# Patient Record
Sex: Female | Born: 1980 | Race: White | Hispanic: No | Smoking: Former smoker
Health system: Southern US, Community
[De-identification: ages and names within clinical notes are randomized; demographics above are authoritative.]

## PROBLEM LIST (undated history)

## (undated) DIAGNOSIS — Z720 Tobacco use: Secondary | ICD-10-CM

## (undated) DIAGNOSIS — G43909 Migraine, unspecified, not intractable, without status migrainosus: Secondary | ICD-10-CM

## (undated) DIAGNOSIS — F410 Panic disorder [episodic paroxysmal anxiety] without agoraphobia: Secondary | ICD-10-CM

## (undated) HISTORY — PX: ELBOW LIGAMENT RECONSTRUCTION: SHX6359

## (undated) HISTORY — DX: Tobacco use: Z72.0

---
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
## 2001-04-18 ENCOUNTER — Other Ambulatory Visit: Admission: RE | Admit: 2001-04-18 | Discharge: 2001-04-18 | Payer: Self-pay | Admitting: Internal Medicine

## 2002-06-06 ENCOUNTER — Encounter: Payer: Self-pay | Admitting: Family Medicine

## 2002-06-06 ENCOUNTER — Other Ambulatory Visit: Admission: RE | Admit: 2002-06-06 | Discharge: 2002-06-06 | Payer: Self-pay | Admitting: Family Medicine

## 2002-06-06 LAB — CONVERTED CEMR LAB: Pap Smear: NORMAL

## 2002-10-06 ENCOUNTER — Emergency Department (HOSPITAL_COMMUNITY): Admission: EM | Admit: 2002-10-06 | Discharge: 2002-10-06 | Payer: Self-pay | Admitting: *Deleted

## 2005-01-26 ENCOUNTER — Ambulatory Visit: Payer: Self-pay | Admitting: Family Medicine

## 2005-01-30 HISTORY — PX: OTHER SURGICAL HISTORY: SHX169

## 2005-01-31 ENCOUNTER — Encounter: Admission: RE | Admit: 2005-01-31 | Discharge: 2005-01-31 | Payer: Self-pay | Admitting: Family Medicine

## 2005-02-10 ENCOUNTER — Encounter (INDEPENDENT_AMBULATORY_CARE_PROVIDER_SITE_OTHER): Payer: Self-pay | Admitting: Specialist

## 2005-02-10 ENCOUNTER — Ambulatory Visit (HOSPITAL_COMMUNITY): Admission: RE | Admit: 2005-02-10 | Discharge: 2005-02-10 | Payer: Self-pay | Admitting: Gynecology

## 2006-03-19 ENCOUNTER — Ambulatory Visit: Payer: Self-pay | Admitting: Family Medicine

## 2006-03-20 ENCOUNTER — Encounter: Payer: Self-pay | Admitting: Family Medicine

## 2006-07-17 ENCOUNTER — Telehealth (INDEPENDENT_AMBULATORY_CARE_PROVIDER_SITE_OTHER): Payer: Self-pay | Admitting: *Deleted

## 2006-10-11 ENCOUNTER — Ambulatory Visit: Payer: Self-pay | Admitting: Family Medicine

## 2006-10-11 LAB — CONVERTED CEMR LAB
Beta hcg, urine, semiquantitative: NEGATIVE
Nitrite: NEGATIVE
Urobilinogen, UA: 0.2
Yeast, UA: 0
pH: 6.5

## 2006-10-12 ENCOUNTER — Encounter: Payer: Self-pay | Admitting: Family Medicine

## 2006-10-12 LAB — CONVERTED CEMR LAB: GC Probe Amp, Genital: NEGATIVE

## 2007-05-23 ENCOUNTER — Telehealth: Payer: Self-pay | Admitting: Family Medicine

## 2007-05-24 ENCOUNTER — Ambulatory Visit: Payer: Self-pay | Admitting: Family Medicine

## 2007-05-24 ENCOUNTER — Encounter (INDEPENDENT_AMBULATORY_CARE_PROVIDER_SITE_OTHER): Payer: Self-pay | Admitting: *Deleted

## 2007-05-31 LAB — CONVERTED CEMR LAB
ALT: 10 units/L (ref 0–35)
AST: 15 units/L (ref 0–37)
Basophils Relative: 1 % (ref 0–1)
Bilirubin, Direct: 0.1 mg/dL (ref 0.0–0.3)
Chloride: 104 meq/L (ref 96–112)
Creatinine, Ser: 0.82 mg/dL (ref 0.40–1.20)
Eosinophils Relative: 2 % (ref 0–5)
Glucose, Bld: 73 mg/dL (ref 70–99)
Hemoglobin: 15.7 g/dL — ABNORMAL HIGH (ref 12.0–15.0)
Lymphs Abs: 2.1 10*3/uL (ref 0.7–4.0)
MCV: 96.3 fL (ref 78.0–100.0)
Potassium: 5.1 meq/L (ref 3.5–5.3)
RDW: 13 % (ref 11.5–15.5)
Total Protein: 7.8 g/dL (ref 6.0–8.3)

## 2007-07-25 ENCOUNTER — Ambulatory Visit: Payer: Self-pay | Admitting: Family Medicine

## 2007-07-25 DIAGNOSIS — M6740 Ganglion, unspecified site: Secondary | ICD-10-CM | POA: Insufficient documentation

## 2007-09-26 ENCOUNTER — Ambulatory Visit: Payer: Self-pay | Admitting: Gynecology

## 2008-04-28 ENCOUNTER — Emergency Department (HOSPITAL_COMMUNITY): Admission: EM | Admit: 2008-04-28 | Discharge: 2008-04-28 | Payer: Self-pay | Admitting: Emergency Medicine

## 2008-06-30 ENCOUNTER — Ambulatory Visit: Payer: Self-pay | Admitting: Family Medicine

## 2008-06-30 DIAGNOSIS — F341 Dysthymic disorder: Secondary | ICD-10-CM | POA: Insufficient documentation

## 2009-08-23 ENCOUNTER — Ambulatory Visit: Payer: Self-pay | Admitting: Family Medicine

## 2009-08-23 LAB — CONVERTED CEMR LAB
Beta hcg, urine, semiquantitative: NEGATIVE
Bilirubin Urine: NEGATIVE
Ketones, urine, test strip: NEGATIVE

## 2009-08-24 ENCOUNTER — Encounter: Payer: Self-pay | Admitting: Family Medicine

## 2009-08-25 ENCOUNTER — Telehealth: Payer: Self-pay | Admitting: Family Medicine

## 2009-09-02 ENCOUNTER — Telehealth: Payer: Self-pay | Admitting: Family Medicine

## 2009-09-20 ENCOUNTER — Telehealth: Payer: Self-pay | Admitting: Family Medicine

## 2009-09-22 ENCOUNTER — Emergency Department (HOSPITAL_COMMUNITY): Admission: EM | Admit: 2009-09-22 | Discharge: 2009-09-22 | Payer: Self-pay | Admitting: Emergency Medicine

## 2009-09-22 ENCOUNTER — Telehealth: Payer: Self-pay | Admitting: Family Medicine

## 2010-02-16 ENCOUNTER — Encounter: Payer: Self-pay | Admitting: Family Medicine

## 2010-02-16 ENCOUNTER — Ambulatory Visit
Admission: RE | Admit: 2010-02-16 | Discharge: 2010-02-16 | Payer: Self-pay | Source: Home / Self Care | Attending: Family Medicine | Admitting: Family Medicine

## 2010-02-16 DIAGNOSIS — N390 Urinary tract infection, site not specified: Secondary | ICD-10-CM | POA: Insufficient documentation

## 2010-02-16 DIAGNOSIS — M549 Dorsalgia, unspecified: Secondary | ICD-10-CM | POA: Insufficient documentation

## 2010-02-16 LAB — CONVERTED CEMR LAB
Beta hcg, urine, semiquantitative: NEGATIVE
Casts: 0 /lpf
Nitrite: POSITIVE
Protein, U semiquant: 30
Urobilinogen, UA: 0.2
Yeast, UA: 0
pH: 6

## 2010-02-20 ENCOUNTER — Encounter: Payer: Self-pay | Admitting: Family Medicine

## 2010-02-28 ENCOUNTER — Ambulatory Visit
Admission: RE | Admit: 2010-02-28 | Discharge: 2010-02-28 | Payer: Self-pay | Source: Home / Self Care | Attending: Family Medicine | Admitting: Family Medicine

## 2010-02-28 ENCOUNTER — Encounter: Payer: Self-pay | Admitting: Family Medicine

## 2010-02-28 ENCOUNTER — Telehealth: Payer: Self-pay | Admitting: Family Medicine

## 2010-02-28 LAB — CONVERTED CEMR LAB
Bilirubin Urine: NEGATIVE
Ketones, urine, test strip: NEGATIVE
Specific Gravity, Urine: 1.015
Urobilinogen, UA: 0.2
WBC Urine, dipstick: NEGATIVE
pH: 6
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
## 2010-03-01 NOTE — Progress Notes (Signed)
Summary: still in pain, still bleeding  Phone Note Refill Request Call back at Home Phone [phone] Message from:  Patient on September 02, 2009 10:59 AM  Refills Requested: Medication #1:  VICODIN 5-500 MG TABS 1-2 by mouth up to every 4 hours as needed pain Patient says that she is still on her period and she is still having the pain and is out of pain medication. She is aslo very concerned that she is still bleeding because it has been going on for over a week. Please advise. [facility] [address]   Initial call taken by: Melody Comas,  September 02, 2009 10:59 AM Caller: Patient Call For: Judith Part MD  Follow-up for Phone Call        I still do not have ultrasound report -- that will help me I gave her a refil on vicodin -- did she use that? (gaver 30 with one refil on 7/27) Follow-up by: Judith Part MD,  September 02, 2009 12:59 PM  Additional Follow-up for Phone Call Additional follow up Details #1::        I called [place] [facility] with Dondra Spry. Dondra Spry said pt did not get [place] on Fri 08/24/09. I called pt and pt said she was told at [facility] could not do pelvic ultrasound if bleeding so pt did not have US  done and it was not rescheduled yet.  Pt said since the 08/25/09 she has been taking 2 Vicodin every 3 1/2 to 4 hrs because the pain is so bad. Pt has 4 pills left out of the 60 called in on 08/25/09. Pt said sharp pain  still in Lt side of lower back. I spoke with Dondra Spry at [facility] imaging and she said tech usually leaves it up to pt if bleeding if they want to get [place] or come back in few days after finish period. Please advise. Lewanda Rife LPN  September 02, 2009 2:01 PM     Additional Follow-up for Phone Call Additional follow up Details #2::    please let her know I really need to get the ultrasound when able to see what is going on please have her re schedule as soon as her period stops / or I can directly ref her to gyn if pain is that bad  px written on EMR for call in  do not  take vicodin more than every  4 hours- that can be toxic to the liver Follow-up by: Judith Part MD,  September 02, 2009 4:00 PM  Additional Follow-up for Phone Call Additional follow up Details #3:: Details for Additional Follow-up Action Taken: Patient notified as instructed via telephone.  [facility]   Additional Follow-up by: Linde Gillis CMA Duncan Dull),  September 02, 2009 4:11 PM  Prescriptions: VICODIN 5-500 MG TABS (HYDROCODONE-ACETAMINOPHEN) 1-2 by mouth up to every 4 hours as needed pain  #60 x 0   Entered and Authorized by:   Judith Part MD   Signed by:   Linde Gillis CMA (AAMA) on 09/02/2009   Method used:   Telephoned to ...       [facility]  [address] [id number]       [address]       [place], [place]  [address]       Ph: [phone]       Fax: [phone]  RxID:   [id number]

## 2010-03-01 NOTE — Assessment & Plan Note (Signed)
Summary: PAIN ON LEFT SIDE NEAR PELVIS/CLE   Vital Signs:  Patient profile:   30 year old female Height:      62.50 inches Weight:      133.75 pounds BMI:     24.16 Temp:     98 degrees F oral Pulse rate:   80 / minute Pulse rhythm:   regular BP sitting:   108 / 76  (left arm) Cuff size:   regular  Vitals Entered By: Lewanda Rife LPN (August 23, 2009 11:56 AM) CC: Pain in lt lower side and low back pain (slight). Pt pain level now is 9. Pt's LMP 08/05/09 but pt has been trying to get pregnant.   History of Present Illness: pain started late friday night  started hurting in L pelvic area  took ibuprofen and tylenol- not much help worse this am, uncomfortable to wear her jeans   not on birth control - is trying to get pregnant this mo menses was from the 7-12 th   has hx of ovarian cyst - had surgical removal Dr Maebelle Munroe -- was the L side  did save the ovary  that pain was quite a bit worse  no vaginal bleeding  no urinary symptoms - no dysuria or odor or blood , no frequency   never had a kidney stone in the past  pain does radiate into her lower back   no chance STD at all   no vag d/c or itch or burn   Allergies (verified): No Known Drug Allergies  Past History:  Past Surgical History: Last updated: 10/11/2006 Cystic ovarian mass (01/2005)  Family History: Last updated: 10/11/2006 Father: HTN Mother:  Siblings: 2 sisters  Social History: Last updated: 07/25/2007 Current Smoker Marital Status: Married Children:  Occupation: hairdresser  Risk Factors: Smoking Status: current (10/11/2006)  Review of Systems General:  Complains of fatigue; denies chills, fever, loss of appetite, and malaise. Eyes:  Denies blurring and eye irritation. CV:  Denies chest pain or discomfort and palpitations. Resp:  Denies cough and wheezing. GI:  Denies bloody stools, change in bowel habits, diarrhea, hemorrhoids, indigestion, loss of appetite, nausea, and  vomiting. Derm:  Denies poor wound healing and rash. Endo:  Denies excessive thirst and excessive urination. Heme:  Denies abnormal bruising and bleeding.  Physical Exam  General:  uncomfortable but well appearing Head:  normocephalic, atraumatic, and no abnormalities observed.   Eyes:  vision grossly intact, pupils equal, pupils round, and pupils reactive to light.   Neck:  No deformities, masses, or tenderness noted. Lungs:  Normal respiratory effort, chest expands symmetrically. Lungs are clear to auscultation, no crackles or wheezes. Heart:  Normal rate and regular rhythm. S1 and S2 normal without gallop, murmur, click, rub or other extra sounds. Abdomen:  tender over L pelvic area - without rebound or gaurding or rigidity no M noted  soft, normal bowel sounds, no hepatomegaly, and no splenomegaly.   Msk:  mild L cva tenderness  Extremities:  No clubbing, cyanosis, edema, or deformity noted with normal full range of motion of all joints.   Skin:  Intact without suspicious lesions or rashes Cervical Nodes:  No lymphadenopathy noted Inguinal Nodes:  No significant adenopathy Psych:  nl affect    Impression & Recommendations:  Problem # 1:  PELVIC PAIN, LEFT (ICD-789.09) Assessment New new pelvic pain with remote hx of large ov cyst that was removed sugically also pos ua for infx , preg test is neg  ref asap for pelvic  US  vicodin for pain  tx poss uti with cipro and cx urine  adv to update me if any worse esp if fever or inc pain  Her updated medication list for this problem includes:    Ibuprofen 200 Mg Tabs (Ibuprofen) ..... Otc as directed.    Vicodin 5-500 Mg Tabs (Hydrocodone-acetaminophen) .Marland Kitchen... 1 by mouth up to every 4 hours as needed pain  Orders: Radiology Referral (Radiology) Urine Pregnancy Test  (04540) UA Dipstick W/ Micro (manual) (98119)  Problem # 2:  UTI (ICD-599.0) Assessment: New pos ua without typical symptoms tx with high dose cipro in light of  back pain  ucx pend  if [place] is neg and pain not imp consider Ct to look for kidney stone Her updated medication list for this problem includes:    Cipro 500 Mg Tabs (Ciprofloxacin hcl) .Marland Kitchen... 1 by mouth two times a day for 7 days  Orders: T-Culture, Urine ([id number]) Specimen Handling (30865) Urine Pregnancy Test  (78469) UA Dipstick W/ Micro (manual) (81000)  Complete Medication List: 1)  Sertraline Hcl 25 Mg Tabs (Sertraline hcl) .... 1/2 tab by mouth once daily for 6 days, then whole tab for 6 days, then 2 tabs a day. 2)  Sertraline Hcl 50 Mg Tabs (Sertraline hcl) .... One tab by mouth once daily 3)  Ibuprofen 200 Mg Tabs (Ibuprofen) .... Otc as directed. 4)  Vicodin 5-500 Mg Tabs (Hydrocodone-acetaminophen) .Marland Kitchen.. 1 by mouth up to every 4 hours as needed pain 5)  Cipro 500 Mg Tabs (Ciprofloxacin hcl) .Marland Kitchen.. 1 by mouth two times a day for 7 days  Patient Instructions: 1)  we will refer you for pelvic ultrasound asap - at check out  2)  if your symptoms worsen- please call 3)  drink water 4)  take the abx - cipro - as directed  5)  also use vicodin with caution as it is narcotic and can sedate  Prescriptions: CIPRO 500 MG TABS (CIPROFLOXACIN HCL) 1 by mouth two times a day for 7 days  #14 x 0   Entered and Authorized by:   Judith Part MD   Signed by:   Judith Part MD on 08/23/2009   Method used:   Print then Give to Patient   RxID:   [id number] VICODIN 5-500 MG TABS (HYDROCODONE-ACETAMINOPHEN) 1 by mouth up to every 4 hours as needed pain  #20 x 0   Entered and Authorized by:   Judith Part MD   Signed by:   Judith Part MD on 08/23/2009   Method used:   Print then Give to Patient   RxID:   [id number]   Current Allergies (reviewed today): No known allergies   Laboratory Results   Urine Tests  Date/Time Received: August 23, 2009 11:58 AM  Date/Time Reported: August 23, 2009 11:58 AM   Routine Urinalysis   Color: yellow Appearance: Hazy Glucose:  negative   (Normal Range: Negative) Bilirubin: negative   (Normal Range: Negative) Ketone: negative   (Normal Range: Negative) Spec. Gravity: 1.015   (Normal Range: 1.003-1.035) Blood: trace-intact   (Normal Range: Negative) pH: 5.0   (Normal Range: 5.0-8.0) Protein: trace   (Normal Range: Negative) Urobilinogen: 0.2   (Normal Range: 0-1) Nitrite: positive   (Normal Range: Negative) Leukocyte Esterace: trace   (Normal Range: Negative)  Urine Microscopic WBC/HPF: 3-4 RBC/HPF: 1-3 Bacteria/HPF: mild Mucous/HPF: few Epithelial/HPF: 1-2 Crystals/HPF: 0 Casts/LPF: 0 Yeast/HPF: 0 Other: 0    Urine HCG: negative

## 2010-03-01 NOTE — Progress Notes (Signed)
Summary: hydrocodone not helping   Phone Note Call from Patient Call back at Home Phone [phone]   Caller: Patient Call For: Judith Part MD Summary of Call:  Patient says that the hydrocodone is not helping with her pain that she was seen for on the 25th. She is asking if she can have [facility] [address]  Initial call taken by: Melody Comas,  August 25, 2009 10:45 AM  Follow-up for Phone Call        have her try taking 2 instead of 1 up to every 4 hours (watch for sedation)- update me if not helpful  let me know if and when she needs refil as well could you please call for her pelvic US report? -I think she may have had it 7/26 at [facility]  let her know that prelim urine cx pos (no sens yet)-so keep taking the cipro also  Follow-up by: Judith Part MD,  August 25, 2009 12:08 PM  Additional Follow-up for Phone Call Additional follow up Details #1::        Patient notified as instructed by telephone. Pt will need [facility] rx [facility] [address] for [facility]one. Pt reschedule pelvic US for this Fri,08/27/09.Lewanda Rife LPN  August 25, 2009 12:14 PM     Additional Follow-up for Phone Call Additional follow up Details #2::    thanks px written on EMR for call in  Follow-up by: Judith Part MD,  August 25, 2009 12:18 PM  Additional Follow-up for Phone Call Additional follow up Details #3:: Details for Additional Follow-up Action Taken: [facility] as instructed. Lewanda Rife LPN  August 25, 2009 1:00 PM   New/Updated Medications: VICODIN 5-500 MG TABS (HYDROCODONE-ACETAMINOPHEN) 1-2 by mouth up to every 4 hours as needed pain Prescriptions: VICODIN 5-500 MG TABS (HYDROCODONE-ACETAMINOPHEN) 1-2 by mouth up to every 4 hours as needed pain  #30 x 1   Entered and Authorized by:   Judith Part MD   Signed by:   Lewanda Rife LPN on 47/82/9562   Method used:   Telephoned to ...         RxID:   [id number]

## 2010-03-01 NOTE — Progress Notes (Signed)
  Phone Note From Other Clinic   Summary of Call: Called patient and set her up an appt today with Dr Shawnie Pons today at 10:45am. Patient was a No-Show for this appt.  Their office will call the patient because she already had an appt on Sept  1st which  cynthia had made not knowing that I was already working on it.  I tried to call the patient back but their was no answer.  Initial call taken by: Carlton Adam,  September 22, 2009 2:32 PM  Follow-up for Phone Call        just do your best to contact her - thanks  Follow-up by: Judith Part MD,  September 22, 2009 4:21 PM

## 2010-03-02 ENCOUNTER — Encounter (INDEPENDENT_AMBULATORY_CARE_PROVIDER_SITE_OTHER): Payer: Self-pay | Admitting: *Deleted

## 2010-03-03 NOTE — Assessment & Plan Note (Signed)
Summary: side pain/alc   Vital Signs:  Patient profile:   30 year old female Weight:      132.25 pounds BMI:     23.89 Temp:     99.5 degrees F oral Pulse rate:   88 / minute Pulse rhythm:   regular BP sitting:   122 / 82  (left arm) Cuff size:   regular  Vitals Entered By: Selena Batten Dance CMA (AAMA) (February 16, 2010 3:05 PM) CC: Back and side pain x2 weeks Comments Hurts with movements, deep breaths, coughing and sneezing. OTC meds no help.    History of Present Illness: has had 2 weeks of symptoms  started with some pain in low back both sides  this radiates up to her ribs on both sides  hurts really bad and worse to take a deep breath  never had this happen before   no fever   has been taking otc 800 mg ibuprofen and tylenol and advil and tylenol pm  ibuprofen helps just a little - not a lot    crying a lot due to pain  head feels hot in general  also headaches -- migraines   no n/v no pelvic pain   last summer ended up seeing GYN -- and having CT or MRI - told she had "air bubbles " in colon  never found out why she was bleeding so much   menses have been fairly regular  occ irreg if really stressed  no missed peroids  last month was on the 20th -- is about due  no longer trying to get pregnant  has used condoms every time -- and not sexually active this month (split with her boyfrien)  has cut down to 5 cig per day  Allergies (verified): No Known Drug Allergies  Past History:  Past Surgical History: Last updated: 10/11/2006 Cystic ovarian mass (01/2005)  Family History: Last updated: 10/11/2006 Father: HTN Mother:  Siblings: 2 sisters  Social History: Last updated: 07/25/2007 Current Smoker Marital Status: Married Children:  Occupation: hairdresser  Risk Factors: Smoking Status: current (10/11/2006)  Past Medical History: tab abuse   Review of Systems General:  Complains of fatigue; denies chills and fever. Eyes:  Denies blurring. CV:   Denies chest pain or discomfort, palpitations, and shortness of breath with exertion. Resp:  Denies cough and wheezing. GI:  Complains of abdominal pain and nausea; denies bloody stools, change in bowel habits, dark tarry stools, indigestion, and vomiting. GU:  Denies dysuria and hematuria. MS:  Complains of low back pain. Derm:  Denies itching and rash. Neuro:  Denies headaches. Heme:  Denies abnormal bruising and bleeding.  Physical Exam  General:  healthy appearing but tearful from pain at times  moving slowly  Head:  normocephalic, atraumatic, and no abnormalities observed.   Eyes:  vision grossly intact, pupils equal, pupils round, and pupils reactive to light.   Mouth:  pharynx pink and moist.   Neck:  nl rom neck no bony tenderness  Chest Wall:  tender over bilat ant and lat chest wall - even to light palpation Lungs:  Normal respiratory effort, chest expands symmetrically. Lungs are clear to auscultation, no crackles or wheezes. pt winces in pain when she takes medium to deep breaths  Heart:  Normal rate and regular rhythm. S1 and S2 normal without gallop, murmur, click, rub or other extra sounds. Abdomen:  Bowel sounds positive,abdomen soft and non-tender without masses, organomegaly or hernias noted. pt is gaurding a bit on exam -  afraid I will touch her ribs  Msk:  very tender CW tender over entire LS and lower TS as well as surrounding musculature  tender cva - jumps to be touched  slr causes low back and hip pain  hip rotaton does the same Extremities:  No clubbing, cyanosis, edema, or deformity noted with normal full range of motion of all joints.   Neurologic:  sensation intact to light touch, gait normal, and DTRs symmetrical and normal.  no tremor   Skin:  Intact without suspicious lesions or rashes Cervical Nodes:  No lymphadenopathy noted Inguinal Nodes:  No significant adenopathy Psych:  pt seems somewhat anxious  also tearful from pain   Impression &  Recommendations:  Problem # 1:  BACK PAIN (ICD-724.5) Assessment New  bilateral low back pain that is radiating up into flank and rib areas with pleuritic component  some component of muscle spasm and also poss from uti  will tx with flexeril and also tx uti and update The following medications were removed from the medication list:    Vicodin 5-500 Mg Tabs (Hydrocodone-acetaminophen) .Marland Kitchen... 1-2 by mouth up to every 4 hours as needed pain Her updated medication list for this problem includes:    Ibuprofen 200 Mg Tabs (Ibuprofen) ..... Otc as directed.    Flexeril 10 Mg Tabs (Cyclobenzaprine hcl) .Marland Kitchen... 1 by mouth three times a day as needed back pain  caution- can sedate  Orders: UA Dipstick w/Micro (automated) (81001) Specimen Handling (16109)  Problem # 2:  UTI (ICD-599.0) Assessment: New with pos ua and low back and flank pain- but no urinary symptoms  also no gyn d/c or other symptoms  upreg neg as well tx with high dose cipro and update  The following medications were removed from the medication list:    Cipro 500 Mg Tabs (Ciprofloxacin hcl) .Marland Kitchen... 1 by mouth two times a day for 7 days Her updated medication list for this problem includes:    Cipro 500 Mg Tabs (Ciprofloxacin hcl) .Marland Kitchen... 1 by mouth two times a day for 7 days  Orders: UA Dipstick w/Micro (automated) (81001) T-Culture, Urine ([id number]) Specimen Handling (91478)  Complete Medication List: 1)  Ibuprofen 200 Mg Tabs (Ibuprofen) .... Otc as directed. 2)  Cipro 500 Mg Tabs (Ciprofloxacin hcl) .Marland Kitchen.. 1 by mouth two times a day for 7 days 3)  Flexeril 10 Mg Tabs (Cyclobenzaprine hcl) .Marland Kitchen.. 1 by mouth three times a day as needed back pain  caution- can sedate  Patient Instructions: 1)  continue drinking lots of water 2)  call or seek care is symptoms don't improve in 2-3 days or if you develop back pain, nausea, or vomiting  3)  take the cipro as directed  4)  call in 5-7 days to let me know how you are doing 5)  if  worse , call earlier  Prescriptions: FLEXERIL 10 MG TABS (CYCLOBENZAPRINE HCL) 1 by mouth three times a day as needed back pain  caution- can sedate  #30 x 0   Entered and Authorized by:   Judith Part MD   Signed by:   Judith Part MD on 02/16/2010   Method used:   [facility]        [facility] [facility]       [address]       [place], [place]  [address]       Ph: [phone] or [phone]       Fax: [phone]  RxID:   [id number] CIPRO 500 MG TABS (CIPROFLOXACIN HCL) 1 by mouth two times a day for 7 days  #14 x 0   Entered and Authorized by:   Judith Part MD   Signed by:   Judith Part MD on 02/16/2010   Method used:   [facility] to        [facility] [id number]       [address]       [place], [place]  [address]       Ph: [phone] or [phone]       Fax: [phone]   RxID:   [id number]    Orders Added: 1)  UA Dipstick w/Micro (automated) [81001] 2)  T-Culture, Urine [[id number]] 3)  Specimen Handling [99000] 4)  Est. Patient Level III [66063]    Current Allergies (reviewed today): No known allergies   Laboratory Results   Urine Tests  Date/Time Received: February 16, 2010 3:35 PM  Date/Time Reported: February 16, 2010 3:35 PM   Routine Urinalysis   Color: yellow Appearance: Cloudy Glucose: negative   (Normal Range: Negative) Bilirubin: negative   (Normal Range: Negative) Ketone: negative   (Normal Range: Negative) Spec. Gravity: 1.010   (Normal Range: 1.003-1.035) Blood: moderate   (Normal Range: Negative) pH: 6.0   (Normal Range: 5.0-8.0) Protein: 30   (Normal Range: Negative) Urobilinogen: 0.2   (Normal Range: 0-1) Nitrite: positive   (Normal Range: Negative) Leukocyte Esterace: moderate   (Normal Range: Negative)  Urine Microscopic WBC/HPF: many RBC/HPF: 2-3 Bacteria/HPF: many Mucous/HPF: few Epithelial/HPF: 0-3 Crystals/HPF: 0 Casts/LPF: 0 Yeast/HPF: 0 Other:  0    Urine HCG: negative

## 2010-03-09 NOTE — Progress Notes (Signed)
Summary: pain is not any better  Phone Note Call from Patient Call back at [phone]   Caller: Patient Call For: Judith Part MD Summary of Call: Pt was seen last week for side and back pain.  She is still having the pain, not any better.  She gets some relief from muscle relaxors but she cant take these during the day because they make her sleepy.  She has finished the antibiotic.  Please advise on what she can do. Initial call taken by: Lowella Petties CMA, AAMA,  February 28, 2010 9:47 AM  Follow-up for Phone Call        first and foremost I want to re check her ua -- please drop off specimen today and I will give further instructions  thanks Follow-up by: Judith Part MD,  February 28, 2010 10:50 AM  Additional Follow-up for Phone Call Additional follow up Details #1::        Left message for patient to call back. Lewanda Rife LPN  February 28, 2010 1:12 PM   Patient returned call and will come in this afternoon to give urine specimen.  Melody Comas  February 28, 2010 1:32 PM  see lab note 02/28/10 as nurse visit.Lewanda Rife LPN  February 28, 2010 5:16 PM

## 2010-03-09 NOTE — Letter (Signed)
Summary: Results Follow up Letter  [place] [facility]  [address] [place], [place] [address]   Phone: [phone]  Fax: [phone]    03/02/2010 MRN: [id number]  BREANDA GREENLAW [facility] [address] [place], [place]  [address]  Dear Ms. MOORE,  The following are the results of your recent test(s):  Test         Result    Pap Smear:        Normal _____  Not Normal _____ Comments: ______________________________________________________ Cholesterol: LDL(Bad cholesterol):         Your goal is less than:         HDL (Good cholesterol):       Your goal is more than: Comments:  ______________________________________________________ Mammogram:        Normal _____  Not Normal _____ Comments:  ___________________________________________________________________ Hemoccult:        Normal _____  Not normal _______ Comments:    _____________________________________________________________________ Other Tests: Your urine culture came back negative. Please follow up when you are able if the abdominal pain continues. Also, please contact our office with an updated contact number for you. All of the numbers we have in our system are incorrect so we have no way to call you should the need arise. Thanks!    We routinely do not discuss normal results over the telephone.  If you desire a copy of the results, or you have any questions about this information we can discuss them at your next office visit.   Sincerely,       Sharilyn Sites for Dr. Roxy Manns

## 2010-04-15 LAB — COMPREHENSIVE METABOLIC PANEL
Albumin: 4.1 g/dL (ref 3.5–5.2)
BUN: 7 mg/dL (ref 6–23)
CO2: 26 mEq/L (ref 19–32)
Chloride: 103 mEq/L (ref 96–112)
Creatinine, Ser: 0.91 mg/dL (ref 0.4–1.2)
Total Bilirubin: 0.2 mg/dL — ABNORMAL LOW (ref 0.3–1.2)
Total Protein: 7.3 g/dL (ref 6.0–8.3)

## 2010-04-15 LAB — CBC
HCT: 45 % (ref 36.0–46.0)
HCT: 45.1 % (ref 36.0–46.0)
Hemoglobin: 15.8 g/dL — ABNORMAL HIGH (ref 12.0–15.0)
Hemoglobin: 15.9 g/dL — ABNORMAL HIGH (ref 12.0–15.0)
MCH: 33.5 pg (ref 26.0–34.0)
MCV: 94.9 fL (ref 78.0–100.0)
MCV: 95.1 fL (ref 78.0–100.0)
RBC: 4.75 MIL/uL (ref 3.87–5.11)
RDW: 12.7 % (ref 11.5–15.5)
WBC: 3.2 10*3/uL — ABNORMAL LOW (ref 4.0–10.5)
WBC: 3.2 10*3/uL — ABNORMAL LOW (ref 4.0–10.5)

## 2010-04-15 LAB — LIPASE, BLOOD: Lipase: 19 U/L (ref 11–59)

## 2010-04-15 LAB — URINALYSIS, ROUTINE W REFLEX MICROSCOPIC
Glucose, UA: NEGATIVE mg/dL
Protein, ur: NEGATIVE mg/dL
Specific Gravity, Urine: 1.037 — ABNORMAL HIGH (ref 1.005–1.030)

## 2010-04-15 LAB — POCT URINALYSIS DIPSTICK
Glucose, UA: NEGATIVE mg/dL
Hgb urine dipstick: NEGATIVE
Specific Gravity, Urine: 1.025 (ref 1.005–1.030)
Urobilinogen, UA: 0.2 mg/dL (ref 0.0–1.0)

## 2010-04-15 LAB — DIFFERENTIAL
Eosinophils Absolute: 0 10*3/uL (ref 0.0–0.7)
Eosinophils Absolute: 0 10*3/uL (ref 0.0–0.7)
Eosinophils Relative: 1 % (ref 0–5)
Eosinophils Relative: 1 % (ref 0–5)
Lymphocytes Relative: 36 % (ref 12–46)
Lymphocytes Relative: 39 % (ref 12–46)
Lymphs Abs: 1.2 10*3/uL (ref 0.7–4.0)
Lymphs Abs: 1.3 10*3/uL (ref 0.7–4.0)
Monocytes Absolute: 0.5 10*3/uL (ref 0.1–1.0)
Monocytes Relative: 13 % — ABNORMAL HIGH (ref 3–12)
Neutro Abs: 1.4 10*3/uL — ABNORMAL LOW (ref 1.7–7.7)

## 2010-04-15 LAB — POCT I-STAT, CHEM 8
Glucose, Bld: 77 mg/dL (ref 70–99)
HCT: 52 % — ABNORMAL HIGH (ref 36.0–46.0)
Hemoglobin: 17.7 g/dL — ABNORMAL HIGH (ref 12.0–15.0)
Potassium: 4.1 mEq/L (ref 3.5–5.1)
Sodium: 139 mEq/L (ref 135–145)
TCO2: 28 mmol/L (ref 0–100)

## 2010-06-14 NOTE — Assessment & Plan Note (Signed)
NAME:  CAREL, SCHNEE             ACCOUNT NO.:  [id number]   MEDICAL RECORD NO.:  [id number]          PATIENT TYPE:  POB   LOCATION:  [facility] at [facility]         FACILITY:  [facility]   PHYSICIAN:  Ginger Carne, MD DATE OF BIRTH:  [birth year]   DATE OF SERVICE:                                  CLINIC NOTE   Ms. Christell Constant is here today as a 30 year old Caucasian female trying to  conceive over the past 6 months to a year.  Her menses are regular every  28 days and has regular intercourse.  Her partner is never had children  in the past and neither party has any known chronic medical diseases.  In January 2006, I perform a laparoscopic left ovarian cystectomy.  I  have asked the patient to be referred to [facility], [facility] in [place]  and she will be set up for same and she is amenable to this referral.  I  explained to her that infertility is a process were both female and female  carefully evaluated.           ______________________________  Ginger Carne, MD     SHB/MEDQ  D:  09/26/2007  T:  09/27/2007  Job:  [id number]

## 2010-06-17 NOTE — Op Note (Signed)
[name], [name]             ACCOUNT NO.:  [id number]   MEDICAL RECORD NO.:  [id number]          PATIENT TYPE:  AMB   LOCATION:  [facility]                           FACILITY:  [facility]   PHYSICIAN:  Ginger Carne, MD  DATE OF BIRTH:  1980-08-18   DATE OF PROCEDURE:  DATE OF DISCHARGE:                                 OPERATIVE REPORT   Audio too short to transcribe (less than 5 seconds)      Ginger Carne, MD     SHB/MEDQ  D:  02/10/2005  T:  02/10/2005  Job:  [id number]

## 2010-06-17 NOTE — Op Note (Signed)
Shannon Dyer, Shannon Dyer             ACCOUNT NO.:  [id number]   MEDICAL RECORD NO.:  [id number]          PATIENT TYPE:  AMB   LOCATION:  [facility]                           FACILITY:  [facility]   PHYSICIAN:  Ginger Carne, MD  DATE OF BIRTH:  11-Jun-1980   DATE OF PROCEDURE:  02/10/2005  DATE OF DISCHARGE:                                 OPERATIVE REPORT   PREOPERATIVE DIAGNOSIS:  Left ovarian cyst.   POSTOPERATIVE DIAGNOSIS:  Left ovarian cyst.   PROCEDURE:  Laparoscopic left ovarian cystectomy.   SURGEON:  Ginger Carne, M.D.   ASSISTANT:  None.   COMPLICATIONS:  None immediate.   ESTIMATED BLOOD LOSS:  Minimal.   SPECIMEN:  Left ovarian cyst.   OPERATIVE FINDINGS:  External genitalia, vulva and vagina normal.  Cervix  smooth without erosions or lesions.  The laparoscopic evaluation  demonstrated evidence of an 8 cm left ovarian mass.  After opening the  cortex on the antimesenteric portion of the ovary, a complex cyst was  identified and was removed.  The cyst contained a smooth surface; however,  the mesenteric portion of the cyst was somewhat solid.  No excrescences  noted.  The right ovary appeared normal.  No evidence of endometriosis or  pelvic inflammatory disease noted.  The ovary did have adhesive disease  between its pelvic sidewall and bowel, which was easy to dissect.  Appendix  was normal, upper abdomen normal, large and small bowel grossly normal.   OPERATIVE PROCEDURE:  The patient prepped and draped in the usual fashion  and placed in lithotomy position.  Betadine solution used for antiseptic and  the patient was catheterized prior to the procedure.  After adequate general  anesthesia, a tenaculum placed on the anterior lip of the cervix and a  Hickman tenaculum in the endocervical canal.  Afterwards a vertical  infraumbilical incision was made and the Veress needle placed in the  abdomen.  Opening and closing pressures were 10-15 mmHg and a medial release  trocar placed through the same incision, laparoscope placed in a trocar  sleeve.  Two 5 mm ports were made in the left lower quadrant and left  hypogastric regions under direct visualization.  The left ovary was grasped  and the antimesenteric border opened.  Using both water dissection with a  Nezhat irrigator as well as sharp dissection, said cyst was removed.  The  specimen to pathology.  Bleeding points meticulously hemostatically checked  in the ovary.  Copious irrigation with irrigant removed.  Afterwards gas  released, trocars removed.  Closure of 10 mm fascia site with 0 Vicryl  suture and 4-0 Vicryl for subcuticular closure.  Instrument and sponge count  were correct.  The patient tolerated the procedure well, returned to the  post anesthesia recovery room in excellent condition.      Ginger Carne, MD  Electronically Signed     SHB/MEDQ  D:  02/10/2005  T:  02/10/2005  Job:  [id number]

## 2010-08-04 ENCOUNTER — Ambulatory Visit (INDEPENDENT_AMBULATORY_CARE_PROVIDER_SITE_OTHER): Payer: Self-pay | Admitting: Family Medicine

## 2010-08-04 ENCOUNTER — Encounter: Payer: Self-pay | Admitting: Family Medicine

## 2010-08-04 VITALS — BP 114/72 | HR 88 | Temp 98.3°F | Ht 62.5 in | Wt 131.0 lb

## 2010-08-04 DIAGNOSIS — J029 Acute pharyngitis, unspecified: Secondary | ICD-10-CM

## 2010-08-04 DIAGNOSIS — J020 Streptococcal pharyngitis: Secondary | ICD-10-CM

## 2010-08-04 LAB — POCT RAPID STREP A (OFFICE): Rapid Strep A Screen: NEGATIVE

## 2010-08-04 MED ORDER — AMOXICILLIN 875 MG PO TABS: 875.0000 mg | ORAL_TABLET | Freq: Two times a day (BID) | ORAL | Status: AC

## 2010-08-04 MED ORDER — LIDOCAINE VISCOUS HCL 2 % MT SOLN: 5.0000 mL | OROMUCOSAL | Status: DC

## 2010-08-04 NOTE — Assessment & Plan Note (Signed)
Likely false neg RST.  D/w pt and I would start amoxil.  She understood.  Use lidocaine prn for throat.  Supportive care o/w.  She agrees.  Fu prn, out of work in meantime.  See work note.

## 2010-08-04 NOTE — Patient Instructions (Signed)
Start the amoxil today.  Use the lidocaine as needed for your sore throat.  Go back to work when your throat is better and you don't have any fevers.  Gargle with warm salt water in the meantime and take tylenol/motril for pain.  Take care.

## 2010-08-04 NOTE — Progress Notes (Signed)
ST for about 1 week.  Was gargling with warm salt water.  Now waking at night due to throat irritation.  Drinking liquids makes the throat sx better.  Prev thought her glands were swollen. No known fevers but felt warm.  No sweats.  No nasal sx.  R ear has been mildly tender.  Cough- worse at night.  No sputum.  In general, days are better than nights.  Voice change noted by patient.  The nocturnal sx are getting worse.  No known sick contacts.  Smoking but cutting down.  5 cigs a day now.    RST negative but I think this is a false negative.   Meds, vitals, and allergies reviewed.   ROS: See HPI.  Otherwise, noncontributory.  GEN: nad, alert and oriented HEENT: mucous membranes moist, tm w/o erythema, nasal exam w/o erythema, clear discharge noted,  OP with cobblestoning, + tonsillar enlargement with exudates bilaterally NECK: supple w/ tender LA CV: rrr.   PULM: ctab, no inc wob EXT: no edema SKIN: no acute rash

## 2011-02-01 ENCOUNTER — Encounter (HOSPITAL_COMMUNITY): Payer: Self-pay

## 2011-02-01 ENCOUNTER — Telehealth: Payer: Self-pay | Admitting: Family Medicine

## 2011-02-01 ENCOUNTER — Emergency Department (INDEPENDENT_AMBULATORY_CARE_PROVIDER_SITE_OTHER)
Admission: EM | Admit: 2011-02-01 | Discharge: 2011-02-01 | Disposition: A | Discharge status: Home / Self Care | Payer: Self-pay | Source: Home / Self Care

## 2011-02-01 DIAGNOSIS — M545 Low back pain, unspecified: Secondary | ICD-10-CM

## 2011-02-01 DIAGNOSIS — J209 Acute bronchitis, unspecified: Secondary | ICD-10-CM

## 2011-02-01 MED ORDER — AZITHROMYCIN 250 MG PO TABS: ORAL_TABLET | ORAL | Status: AC

## 2011-02-01 MED ORDER — IBUPROFEN 800 MG PO TABS: 800.0000 mg | ORAL_TABLET | Freq: Three times a day (TID) | ORAL | Status: AC

## 2011-02-01 MED ORDER — METHOCARBAMOL 750 MG PO TABS: 750.0000 mg | ORAL_TABLET | Freq: Four times a day (QID) | ORAL | Status: AC

## 2011-02-01 MED ORDER — GUAIFENESIN-CODEINE 100-10 MG/5ML PO SYRP: ORAL_SOLUTION | ORAL | Status: AC

## 2011-02-01 NOTE — Telephone Encounter (Signed)
Pt has sore throat, cough, no fever, back pains for a week, difficulty breathing with cough, green congestion.  Pt would like appt today if possible.  Please call at [phone]

## 2011-02-01 NOTE — ED Provider Notes (Signed)
History     CSN: [id number]  Arrival date & time 02/01/11  1142   None     Chief Complaint  Patient presents with  . URI    (Consider location/radiation/quality/duration/timing/severity/associated sxs/prior treatment) HPI Comments: Pt presents with chest congestion and cough x 1 week. Cough is sometimes productive with green phlegm. No dyspnea or wheezing. She denies nasal congestion, sore throat or fever. She also has been having LBP, worse with movement. No radiation of pain or parasthesias of LE. No trauma. She denies prior hx of LBP. She has tried various otc pain medications without improvement. She states the cough awakens her at night. And she has been taking otc cough meds without improvement.    Past Medical History  Diagnosis Date  . Tobacco abuse     Past Surgical History  Procedure Date  . Cystic ovarian mass 01/2005    Family History  Problem Relation Age of Onset  . Hypertension Father     History  Substance Use Topics  . Smoking status: Current Everyday Smoker  . Smokeless tobacco: Not on file  . Alcohol Use: Yes     social    OB History    Grav Para Term Preterm Abortions TAB SAB Ect Mult Living                  Review of Systems  Constitutional: Negative for fever, chills and fatigue.  HENT: Negative for ear pain, sore throat, rhinorrhea, sneezing, postnasal drip and sinus pressure.   Respiratory: Positive for cough. Negative for shortness of breath and wheezing.   Cardiovascular: Negative for chest pain.  Musculoskeletal: Positive for back pain.    Allergies  Review of patient's allergies indicates no known allergies.  Home Medications   Current Outpatient Rx  Name Route Sig Dispense Refill  . AZITHROMYCIN 250 MG PO TABS  take 2 tabs today, then 1 tab daily x 4 days 6 each 0  . GUAIFENESIN-CODEINE 100-10 MG/5ML PO SYRP  1-2 tsp every 6 hrs prn cough 120 mL 0  . IBUPROFEN 200 MG PO TABS Oral Take 200 mg by mouth every 6 (six) hours as  needed.      . IBUPROFEN 800 MG PO TABS Oral Take 1 tablet (800 mg total) by mouth 3 (three) times daily. 15 tablet 0  . LIDOCAINE HCL 2 % MT SOLN Mouth/Throat Use as directed 5 mLs in the mouth or throat every 4 (four) hours. 60 mL 1  . METHOCARBAMOL 750 MG PO TABS Oral Take 1 tablet (750 mg total) by mouth 4 (four) times daily. 20 tablet 0    BP 110/69  Pulse 73  Temp(Src) 98.2 F (36.8 C) (Oral)  Resp 16  SpO2 100%  Physical Exam  Nursing note and vitals reviewed. Constitutional: She appears well-developed and well-nourished. No distress.  HENT:  Head: Normocephalic and atraumatic.  Right Ear: Tympanic membrane, external ear and ear canal normal.  Left Ear: Tympanic membrane, external ear and ear canal normal.  Nose: Nose normal.  Mouth/Throat: Uvula is midline, oropharynx is clear and moist and mucous membranes are normal. No oropharyngeal exudate, posterior oropharyngeal edema or posterior oropharyngeal erythema.  Neck: Neck supple.  Cardiovascular: Normal rate, regular rhythm and normal heart sounds.   Pulmonary/Chest: Effort normal and breath sounds normal. No respiratory distress.  Musculoskeletal:       Lumbar back: She exhibits tenderness (Bilateral paraspinal muscular TTP, Lt > Rt. ). She exhibits normal range of motion, no bony tenderness,  no swelling, no edema, no deformity, no pain and no spasm.       Nontender SI joints and sciatic notches bilat.  Lymphadenopathy:    She has no cervical adenopathy.  Neurological: She is alert. She has normal strength. Gait normal.  Reflex Scores:      Patellar reflexes are 2+ on the right side and 2+ on the left side. Skin: Skin is warm and dry.  Psychiatric: She has a normal mood and affect.    ED Course  Procedures (including critical care time)  Labs Reviewed - No data to display No results found.   1. Acute bronchitis   2. Low back pain       MDM   Acute bronchitis and muscular LBP.        Melody Comas,  [place] 02/01/11 1534

## 2011-02-01 NOTE — ED Provider Notes (Signed)
Medical screening examination/treatment/procedure(s) were performed by non-physician practitioner and as supervising physician I was immediately available for consultation/collaboration.  Raynald Blend, MD 02/01/11 1715

## 2011-02-01 NOTE — Telephone Encounter (Signed)
Called [phone] and left v/m for pt to call back; the recording said Shannon Dyer with [employer] and the [phone] is not pts # and work # has been d/c.

## 2011-02-01 NOTE — Telephone Encounter (Signed)
Left vm for pt to callback 

## 2011-02-01 NOTE — ED Notes (Addendum)
States she has been having  cough for 1 week, hard to sleep due to cough and pain in her mid back; hoarse sounding; pin low to mid back, worse w palpation, no radiation of pain ; NAD Has  Been using dayquil /nyquil generic form w minimal relief

## 2011-02-02 NOTE — Telephone Encounter (Signed)
Tried other contact # to reach pt and was told they had the same #s as I do and cannot reach the pt. I left another v/m at [phone] for pt to call back.

## 2011-02-02 NOTE — Telephone Encounter (Signed)
Left vm for pt to callback 

## 2011-02-03 ENCOUNTER — Telehealth: Payer: Self-pay

## 2011-02-03 NOTE — Telephone Encounter (Signed)
Pt called and left v/m for me to call pt at [phone]. I called and left v/m for pt to call back. I also sent pt a letter today.

## 2011-02-03 NOTE — Telephone Encounter (Signed)
Still unable to reach pt by phone. I sent pt a letter which is in pts charts under letter tab. Pt was seen at [facility] on 02/01/11.

## 2011-02-06 NOTE — Telephone Encounter (Signed)
Left vm for pt to callback 

## 2011-02-07 NOTE — Telephone Encounter (Signed)
Left v/m for pt to call back. Tried work # and got pts boyfriend and he said to call [phone]; which I did and left v/m.

## 2011-02-09 NOTE — Telephone Encounter (Signed)
Left v/m for pt to call back if needed. Letter already mailed to pt.

## 2011-05-27 ENCOUNTER — Ambulatory Visit: Payer: Self-pay | Admitting: Internal Medicine

## 2011-05-27 ENCOUNTER — Ambulatory Visit: Payer: Self-pay

## 2011-05-27 DIAGNOSIS — H5712 Ocular pain, left eye: Secondary | ICD-10-CM

## 2011-05-27 DIAGNOSIS — S022XXA Fracture of nasal bones, initial encounter for closed fracture: Secondary | ICD-10-CM

## 2011-05-27 DIAGNOSIS — Z043 Encounter for examination and observation following other accident: Secondary | ICD-10-CM

## 2011-05-27 DIAGNOSIS — F22 Delusional disorders: Secondary | ICD-10-CM | POA: Insufficient documentation

## 2011-05-27 DIAGNOSIS — J3489 Other specified disorders of nose and nasal sinuses: Secondary | ICD-10-CM

## 2011-05-27 DIAGNOSIS — M546 Pain in thoracic spine: Secondary | ICD-10-CM

## 2011-05-27 DIAGNOSIS — G501 Atypical facial pain: Secondary | ICD-10-CM

## 2011-05-27 DIAGNOSIS — M542 Cervicalgia: Secondary | ICD-10-CM

## 2011-05-27 DIAGNOSIS — S20219A Contusion of unspecified front wall of thorax, initial encounter: Secondary | ICD-10-CM

## 2011-05-27 MED ORDER — HYDROCODONE-ACETAMINOPHEN 7.5-325 MG PO TABS: 1.0000 | ORAL_TABLET | Freq: Three times a day (TID) | ORAL | Status: AC | PRN

## 2011-05-27 MED ORDER — CYCLOBENZAPRINE HCL 10 MG PO TABS
10.0000 mg | ORAL_TABLET | Freq: Three times a day (TID) | ORAL | Status: AC | PRN
Start: 2011-05-27 — End: 2011-06-06
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
## 2011-05-27 NOTE — Patient Instructions (Signed)
Cervical Sprain A cervical sprain is an injury in the neck in which the ligaments are stretched or torn. The ligaments are the tissues that hold the bones of the neck (vertebrae) in place.Cervical sprains can range from very mild to very severe. Most cervical sprains get better in 1 to 3 weeks, but it depends on the cause and extent of the injury. Severe cervical sprains can cause the neck vertebrae to be unstable. This can lead to damage of the spinal cord and can result in serious nervous system problems. Your caregiver will determine whether your cervical sprain is mild or severe. CAUSES  Severe cervical sprains may be caused by:  Contact sport injuries (football, rugby, wrestling, hockey, auto racing, gymnastics, diving, martial arts, boxing).   Motor vehicle collisions.   Whiplash injuries. This means the neck is forcefully whipped backward and forward.   Falls.  Mild cervical sprains may be caused by:   Awkward positions, such as cradling a telephone between your ear and shoulder.   Sitting in a chair that does not offer proper support.   Working at a poorly designed computer station.   Activities that require looking up or down for long periods of time.  SYMPTOMS   Pain, soreness, stiffness, or a burning sensation in the front, back, or sides of the neck. This discomfort may develop immediately after injury or it may develop slowly and not begin for 24 hours or more after an injury.   Pain or tenderness directly in the middle of the back of the neck.   Shoulder or upper back pain.   Limited ability to move the neck.   Headache.   Dizziness.   Weakness, numbness, or tingling in the hands or arms.   Muscle spasms.   Difficulty swallowing or chewing.   Tenderness and swelling of the neck.  DIAGNOSIS  Most of the time, your caregiver can diagnose this problem by taking your history and doing a physical exam. Your caregiver will ask about any known problems, such as  arthritis in the neck or a previous neck injury. X-rays may be taken to find out if there are any other problems, such as problems with the bones of the neck. However, an X-ray often does not reveal the full extent of a cervical sprain. Other tests such as a computed tomography (CT) scan or magnetic resonance imaging (MRI) may be needed. TREATMENT  Treatment depends on the severity of the cervical sprain. Mild sprains can be treated with rest, keeping the neck in place (immobilization), and pain medicines. Severe cervical sprains need immediate immobilization and an appointment with an orthopedist or neurosurgeon. Several treatment options are available to help with pain, muscle spasms, and other symptoms. Your caregiver may prescribe:  Medicines, such as pain relievers, numbing medicines, or muscle relaxants.   Physical therapy. This can include stretching exercises, strengthening exercises, and posture training. Exercises and improved posture can help stabilize the neck, strengthen muscles, and help stop symptoms from returning.   A neck collar to be worn for short periods of time. Often, these collars are worn for comfort. However, certain collars may be worn to protect the neck and prevent further worsening of a serious cervical sprain.  HOME CARE INSTRUCTIONS   Put ice on the injured area.   Put ice in a plastic bag.   Place a towel between your skin and the bag.   Leave the ice on for 15 to 20 minutes, 3 to 4 times a day.     to 20 minutes, 3 to 4 times a day.   Only take over-the-counter or prescription medicines for pain, discomfort, or fever as directed by your caregiver.   Keep all follow-up appointments as directed by your caregiver.   Keep all physical therapy appointments as directed by your caregiver.   If a neck collar is prescribed, wear it as directed by your caregiver.   Do not drive while wearing a neck collar.   Make any needed adjustments to your work station to promote good posture.   Avoid positions and activities that make your  symptoms worse.   Warm up and stretch before being active to help prevent problems.  SEEK MEDICAL CARE IF:    Your pain is not controlled with medicine.   You are unable to decrease your pain medicine over time as planned.   Your activity level is not improving as expected.  SEEK IMMEDIATE MEDICAL CARE IF:    You develop any bleeding, stomach upset, or signs of an allergic reaction to your medicine.   Your symptoms get worse.   You develop new, unexplained symptoms.   You have numbness, tingling, weakness, or paralysis in any part of your body.  MAKE SURE YOU:    Understand these instructions.   Will watch your condition.   Will get help right away if you are not doing well or get worse.  Document Released: 11/13/2006 Document Revised: 01/05/2011 Document Reviewed: 10/19/2010  [employer] Patient Information 2012 [employer]  Head Injury, Adult  You have had a head injury that does not appear serious at this time. A concussion is a state of changed mental ability, usually from a blow to the head. You should take clear liquids for the rest of the day and then resume your regular diet. You should not take sedatives or alcoholic beverages for as long as directed by your caregiver after discharge. After injuries such as yours, most problems occur within the first 24 hours.  SYMPTOMS  These minor symptoms may be experienced after discharge:   Memory difficulties.   Dizziness.   Headaches.   Double vision.   Hearing difficulties.   Depression.   Tiredness.   Weakness.   Difficulty with concentration.  If you experience any of these problems, you should not be alarmed. A concussion requires a few days for recovery. Many patients with head injuries frequently experience such symptoms. Usually, these problems disappear without medical care. If symptoms last for more than one day, notify your caregiver. See your caregiver sooner if symptoms are becoming worse rather than better.  HOME CARE INSTRUCTIONS     During the next 24 hours you must stay with someone who can watch you for the warning signs listed below.  Although it is unlikely that serious side effects will occur, you should be aware of signs and symptoms which may necessitate your return to this location. Side effects may occur up to 7 - 10 days following the injury. It is important for you to carefully monitor your condition and contact your caregiver or seek immediate medical attention if there is a change in your condition.  SEEK IMMEDIATE MEDICAL CARE IF:    There is confusion or drowsiness.   You can not awaken the injured person.   There is nausea (feeling sick to your stomach) or continued, forceful vomiting.   You notice dizziness or unsteadiness which is getting worse, or inability to walk.   You have convulsions or unconsciousness.   You experience severe, persistent   headaches not relieved by over-the-counter or prescription medicines for pain. (Do not take aspirin as this impairs clotting abilities). Take other pain medications only as directed.   You can not use arms or legs normally.   There is clear or bloody discharge from the nose or ears.  MAKE SURE YOU:    Understand these instructions.   Will watch your condition.   Will get help right away if you are not doing well or get worse.  Document Released: 01/16/2005 Document Revised: 01/05/2011 Document Reviewed: 12/04/2008  [employer] Patient Information 2012 [employer]

## 2011-06-01 ENCOUNTER — Ambulatory Visit
Admission: RE | Admit: 2011-06-01 | Discharge: 2011-06-01 | Disposition: A | Discharge status: Home / Self Care | Payer: No Typology Code available for payment source | Source: Ambulatory Visit | Attending: Internal Medicine | Admitting: Internal Medicine

## 2011-06-01 ENCOUNTER — Ambulatory Visit (INDEPENDENT_AMBULATORY_CARE_PROVIDER_SITE_OTHER): Payer: Self-pay | Admitting: Internal Medicine

## 2011-06-01 ENCOUNTER — Telehealth: Payer: Self-pay

## 2011-06-01 DIAGNOSIS — R519 Headache, unspecified: Secondary | ICD-10-CM

## 2011-06-01 DIAGNOSIS — G8929 Other chronic pain: Secondary | ICD-10-CM

## 2011-06-01 DIAGNOSIS — R079 Chest pain, unspecified: Secondary | ICD-10-CM

## 2011-06-01 DIAGNOSIS — Z043 Encounter for examination and observation following other accident: Secondary | ICD-10-CM

## 2011-06-01 DIAGNOSIS — R51 Headache: Secondary | ICD-10-CM

## 2011-06-01 DIAGNOSIS — M2550 Pain in unspecified joint: Secondary | ICD-10-CM

## 2011-06-01 MED ORDER — HYDROCODONE-ACETAMINOPHEN 7.5-325 MG PO TABS: 1.0000 | ORAL_TABLET | Freq: Three times a day (TID) | ORAL | Status: AC | PRN
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
## 2011-06-01 NOTE — Telephone Encounter (Signed)
.  umfc The patient called to say that Dr. Perrin Maltese told her that he would write her a Rx for a stronger dose of hydrocodone, but gave her the same dose as the Rx from 05/27/11.  Please call patient at [phone].

## 2011-06-01 NOTE — Telephone Encounter (Signed)
Dr. Roxan Diesel:  The medication for the hydrocodone that was given to pt today was the same dose.  Advised pt to take 1 and 1/2 tablet as discussed during visit.

## 2011-06-08 ENCOUNTER — Telehealth: Payer: Self-pay

## 2011-06-08 NOTE — Telephone Encounter (Signed)
Spok

## 2012-05-22 ENCOUNTER — Other Ambulatory Visit (HOSPITAL_COMMUNITY)
Admission: RE | Admit: 2012-05-22 | Discharge: 2012-05-22 | Disposition: A | Discharge status: Home / Self Care | Payer: BC Managed Care – PPO | Source: Ambulatory Visit | Attending: Family Medicine | Admitting: Family Medicine

## 2012-05-22 ENCOUNTER — Encounter: Payer: Self-pay | Admitting: Family Medicine

## 2012-05-22 ENCOUNTER — Ambulatory Visit (INDEPENDENT_AMBULATORY_CARE_PROVIDER_SITE_OTHER): Payer: BC Managed Care – PPO | Admitting: Family Medicine

## 2012-05-22 VITALS — BP 106/76 | HR 82 | Temp 98.9°F | Ht 62.0 in | Wt 132.5 lb

## 2012-05-22 DIAGNOSIS — Z01419 Encounter for gynecological examination (general) (routine) without abnormal findings: Secondary | ICD-10-CM | POA: Insufficient documentation

## 2012-05-22 DIAGNOSIS — R87810 Cervical high risk human papillomavirus (HPV) DNA test positive: Secondary | ICD-10-CM | POA: Insufficient documentation

## 2012-05-22 DIAGNOSIS — Z1151 Encounter for screening for human papillomavirus (HPV): Secondary | ICD-10-CM | POA: Insufficient documentation

## 2012-05-22 DIAGNOSIS — N979 Female infertility, unspecified: Secondary | ICD-10-CM

## 2012-05-22 DIAGNOSIS — N912 Amenorrhea, unspecified: Secondary | ICD-10-CM

## 2012-05-22 LAB — POCT URINE PREGNANCY: Preg Test, Ur: NEGATIVE

## 2012-05-22 NOTE — Patient Instructions (Addendum)
Pap done today  Normal exam If you get a cyst - use warm compresses early to get it gone  Keep working on quitting smoking Lab today We will update you when they return

## 2012-05-22 NOTE — Progress Notes (Signed)
Subjective:    Patient ID: Shannon Dyer, female    DOB: April 12, 1980, 32 y.o.   MRN: [id number]  HPI Here for missed menses Very light period in feb - was lighter than usual  None since  Is trying to conceive  Used to be regular  Now hit or miss a bit more   Was on OC when she was younger  Got large cyst on L ovary - then surgery (still has ovary)   Wt is down about 8 lb  Has been eating right and exercising   Is a smoker - down to 4 cig per day- and plans to quit soon! - motivated   Last pap was a while ago  Is overdue  Gets recurrent cyst on L labia that will often drain - now it is gone No worries about STDs  She is taking pnv  Patient Active Problem List  Diagnosis  . ANXIETY DEPRESSION  . GANGLION CYST, WRIST, LEFT  . BACK PAIN  . Strep throat  . Encounter for routine gynecological examination  . Amenorrhea  . 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 problem   Past Medical History  Diagnosis Date  . Tobacco abuse    Past Surgical History  Procedure Laterality Date  . 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 Smoking status: Current Every Day Smoker -- 0.25 packs/day  . Smokeless tobacco: Not on file  . Alcohol Use: Yes     Comment: social   Family History  Problem Relation Age of Onset  . Hypertension Father    No Known Allergies No current outpatient prescriptions on file prior to visit.   No current facility-administered medications on file prior to visit.     Review of Systems Review of Systems  Constitutional: Negative for fever, appetite change, fatigue and unexpected weight change.  Eyes: Negative for pain and visual disturbance.  Respiratory: Negative for cough and shortness of breath.   Cardiovascular: Negative for cp or palpitations    Gastrointestinal: Negative for nausea, diarrhea and constipation.  Genitourinary: Negative for urgency and frequency. neg for pelvic pain or cramping or vaginal d/c Skin: Negative for pallor or  rash   Neurological: Negative for weakness, light-headedness, numbness and headaches.  Hematological: Negative for adenopathy. Does not bruise/bleed easily.  Psychiatric/Behavioral: Negative for dysphoric mood. The patient is not nervous/anxious.         Objective:   Physical Exam  Constitutional: She appears well-developed and well-nourished. No distress.  HENT:  Head: Normocephalic and atraumatic.  Mouth/Throat: Oropharynx is clear and moist.  Eyes: Conjunctivae and EOM are normal. Pupils are equal, round, and reactive to light. Right eye exhibits no discharge. Left eye exhibits no discharge. No scleral icterus.  Neck: Normal range of motion. Neck supple. No JVD present. Carotid bruit is not present. No thyromegaly present.  Cardiovascular: Normal rate and regular rhythm.   Pulmonary/Chest: Effort normal and breath sounds normal. No respiratory distress. She has no wheezes.  Abdominal: Soft. Bowel sounds are normal. She exhibits no distension, no abdominal bruit and no mass. There is no tenderness.  No suprapubic tenderness or fullness    Genitourinary: Vagina normal and uterus normal. No breast swelling, tenderness, discharge or bleeding. There is no rash, tenderness or lesion on the right labia. There is no rash, tenderness or lesion on the left labia. Uterus is not enlarged and not tender. Cervix exhibits no motion tenderness, no discharge and no friability. Right adnexum displays no mass, no tenderness  and no fullness. Left adnexum displays no mass, no tenderness and no fullness. No tenderness or bleeding around the vagina. No vaginal discharge found.  Breast exam: No mass, nodules, thickening, tenderness, bulging, retraction, inflamation, nipple discharge or skin changes noted.  No axillary or clavicular LA.  Chaperoned exam.    Musculoskeletal: She exhibits no edema.  Lymphadenopathy:    She has no cervical adenopathy.  Neurological: She is alert. She has normal reflexes.  Skin: Skin  is warm and dry. No rash noted. No erythema. No pallor.  Psychiatric: She has a normal mood and affect.          Assessment & Plan:

## 2012-05-22 NOTE — Assessment & Plan Note (Signed)
Suspect anovulation Lab today  pcos in differential

## 2012-05-22 NOTE — Assessment & Plan Note (Signed)
Exam with pap today

## 2012-05-22 NOTE — Assessment & Plan Note (Signed)
Lab today- then consider progesterone challenge Poss not ovulating Hx of ovarian cyst ? If pcos

## 2012-05-23 LAB — TSH: TSH: 1.55 u[IU]/mL (ref 0.35–5.50)

## 2012-05-23 LAB — PROLACTIN: Prolactin: 11.5 ng/mL

## 2012-05-27 ENCOUNTER — Telehealth: Payer: Self-pay | Admitting: Family Medicine

## 2012-05-27 DIAGNOSIS — N926 Irregular menstruation, unspecified: Secondary | ICD-10-CM

## 2012-05-27 DIAGNOSIS — N979 Female infertility, unspecified: Secondary | ICD-10-CM

## 2012-05-27 NOTE — Telephone Encounter (Signed)
Message copied by Judy Pimple on Mon May 27, 2012  8:11 AM ------      Message from: Shon Millet      Created: Fri May 24, 2012  5:00 PM       See prev note ------

## 2012-05-27 NOTE — Telephone Encounter (Signed)
Ref to gyn for irreg menses and fertility issues

## 2012-05-29 ENCOUNTER — Encounter: Payer: Self-pay | Admitting: Family Medicine

## 2012-11-21 ENCOUNTER — Emergency Department (HOSPITAL_COMMUNITY): Payer: BC Managed Care – PPO

## 2012-11-21 ENCOUNTER — Encounter (HOSPITAL_COMMUNITY): Payer: Self-pay | Admitting: Emergency Medicine

## 2012-11-21 ENCOUNTER — Inpatient Hospital Stay (HOSPITAL_COMMUNITY)
Admission: EM | Admit: 2012-11-21 | Discharge: 2012-11-23 | DRG: 872 | Disposition: A | Discharge status: Home / Self Care | Payer: BC Managed Care – PPO | Attending: Internal Medicine | Admitting: Internal Medicine

## 2012-11-21 DIAGNOSIS — N10 Acute tubulo-interstitial nephritis: Secondary | ICD-10-CM | POA: Diagnosis present

## 2012-11-21 DIAGNOSIS — IMO0001 Reserved for inherently not codable concepts without codable children: Secondary | ICD-10-CM | POA: Diagnosis present

## 2012-11-21 DIAGNOSIS — F341 Dysthymic disorder: Secondary | ICD-10-CM | POA: Diagnosis present

## 2012-11-21 DIAGNOSIS — M791 Myalgia, unspecified site: Secondary | ICD-10-CM | POA: Diagnosis present

## 2012-11-21 DIAGNOSIS — N390 Urinary tract infection, site not specified: Secondary | ICD-10-CM | POA: Diagnosis present

## 2012-11-21 DIAGNOSIS — N12 Tubulo-interstitial nephritis, not specified as acute or chronic: Secondary | ICD-10-CM

## 2012-11-21 DIAGNOSIS — F17200 Nicotine dependence, unspecified, uncomplicated: Secondary | ICD-10-CM | POA: Diagnosis present

## 2012-11-21 DIAGNOSIS — J209 Acute bronchitis, unspecified: Secondary | ICD-10-CM | POA: Diagnosis present

## 2012-11-21 DIAGNOSIS — R197 Diarrhea, unspecified: Secondary | ICD-10-CM

## 2012-11-21 DIAGNOSIS — Z23 Encounter for immunization: Secondary | ICD-10-CM

## 2012-11-21 DIAGNOSIS — D72829 Elevated white blood cell count, unspecified: Secondary | ICD-10-CM | POA: Diagnosis present

## 2012-11-21 DIAGNOSIS — R112 Nausea with vomiting, unspecified: Secondary | ICD-10-CM

## 2012-11-21 DIAGNOSIS — A419 Sepsis, unspecified organism: Principal | ICD-10-CM | POA: Diagnosis present

## 2012-11-21 DIAGNOSIS — Z8249 Family history of ischemic heart disease and other diseases of the circulatory system: Secondary | ICD-10-CM

## 2012-11-21 DIAGNOSIS — R509 Fever, unspecified: Secondary | ICD-10-CM | POA: Diagnosis present

## 2012-11-21 LAB — URINALYSIS, ROUTINE W REFLEX MICROSCOPIC
Glucose, UA: NEGATIVE mg/dL
Nitrite: POSITIVE — AB
pH: 5.5 (ref 5.0–8.0)

## 2012-11-21 LAB — CBC WITH DIFFERENTIAL/PLATELET
Basophils Relative: 0 % (ref 0–1)
Eosinophils Absolute: 0.1 10*3/uL (ref 0.0–0.7)
Eosinophils Relative: 0 % (ref 0–5)
Hemoglobin: 15.5 g/dL — ABNORMAL HIGH (ref 12.0–15.0)
MCH: 34.1 pg — ABNORMAL HIGH (ref 26.0–34.0)
MCHC: 35.3 g/dL (ref 30.0–36.0)
MCV: 96.7 fL (ref 78.0–100.0)
Monocytes Absolute: 2 10*3/uL — ABNORMAL HIGH (ref 0.1–1.0)
Monocytes Relative: 11 % (ref 3–12)
Neutrophils Relative %: 83 % — ABNORMAL HIGH (ref 43–77)

## 2012-11-21 LAB — COMPREHENSIVE METABOLIC PANEL
Albumin: 3.3 g/dL — ABNORMAL LOW (ref 3.5–5.2)
BUN: 16 mg/dL (ref 6–23)
Calcium: 9.9 mg/dL (ref 8.4–10.5)
Creatinine, Ser: 1.01 mg/dL (ref 0.50–1.10)
GFR calc Af Amer: 85 mL/min — ABNORMAL LOW (ref >90)
Total Protein: 8.3 g/dL (ref 6.0–8.3)

## 2012-11-21 LAB — RAPID STREP SCREEN (MED CTR MEBANE ONLY): Streptococcus, Group A Screen (Direct): NEGATIVE

## 2012-11-21 LAB — URINE MICROSCOPIC-ADD ON

## 2012-11-21 LAB — MAGNESIUM: Magnesium: 2.2 mg/dL (ref 1.5–2.5)

## 2012-11-21 MED ORDER — MORPHINE SULFATE 2 MG/ML IJ SOLN
2.0000 mg | INTRAMUSCULAR | Status: DC | PRN
  Administered 2012-11-21 – 2012-11-23 (×6): 2 mg via INTRAVENOUS
  Filled 2012-11-21 (×7): qty 1

## 2012-11-21 MED ORDER — ONDANSETRON HCL 4 MG PO TABS: 4.0000 mg | ORAL_TABLET | Freq: Four times a day (QID) | ORAL | Status: DC | PRN

## 2012-11-21 MED ORDER — FENTANYL CITRATE 0.05 MG/ML IJ SOLN: 50.0000 ug | INTRAMUSCULAR | Status: DC | PRN

## 2012-11-21 MED ORDER — IOHEXOL 300 MG/ML  SOLN: 50.0000 mL | Freq: Once | INTRAMUSCULAR | Status: DC | PRN

## 2012-11-21 MED ORDER — MAGNESIUM CITRATE PO SOLN: 1.0000 | Freq: Once | ORAL | Status: AC | PRN

## 2012-11-21 MED ORDER — KETOROLAC TROMETHAMINE 30 MG/ML IJ SOLN
30.0000 mg | Freq: Once | INTRAMUSCULAR | Status: AC
  Administered 2012-11-21: 30 mg via INTRAVENOUS
  Filled 2012-11-21: qty 1

## 2012-11-21 MED ORDER — ONDANSETRON HCL 4 MG/2ML IJ SOLN: 4.0000 mg | Freq: Three times a day (TID) | INTRAMUSCULAR | Status: DC | PRN

## 2012-11-21 MED ORDER — SODIUM CHLORIDE 0.9 % IV SOLN
1000.0000 mL | INTRAVENOUS | Status: DC
  Administered 2012-11-21: 1000 mL via INTRAVENOUS

## 2012-11-21 MED ORDER — SODIUM CHLORIDE 0.9 % IV SOLN
INTRAVENOUS | Status: DC
  Administered 2012-11-21 – 2012-11-22 (×4): via INTRAVENOUS

## 2012-11-21 MED ORDER — PRENATAL MULTIVITAMIN CH
1.0000 | ORAL_TABLET | Freq: Every day | ORAL | Status: DC
Start: 2012-11-21 — End: 2012-11-23
  Administered 2012-11-22 – 2012-11-23 (×2): 1 via ORAL
  Filled 2012-11-21 (×5): qty 1

## 2012-11-21 MED ORDER — ALBUTEROL SULFATE (5 MG/ML) 0.5% IN NEBU: 2.5000 mg | INHALATION_SOLUTION | RESPIRATORY_TRACT | Status: DC | PRN

## 2012-11-21 MED ORDER — INFLUENZA VAC SPLIT QUAD 0.5 ML IM SUSP
0.5000 mL | INTRAMUSCULAR | Status: DC
  Filled 2012-11-21: qty 0.5

## 2012-11-21 MED ORDER — IPRATROPIUM BROMIDE 0.02 % IN SOLN: 0.5000 mg | RESPIRATORY_TRACT | Status: DC | PRN

## 2012-11-21 MED ORDER — SODIUM CHLORIDE 0.9 % IV SOLN
1000.0000 mL | Freq: Once | INTRAVENOUS | Status: AC
  Administered 2012-11-21: 1000 mL via INTRAVENOUS

## 2012-11-21 MED ORDER — SODIUM CHLORIDE 0.9 % IV SOLN: INTRAVENOUS | Status: DC

## 2012-11-21 MED ORDER — ALUM & MAG HYDROXIDE-SIMETH 200-200-20 MG/5ML PO SUSP: 30.0000 mL | Freq: Four times a day (QID) | ORAL | Status: DC | PRN

## 2012-11-21 MED ORDER — SORBITOL 70 % SOLN: 30.0000 mL | Freq: Every day | Status: DC | PRN

## 2012-11-21 MED ORDER — ACETAMINOPHEN 650 MG RE SUPP: 650.0000 mg | Freq: Four times a day (QID) | RECTAL | Status: DC | PRN

## 2012-11-21 MED ORDER — DEXTROSE 5 % IV SOLN
1.0000 g | Freq: Once | INTRAVENOUS | Status: AC
  Administered 2012-11-21: 1 g via INTRAVENOUS
  Filled 2012-11-21: qty 10

## 2012-11-21 MED ORDER — FENTANYL CITRATE 0.05 MG/ML IJ SOLN
50.0000 ug | Freq: Once | INTRAMUSCULAR | Status: AC
  Administered 2012-11-21: 50 ug via INTRAVENOUS
  Filled 2012-11-21: qty 2

## 2012-11-21 MED ORDER — ACETAMINOPHEN 325 MG PO TABS
650.0000 mg | ORAL_TABLET | Freq: Four times a day (QID) | ORAL | Status: DC | PRN
  Administered 2012-11-21 – 2012-11-22 (×2): 650 mg via ORAL
  Filled 2012-11-21 (×2): qty 2

## 2012-11-21 MED ORDER — IOHEXOL 300 MG/ML  SOLN: 100.0000 mL | Freq: Once | INTRAMUSCULAR | Status: DC | PRN

## 2012-11-21 MED ORDER — GUAIFENESIN ER 600 MG PO TB12
1200.0000 mg | ORAL_TABLET | Freq: Two times a day (BID) | ORAL | Status: DC
  Administered 2012-11-21 – 2012-11-23 (×4): 1200 mg via ORAL
  Filled 2012-11-21 (×4): qty 2

## 2012-11-21 MED ORDER — SODIUM CHLORIDE 0.9 % IV BOLUS (SEPSIS)
1000.0000 mL | Freq: Once | INTRAVENOUS | Status: AC
  Administered 2012-11-21: 1000 mL via INTRAVENOUS

## 2012-11-21 MED ORDER — POLYETHYLENE GLYCOL 3350 17 G PO PACK: 17.0000 g | PACK | Freq: Every day | ORAL | Status: DC | PRN

## 2012-11-21 MED ORDER — OXYCODONE HCL 5 MG PO TABS
5.0000 mg | ORAL_TABLET | ORAL | Status: DC | PRN
  Administered 2012-11-21 – 2012-11-23 (×6): 5 mg via ORAL
  Filled 2012-11-21 (×7): qty 1

## 2012-11-21 MED ORDER — ONDANSETRON HCL 4 MG/2ML IJ SOLN
4.0000 mg | Freq: Four times a day (QID) | INTRAMUSCULAR | Status: DC | PRN
  Administered 2012-11-21: 4 mg via INTRAVENOUS
  Filled 2012-11-21: qty 2

## 2012-11-21 MED ORDER — DEXTROSE 5 % IV SOLN
1.0000 g | INTRAVENOUS | Status: DC
  Administered 2012-11-21 – 2012-11-22 (×2): 1 g via INTRAVENOUS
  Filled 2012-11-21 (×4): qty 10

## 2012-11-21 MED ORDER — ENOXAPARIN SODIUM 40 MG/0.4ML ~~LOC~~ SOLN
40.0000 mg | SUBCUTANEOUS | Status: DC
  Administered 2012-11-21 – 2012-11-22 (×2): 40 mg via SUBCUTANEOUS
  Filled 2012-11-21 (×2): qty 0.4

## 2012-11-21 MED ORDER — PNEUMOCOCCAL VAC POLYVALENT 25 MCG/0.5ML IJ INJ
0.5000 mL | INJECTION | INTRAMUSCULAR | Status: DC
  Filled 2012-11-21: qty 0.5

## 2012-11-21 NOTE — ED Notes (Signed)
Patient stated that she felt "light headed" when she sat up and in the standing position.  Patient did not have any difficulty standing on her own.

## 2012-11-21 NOTE — ED Notes (Signed)
Fever,  Vomiting and diarrhea since Monday.

## 2012-11-21 NOTE — ED Provider Notes (Signed)
CSN: [id number]     Arrival date & time 11/21/12  1111 History  This chart was scribed for Ward Givens, MD by Quintella Reichert, ED scribe.  This patient was seen in room [facility]04/[facility]04 and the patient's care was started at 12:13 PM.   Chief Complaint  Patient presents with  . Fever  . Emesis  . Diarrhea    The history is provided by the patient. No language interpreter was used.    HPI Comments: Shannon Dyer is a 32 y.o. female who presents to the Emergency Department complaining of 3 days of progressively-worsening moderate-to-severe fever with associated vomiting, sore throat,, left side pain, and cough.  Pt states her highest temperature pta was 105.0 F the first day.  She also states she has had chills intermittently for 10-15 minutes at a time.  She vomited 3 times several days ago for a couple of days  but this has since resolved.  She has also had diarrhea over the past 2 days.  In addition she notes a sore throat and cough productive of clear or greenish sputum.  She also complains of moderate left side pain that is worsened by coughing.  Also feels weak and dizzy when she stands up.  She denies rhinorrhea or CP.  Pt has been drinking Gatorade and trying to stay well-hydrated.  She denies sick contacts.  She states she may have had pneumonia one time as a child but none since then.  She is a current 5-cigarettes-per-day smoker.  She does not drink alcohol.  She is on prenatal vitamins but is not pregnant.  LNMP started 2 days ago.  She works as a Public librarian at [employer].    PCP is Dr. Milinda Antis   Past Medical History  Diagnosis Date  . Tobacco abuse     Past Surgical History  Procedure Laterality Date  . 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 Hypertension Father     History  Substance Use Topics  . Smoking status: Light Tobacco Smoker -- 0.25 packs/day  . Smokeless tobacco: Not on file  . Alcohol Use: No     Comment: social   employed as a Producer, television/film/video  OB History   Grav Para Term Preterm Abortions TAB SAB Ect Mult Living   0               Review of Systems  Constitutional: Positive for fever and chills.  HENT: Negative for rhinorrhea.   Respiratory: Positive for cough.        Left side pain  Cardiovascular: Negative for chest pain.  Gastrointestinal: Positive for vomiting (resolved) and diarrhea.  Neurological: Positive for weakness (generalized) and headaches.  All other systems reviewed and are negative.     Allergies  Review of patient's allergies indicates no known allergies.  Home Medications   No current outpatient prescriptions on file. BP 111/67  Pulse 119  Temp(Src) 98.8 F (37.1 C) (Oral)  Ht 5\' 3"  (1.6 m)  Wt 125 lb (56.7 kg)  BMI 22.15 kg/m2  SpO2 97%  LMP 11/15/2012  Vital signs normal except tachycardia   Physical Exam  Nursing note and vitals reviewed. Constitutional: She is oriented to person, place, and time. She appears well-developed and well-nourished.  Non-toxic appearance. She does not appear ill. No distress.  HENT:  Head: Normocephalic and atraumatic.  Right Ear: External ear normal.  Left Ear: External ear normal.  Nose: Nose normal.  No mucosal edema or rhinorrhea.  Mouth/Throat: Oropharynx is clear and moist. Mucous membranes are dry. No dental abscesses or uvula swelling.  Eyes: Conjunctivae and EOM are normal. Pupils are equal, round, and reactive to light.  Neck: Normal range of motion and full passive range of motion without pain. Neck supple.  Cardiovascular: Normal rate, regular rhythm and normal heart sounds.  Exam reveals no gallop and no friction rub.   No murmur heard. Pulmonary/Chest: Effort normal and breath sounds normal. No respiratory distress. She has no wheezes. She has no rhonchi. She has no rales. She exhibits no tenderness and no crepitus.  Mild increased tactile fremitus Possible mild egophony on the left  Abdominal: Soft. Normal  appearance and bowel sounds are normal. She exhibits no distension. There is no tenderness. There is no rebound and no guarding.    Tender over left lateral abdominal muscles to palpation and when changing positions  Musculoskeletal: Normal range of motion. She exhibits no edema and no tenderness.  Moves all extremities well.   Neurological: She is alert and oriented to person, place, and time. She has normal strength. No cranial nerve deficit.  Skin: Skin is warm, dry and intact. No rash noted. No erythema. No pallor.  Psychiatric: She has a normal mood and affect. Her speech is normal and behavior is normal. Her mood appears not anxious.    ED Course  Procedures (including critical care time)  Medications  0.9 %  sodium chloride infusion (0 mLs Intravenous Stopped 11/21/12 1534)    Followed by  0.9 %  sodium chloride infusion (1,000 mLs Intravenous New Bag/Given 11/21/12 1534)  0.9 %  sodium chloride infusion (not administered)  ondansetron (ZOFRAN) injection 4 mg (not administered)  fentaNYL (SUBLIMAZE) injection 50 mcg (not administered)  cefTRIAXone (ROCEPHIN) 1 g in dextrose 5 % 50 mL IVPB (0 g Intravenous Stopped 11/21/12 1450)  ketorolac (TORADOL) 30 MG/ML injection 30 mg (30 mg Intravenous Given 11/21/12 1358)  fentaNYL (SUBLIMAZE) injection 50 mcg (50 mcg Intravenous Given 11/21/12 1534)     DIAGNOSTIC STUDIES: Oxygen Saturation is 97% on room air, normal by my interpretation.    COORDINATION OF CARE: 12:22 PM: Discussed treatment plan which includes CXR and bloodwork.  Pt expressed understanding and agreed to plan.  Pt continues to complain of pain. Given IV fentanyl. Discussed she has an infection in her kidneys (pyelonephritis). She has been having vomiting and diarrhea, may need to be admitted. CT ordered to make sure she doesn't have an underlying renal stone.   15:42 Dr Janee Morn, admit to med-surg, team 2  Labs Review Results for orders placed during the hospital  encounter of 11/21/12  RAPID STREP SCREEN      Result Value Range   Streptococcus, Group A Screen (Direct) NEGATIVE  NEGATIVE  CBC WITH DIFFERENTIAL      Result Value Range   WBC 18.6 (*) 4.0 - 10.5 K/uL   RBC 4.54  3.87 - 5.11 MIL/uL   Hemoglobin 15.5 (*) 12.0 - 15.0 g/dL   HCT 16.1  09.6 - 04.5 %   MCV 96.7  78.0 - 100.0 fL   MCH 34.1 (*) 26.0 - 34.0 pg   MCHC 35.3  30.0 - 36.0 g/dL   RDW 40.9  81.1 - 91.4 %   Platelets 213  150 - 400 K/uL   Neutrophils Relative % 83 (*) 43 - 77 %   Neutro Abs 15.4 (*) 1.7 - 7.7 K/uL   Lymphocytes Relative 6 (*) 12 -  46 %   Lymphs Abs 1.1  0.7 - 4.0 K/uL   Monocytes Relative 11  3 - 12 %   Monocytes Absolute 2.0 (*) 0.1 - 1.0 K/uL   Eosinophils Relative 0  0 - 5 %   Eosinophils Absolute 0.1  0.0 - 0.7 K/uL   Basophils Relative 0  0 - 1 %   Basophils Absolute 0.0  0.0 - 0.1 K/uL  COMPREHENSIVE METABOLIC PANEL      Result Value Range   Sodium 137  135 - 145 mEq/L   Potassium 3.9  3.5 - 5.1 mEq/L   Chloride 99  96 - 112 mEq/L   CO2 26  19 - 32 mEq/L   Glucose, Bld 93  70 - 99 mg/dL   BUN 16  6 - 23 mg/dL   Creatinine, Ser 1.61  0.50 - 1.10 mg/dL   Calcium 9.9  8.4 - 09.6 mg/dL   Total Protein 8.3  6.0 - 8.3 g/dL   Albumin 3.3 (*) 3.5 - 5.2 g/dL   AST 9  0 - 37 U/L   ALT <5  0 - 35 U/L   Alkaline Phosphatase 87  39 - 117 U/L   Total Bilirubin 0.4  0.3 - 1.2 mg/dL   GFR calc non Af Amer 73 (*) >90 mL/min   GFR calc Af Amer 85 (*) >90 mL/min  URINALYSIS, ROUTINE W REFLEX MICROSCOPIC      Result Value Range   Color, Urine YELLOW  YELLOW   APPearance HAZY (*) CLEAR   Specific Gravity, Urine 1.020  1.005 - 1.030   pH 5.5  5.0 - 8.0   Glucose, UA NEGATIVE  NEGATIVE mg/dL   Hgb urine dipstick NEGATIVE  NEGATIVE   Bilirubin Urine SMALL (*) NEGATIVE   Ketones, ur NEGATIVE  NEGATIVE mg/dL   Protein, ur 30 (*) NEGATIVE mg/dL   Urobilinogen, UA 1.0  0.0 - 1.0 mg/dL   Nitrite POSITIVE (*) NEGATIVE   Leukocytes, UA SMALL (*) NEGATIVE  URINE  MICROSCOPIC-ADD ON      Result Value Range   Squamous Epithelial / LPF FEW (*) RARE   WBC, UA TOO NUMEROUS TO COUNT  <3 WBC/hpf   RBC / HPF 0-2  <3 RBC/hpf   Bacteria, UA MANY (*) RARE     Imaging Review Dg Chest 2 View  11/21/2012   CLINICAL DATA:  Productive cough. Fever. Chest pain. Smoker.  EXAM: CHEST  2 VIEW  COMPARISON:  Thoracic spine radiographs dated 05/27/2011.  FINDINGS: Borderline enlarged cardiac silhouette. Clear lungs. Bilateral nipple shadows. Minimal diffuse peribronchial thickening. Unremarkable bones.  IMPRESSION: Minimal bronchitic changes and borderline cardiomegaly.   Electronically Signed   By: Gordan Payment M.D.   On: 11/21/2012 13:16    AP CT pending   MDM   1. Pyelonephritis   2. Diarrhea   3. Nausea and vomiting    Plan admission  Devoria Albe, MD, FACEP   I personally performed the services described in this documentation, which was scribed in my presence. The recorded information has been reviewed and considered.  Devoria Albe, MD, Armando Gang    Ward Givens, MD 11/21/12 [id number]

## 2012-11-21 NOTE — H&P (Signed)
[facility] History and Physical  Shannon Dyer ZOX:[id number] DOB: 02-19-1980 DOA: 11/21/2012  Referring physician: Dr. Lynelle Doctor PCP: Roxy Manns, MD  Specialists: None  Chief Complaint: Fever/ emesis/ left side pain/diarrhea  HPI: Shannon Dyer is a 32 y.o. female  With no significant past medical history presented to the ED with a four-day history of fevers as high as 105, chills, generalized weakness, nausea and emesis which has been ongoing for 2 days, myalgias, left-sided pain, headache. Patient stated she had 2 episodes of diarrhea on Wednesday and one episode on the morning of admission. Patient denies any chest pain, no shortness of breath, no recent use of antibiotics. Patient does endorse a productive cough of occasional clear mucus as well as greenish mucus. Patient denies any dysuria. Patient was seen in the ED was noted to have a leukocytosis with a white count of 18.6, hemoglobin of 15.5. Comprehensive metabolic profile was unremarkable. Urinalysis is nitrite positive small leukocytes too numerous to count WBCs. Chest x-ray was negative for any acute infiltrate. We were called to admit the patient for further evaluation and management.  Review of Systems: The patient denies anorexia, fever, weight loss,, vision loss, decreased hearing, hoarseness, chest pain, syncope, dyspnea on exertion, peripheral edema, balance deficits, hemoptysis, abdominal pain, melena, hematochezia, severe indigestion/heartburn, hematuria, incontinence, genital sores, muscle weakness, suspicious skin lesions, transient blindness, difficulty walking, depression, unusual weight change, abnormal bleeding, enlarged lymph nodes, angioedema, and breast masses.   Past Medical History  Diagnosis Date  . Tobacco abuse    Past Surgical History  Procedure Laterality Date  . 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  She does not have any smokeless tobacco history on  file. She reports that she does not drink alcohol or use illicit drugs.  No Known Allergies  Family History  Problem Relation Age of Onset  . Hypertension Father     Prior to Admission medications   Medication Sig Start Date End Date Taking? Authorizing Provider  Acetaminophen (RA FEVER REDUCER/PAIN RELIEVER PO) Take 3 tablets by mouth every 4 (four) hours as needed.   Yes Historical Provider, MD  Prenatal Vit-Fe Fumarate-FA (PRENATAL MULTIVITAMIN) TABS Take 1 tablet by mouth daily.    Yes Historical Provider, MD   Physical Exam: Filed Vitals:   11/21/12 1616  BP: 109/72  Pulse: 93  Temp:   Resp:      General:  Well-developed well-nourished female in no acute cardiopulmonary distress.  Eyes: Pupils equal round and reactive to light and accommodation. Extraocular movements intact.  ENT: Oropharynx is clear, no lesions, no exudates.  Neck: Supple with no lymphadenopathy.  Cardiovascular: Regular rate rhythm no murmurs rubs or gallops.  Respiratory: Clear to auscultation bilaterally, no wheezes, no crackles, no rhonchi.  Abdomen: Soft, nontender, nondistended, positive bowel sounds. Left CVA tenderness.  Skin: No rashes or lesions.  Musculoskeletal: 5/5 BUE strength, 5/5 BLE strength  Psychiatric: Normal mood. Normal affect. Good insight. Good judgment.  Neurologic: Alert and oriented x3. Cranial nerves II through XII are grossly intact. No focal deficits.  Labs on Admission:  Basic Metabolic Panel:  Recent Labs Lab 11/21/12 1237  NA 137  K 3.9  CL 99  CO2 26  GLUCOSE 93  BUN 16  CREATININE 1.01  CALCIUM 9.9   Liver Function Tests:  Recent Labs Lab 11/21/12 1237  AST 9  ALT <5  ALKPHOS 87  BILITOT 0.4  PROT 8.3  ALBUMIN 3.3*   No  results found for this basename: LIPASE, AMYLASE,  in the last 168 hours No results found for this basename: AMMONIA,  in the last 168 hours CBC:  Recent Labs Lab 11/21/12 1237  WBC 18.6*  NEUTROABS 15.4*  HGB  15.5*  HCT 43.9  MCV 96.7  PLT 213   Cardiac Enzymes: No results found for this basename: CKTOTAL, CKMB, CKMBINDEX, TROPONINI,  in the last 168 hours  BNP (last 3 results) No results found for this basename: PROBNP,  in the last 8760 hours CBG: No results found for this basename: GLUCAP,  in the last 168 hours  Radiological Exams on Admission: Dg Chest 2 View  11/21/2012   CLINICAL DATA:  Productive cough. Fever. Chest pain. Smoker.  EXAM: CHEST  2 VIEW  COMPARISON:  Thoracic spine radiographs dated 05/27/2011.  FINDINGS: Borderline enlarged cardiac silhouette. Clear lungs. Bilateral nipple shadows. Minimal diffuse peribronchial thickening. Unremarkable bones.  IMPRESSION: Minimal bronchitic changes and borderline cardiomegaly.   Electronically Signed   By: Gordan Payment M.D.   On: 11/21/2012 13:16    EKG: None  Assessment/Plan Principal Problem:   Pyelonephritis Active Problems:   ANXIETY DEPRESSION   Nausea and vomiting   UTI (urinary tract infection)   Leukocytosis, unspecified   Myalgia   Fever, unspecified   Acute bronchitis/ probable   Diarrhea  #1 acute pyelonephritis/UTI Patient is present with fever chills left CVA tenderness urinalysis consistent with a UTI. Urine cultures are pending. Will check blood cultures x2. CT of the abdomen and pelvis is pending. Placed empirically on IV Rocephin. Pain management. IV fluids. Supportive care.  #2 fever Portable etiology. Likely secondary to problem #1 a UTI. Patient also with complaints of upper rest for his symptoms and some myalgias. Will check an influenza panel PCR. Check blood cultures x2. Repeat chest x-ray in the morning of the patient is hydrated. Urine cultures are pending. Placed empirically on IV Rocephin. Follow.  #3 leukocytosis Likely secondary to problem #1. Urine cultures are pending. Check blood cultures x2. Repeat chest x-ray in the morning. Placed empirically on IV Rocephin. Follow.  #4 probable acute  bronchitis Patient presented with complaints of sore throat, productive cough of occasional greenish and whitish sputum. Chest x-ray with some minimal bronchitic changes and negative for acute infiltrate. Repeat chest x-ray in the morning of the patient is hydrated. Patient will be started empirically on IV Rocephin. Follow.  #5 diarrhea Patient stated had about 3 loose bowel movements one day prior to admission and one on the morning of admission none since. Patient has not been on any antibiotics recently. Will monitor for now. If patient continues to have multiple loose stools will need to check a C. difficile PCR. Will follow for now. Supportive care.  #6 depression/anxiety Stable. Follow.  #7 prophylaxis PPI for GI prophylaxis. Lovenox for DVT prophylaxis.  Code Status: Full Family Communication: Updated patient and father at bedside. Disposition Plan: Admit to MedSurg.  Time spent: 65 mins  [facility] Pager [phone]  If 7PM-7AM, please contact night-coverage [email or web address] Password [facility] 11/21/2012, 5:27 PM

## 2012-11-21 NOTE — ED Notes (Addendum)
Patient to ED c/o fever since Monday, vomiting since Monday, and diarrhea starting yesterday. Patient denies being around anyone who has been sick. Patient has been drinking lots of fluids and eating broth, soup, and crackers. Patient states her throat gets "tight like I'm having anxiety". Productive cough clear to greenish. Sharp pain on left side and neck pain. No nasal congestion or discharge. Patient reports to be having a hard time sleeping. Denies SOB. Patient states she took "[facility] pain reliever and fever reducer" approx 11AM. Temp 98.8 at this time of assessment orally. Patient alert and oriented X4, active and able to appropriately answer questions. General appearance well, nourished, warm and dry. Patient states she has been under a lot of stress recently

## 2012-11-22 ENCOUNTER — Inpatient Hospital Stay (HOSPITAL_COMMUNITY): Payer: BC Managed Care – PPO

## 2012-11-22 DIAGNOSIS — R509 Fever, unspecified: Secondary | ICD-10-CM

## 2012-11-22 LAB — COMPREHENSIVE METABOLIC PANEL
AST: 8 U/L (ref 0–37)
BUN: 9 mg/dL (ref 6–23)
CO2: 24 mEq/L (ref 19–32)
Calcium: 8.5 mg/dL (ref 8.4–10.5)
Creatinine, Ser: 0.87 mg/dL (ref 0.50–1.10)
GFR calc non Af Amer: 88 mL/min — ABNORMAL LOW (ref >90)
Total Bilirubin: 0.2 mg/dL — ABNORMAL LOW (ref 0.3–1.2)
Total Protein: 6.6 g/dL (ref 6.0–8.3)

## 2012-11-22 LAB — CBC
Hemoglobin: 12.2 g/dL (ref 12.0–15.0)
MCH: 33.1 pg (ref 26.0–34.0)
Platelets: 224 10*3/uL (ref 150–400)
RBC: 3.69 MIL/uL — ABNORMAL LOW (ref 3.87–5.11)
WBC: 14.5 10*3/uL — ABNORMAL HIGH (ref 4.0–10.5)

## 2012-11-22 NOTE — Progress Notes (Signed)
[facility] PROGRESS NOTE  Shannon Dyer ZOX:[id number] DOB: November 11, 1980 DOA: 11/21/2012 PCP: Roxy Manns, MD  Assessment/Plan: 1. Sepsis/Pyelonephritis - Continue IV ceftriaxone, IVF -FU Blood and urine CX -WBC and fever curve trending down -advance diet  2. Cough -likely related to Atelectasis and splinting of diaphragm from flank pain  3. Diarrhea -resolved, likely Sepsis related -Cdiff PCR ordered on admission, will FU  4. DVT proph: lovenox  Code Status: Full Family Communication: none at bedside Disposition Plan: home in 1-2days    Antibiotics:  Ceftriaxone  HPI/Subjective: Still feels very sick and having a lot of L flank pain  Objective: Filed Vitals:   11/22/12 0609  BP: 100/66  Pulse: 102  Temp: 100.7 F (38.2 C)  Resp: 20    Intake/Output Summary (Last 24 hours) at 11/22/12 1050 Last data filed at 11/22/12 0848  Gross per 24 hour  Intake 2658.33 ml  Output      0 ml  Net 2658.33 ml   Filed Weights   11/21/12 1130 11/21/12 1850 11/22/12 0415  Weight: 56.7 kg (125 lb) 57 kg (125 lb 10.6 oz) 60.6 kg (133 lb 9.6 oz)    Exam:   General: AAOx3, uncomfortable appearing  Cardiovascular: S!S2/RRR  Respiratory: diminished at bases  Abdomen: soft, L CVA tenderness, NT/ND  Musculoskeletal: no edema c/c  Data Reviewed: Basic Metabolic Panel:  Recent Labs Lab 11/21/12 1237 11/22/12 0511  NA 137 137  K 3.9 4.1  CL 99 104  CO2 26 24  GLUCOSE 93 96  BUN 16 9  CREATININE 1.01 0.87  CALCIUM 9.9 8.5  MG 2.2  --    Liver Function Tests:  Recent Labs Lab 11/21/12 1237 11/22/12 0511  AST 9 8  ALT <5 <5  ALKPHOS 87 68  BILITOT 0.4 0.2*  PROT 8.3 6.6  ALBUMIN 3.3* 2.5*   No results found for this basename: LIPASE, AMYLASE,  in the last 168 hours No results found for this basename: AMMONIA,  in the last 168 hours CBC:  Recent Labs Lab 11/21/12 1237 11/22/12 0511  WBC 18.6* 14.5*  NEUTROABS 15.4*  --   HGB 15.5* 12.2   HCT 43.9 35.7*  MCV 96.7 96.7  PLT 213 224   Cardiac Enzymes: No results found for this basename: CKTOTAL, CKMB, CKMBINDEX, TROPONINI,  in the last 168 hours BNP (last 3 results) No results found for this basename: PROBNP,  in the last 8760 hours CBG: No results found for this basename: GLUCAP,  in the last 168 hours  Recent Results (from the past 240 hour(s))  RAPID STREP SCREEN     Status: None   Collection Time    11/21/12 12:25 PM      Result Value Range Status   Streptococcus, Group A Screen (Direct) NEGATIVE  NEGATIVE Final   Comment: (NOTE)     A Rapid Antigen test may result negative if the antigen level in the     sample is below the detection level of this test. The FDA has not     cleared this test as a stand-alone test therefore the rapid antigen     negative result has reflexed to a Group A Strep culture.     Studies: Ct Abdomen Pelvis Wo Contrast  11/21/2012   CLINICAL DATA:  Left-sided pain. Fever. Assess abscess versus pyelonephritis.  EXAM: CT ABDOMEN AND PELVIS WITHOUT CONTRAST  TECHNIQUE: Multidetector CT imaging of the abdomen and pelvis was performed following the standard protocol without intravenous contrast.  COMPARISON:  September 22, 2009  FINDINGS: There is left perinephric stranding. There are no kidney stones bilaterally. No hydronephrosis is noted bilaterally.  The liver, spleen, pancreas, gallbladder, adrenal glands are normal. The aorta is normal. There is no bowel obstruction or diverticulitis. The appendix is normal.  Fluid-filled bladder is normal. Stable pelvic phleboliths are identified. The uterus is normal. The visualized lung bases are clear. No acute abnormalities identified within the bones.  IMPRESSION: Left perinephric stranding without hydronephrosis or kidney stones. The findings can be seen in pyelonephritis. No focal discrete abscess is identified.   Electronically Signed   By: Sherian Rein M.D.   On: 11/21/2012 17:35   Dg Chest 2  View  11/22/2012   CLINICAL DATA:  Cough  EXAM: CHEST  2 VIEW  COMPARISON:  November 21, 2012  FINDINGS: Lungs are clear. Heart size and pulmonary vascularity are normal. No adenopathy. No bone lesions.  IMPRESSION: No edema or consolidation.   Electronically Signed   By: Bretta Bang M.D.   On: 11/22/2012 08:20   Dg Chest 2 View  11/21/2012   CLINICAL DATA:  Productive cough. Fever. Chest pain. Smoker.  EXAM: CHEST  2 VIEW  COMPARISON:  Thoracic spine radiographs dated 05/27/2011.  FINDINGS: Borderline enlarged cardiac silhouette. Clear lungs. Bilateral nipple shadows. Minimal diffuse peribronchial thickening. Unremarkable bones.  IMPRESSION: Minimal bronchitic changes and borderline cardiomegaly.   Electronically Signed   By: Gordan Payment M.D.   On: 11/21/2012 13:16    Scheduled Meds: . cefTRIAXone (ROCEPHIN)  IV  1 g Intravenous Q24H  . enoxaparin (LOVENOX) injection  40 mg Subcutaneous Q24H  . guaiFENesin  1,200 mg Oral BID  . influenza vac split quadrivalent PF  0.5 mL Intramuscular Tomorrow-1000  . pneumococcal 23 valent vaccine  0.5 mL Intramuscular Tomorrow-1000  . prenatal multivitamin  1 tablet Oral Daily   Continuous Infusions: . sodium chloride 125 mL/hr at 11/22/12 1610    Principal Problem:   Pyelonephritis Active Problems:   ANXIETY DEPRESSION   Nausea and vomiting   UTI (urinary tract infection)   Leukocytosis, unspecified   Myalgia   Fever, unspecified   Acute bronchitis/ probable   Diarrhea    Time spent:    [facility]  [facility] Pager [phone]. If 7PM-7AM, please contact night-coverage at [email or web address], password [facility] 11/22/2012, 10:50 AM  LOS: 1 day

## 2012-11-22 NOTE — Care Management Note (Signed)
    Page 1 of 1   11/22/2012     11:56:55 AM   CARE MANAGEMENT NOTE 11/22/2012  Patient:  Shannon Dyer,Shannon Dyer   Account Number:  [id number]  Date Initiated:  11/22/2012  Documentation initiated by:  Sharrie Rothman  Subjective/Objective Assessment:   Pt admitted from home with pyelonephritis. Pt lives with a roommate and will return home at discharge. Pt is independent with ADL's.     Action/Plan:   No CM needs noted.   Anticipated DC Date:  11/25/2012   Anticipated DC Plan:  HOME/SELF CARE      DC Planning Services  CM consult      Choice offered to / List presented to:             Status of service:  Completed, signed off Medicare Important Message given?   (If response is "NO", the following Medicare IM given date fields will be blank) Date Medicare IM given:   Date Additional Medicare IM given:    Discharge Disposition:  HOME/SELF CARE  Per UR Regulation:    If discussed at Long Length of Stay Meetings, dates discussed:    Comments:  11/22/12 1200 Arlyss Queen, RN BSN CM

## 2012-11-22 NOTE — Progress Notes (Signed)
UR chart review completed.  

## 2012-11-23 LAB — CBC
HCT: 33.3 % — ABNORMAL LOW (ref 36.0–46.0)
Hemoglobin: 11.6 g/dL — ABNORMAL LOW (ref 12.0–15.0)
MCH: 33 pg (ref 26.0–34.0)
MCHC: 34.8 g/dL (ref 30.0–36.0)
MCV: 94.9 fL (ref 78.0–100.0)
RBC: 3.51 MIL/uL — ABNORMAL LOW (ref 3.87–5.11)
RDW: 13.7 % (ref 11.5–15.5)

## 2012-11-23 LAB — BASIC METABOLIC PANEL
BUN: 4 mg/dL — ABNORMAL LOW (ref 6–23)
Creatinine, Ser: 0.74 mg/dL (ref 0.50–1.10)
GFR calc Af Amer: >90 mL/min (ref >90)
GFR calc non Af Amer: >90 mL/min (ref >90)
Glucose, Bld: 96 mg/dL (ref 70–99)
Potassium: 3.2 mEq/L — ABNORMAL LOW (ref 3.5–5.1)

## 2012-11-23 LAB — URINE CULTURE

## 2012-11-23 LAB — CULTURE, GROUP A STREP

## 2012-11-23 MED ORDER — OXYCODONE HCL 5 MG PO TABS: 5.0000 mg | ORAL_TABLET | ORAL | Status: DC | PRN

## 2012-11-23 MED ORDER — UNABLE TO FIND: Status: DC

## 2012-11-23 MED ORDER — CIPROFLOXACIN HCL 500 MG PO TABS: 500.0000 mg | ORAL_TABLET | Freq: Two times a day (BID) | ORAL | Status: DC

## 2012-11-23 MED ORDER — ZOLPIDEM TARTRATE 5 MG PO TABS
5.0000 mg | ORAL_TABLET | Freq: Every evening | ORAL | Status: DC | PRN
Start: 2012-11-23 — End: 2012-11-23
  Administered 2012-11-23: 5 mg via ORAL
  Filled 2012-11-23: qty 1

## 2012-11-23 MED ORDER — IBUPROFEN 600 MG PO TABS
600.0000 mg | ORAL_TABLET | Freq: Four times a day (QID) | ORAL | Status: DC | PRN
  Administered 2012-11-23: 600 mg via ORAL
  Filled 2012-11-23: qty 1

## 2012-11-23 MED ORDER — POTASSIUM CHLORIDE CRYS ER 20 MEQ PO TBCR
40.0000 meq | EXTENDED_RELEASE_TABLET | ORAL | Status: AC
  Administered 2012-11-23 (×2): 40 meq via ORAL
  Filled 2012-11-23 (×2): qty 2

## 2012-11-23 NOTE — Progress Notes (Signed)
Pt verbalizes understanding of d/c instructions, medications and follow up appts. Pt has no questions at this time. I emphasized the importance of maintaining adequate po intake, especially water. IV was d/c without complications. Pt was given prescriptions for new medications. Pt d/c via wheelchair, accompanied by NT. Pt will be driving herself home. Sheryn Bison

## 2012-11-23 NOTE — Discharge Summary (Signed)
Physician Discharge Summary  Shannon Dyer JXB:[id number] DOB: [birth year] DOA: 11/21/2012  PCP: Roxy Manns, MD  Admit date: 11/21/2012 Discharge date: 11/23/2012  Time spent: 35 minutes  Recommendations for Outpatient Follow-up:  1. PCP in 1 week  Discharge Diagnoses:  Principal Problem:   Ecoli Pyelonephritis/UTI Active Problems:   ANXIETY DEPRESSION   Nausea and vomiting   UTI (urinary tract infection)   Leukocytosis, unspecified   Myalgia   Fever, unspecified   Acute bronchitis/ probable   Diarrhea   Discharge Condition: improving  Diet recommendation: regular  Filed Weights   11/21/12 1850 11/22/12 0415 11/23/12 0534  Weight: 57 kg (125 lb 10.6 oz) 60.6 kg (133 lb 9.6 oz) 62.2 kg (137 lb 2 oz)    History of present illness:  Shannon Dyer is a 32 y.o. female  With no significant past medical history presented to the ED with a four-day history of fevers as high as 105, chills, generalized weakness, nausea and emesis which has been ongoing for 2 days, myalgias, left-sided pain, headache. Patient stated she had 2 episodes of diarrhea on Wednesday and one episode on the morning of admission. Patient denies any chest pain, no shortness of breath, no recent use of antibiotics. Patient does endorse a productive cough of occasional clear mucus as well as greenish mucus. Patient denies any dysuria. Patient was seen in the ED was noted to have a leukocytosis with a white count of 18.6, hemoglobin of 15.5. Comprehensive metabolic profile was unremarkable. Urinalysis is nitrite positive small leukocytes too numerous to count WBCs. Chest x-ray was negative for any acute infiltrate. We were called to admit the patient for further evaluation and management.  Hospital Course:  1. Ecoli Pyelonephritis with SIRS - Treated with IVF, IV ceftriaxone, supportive care - Improving, still having some L flank pain but improved, tolerating diet, afebrile and WBC down to normal prior to  discharge. -FU Blood Cx negative so far and urine CX grew EColi, pansensitive -transitioned to PO Ciprofloxacin for 8 more days to complete 10 day course of Abx. Advised to FU with PCP in week  2. Cough  -likely related to Atelectasis and splinting of diaphragm from flank pain  -resolved  3. Diarrhea  -resolved, likely SIRS related  -Cdiff PCR ordered on admission negative    Discharge Exam: Filed Vitals:   11/23/12 0534  BP: 123/85  Pulse: 78  Temp: 99 F (37.2 C)  Resp: 20    General: AAOx3 Cardiovascular: S1S2/RRR Respiratory: CTAB  Discharge Instructions  Discharge Orders   Future Orders Complete By Expires   Increase activity slowly  As directed        Medication List    STOP taking these medications       RA FEVER REDUCER/PAIN RELIEVER PO      TAKE these medications       ciprofloxacin 500 MG tablet  Commonly known as:  CIPRO  Take 1 tablet (500 mg total) by mouth 2 (two) times daily. For 8 days     oxyCODONE 5 MG immediate release tablet  Commonly known as:  Oxy IR/ROXICODONE  Take 1 tablet (5 mg total) by mouth every 4 (four) hours as needed for pain (for severe pain).     prenatal multivitamin Tabs tablet  Take 1 tablet by mouth daily.     UNABLE TO FIND  This note is to Excuse Ms.Shannon Dyer from work due to medical illness requiring hospitalization from 10/23, [place] to return to work 10/29  No Known Allergies     Follow-up Information   Follow up with Roxy Manns, MD. Schedule an appointment as soon as possible for a visit in 1 week.   Specialties:  Family Medicine, Radiology   Contact information:   [address] [place] [address] [place] [place] [address] [phone]        The results of significant diagnostics from this hospitalization (including imaging, microbiology, ancillary and laboratory) are listed below for reference.    Significant Diagnostic Studies: Ct Abdomen Pelvis Wo Contrast  11/21/2012    CLINICAL DATA:  Left-sided pain. Fever. Assess abscess versus pyelonephritis.  EXAM: CT ABDOMEN AND PELVIS WITHOUT CONTRAST  TECHNIQUE: Multidetector CT imaging of the abdomen and pelvis was performed following the standard protocol without intravenous contrast.  COMPARISON:  September 22, 2009  FINDINGS: There is left perinephric stranding. There are no kidney stones bilaterally. No hydronephrosis is noted bilaterally.  The liver, spleen, pancreas, gallbladder, adrenal glands are normal. The aorta is normal. There is no bowel obstruction or diverticulitis. The appendix is normal.  Fluid-filled bladder is normal. Stable pelvic phleboliths are identified. The uterus is normal. The visualized lung bases are clear. No acute abnormalities identified within the bones.  IMPRESSION: Left perinephric stranding without hydronephrosis or kidney stones. The findings can be seen in pyelonephritis. No focal discrete abscess is identified.   Electronically Signed   By: Sherian Rein M.D.   On: 11/21/2012 17:35   Dg Chest 2 View  11/22/2012   CLINICAL DATA:  Cough  EXAM: CHEST  2 VIEW  COMPARISON:  November 21, 2012  FINDINGS: Lungs are clear. Heart size and pulmonary vascularity are normal. No adenopathy. No bone lesions.  IMPRESSION: No edema or consolidation.   Electronically Signed   By: Bretta Bang M.D.   On: 11/22/2012 08:20   Dg Chest 2 View  11/21/2012   CLINICAL DATA:  Productive cough. Fever. Chest pain. Smoker.  EXAM: CHEST  2 VIEW  COMPARISON:  Thoracic spine radiographs dated 05/27/2011.  FINDINGS: Borderline enlarged cardiac silhouette. Clear lungs. Bilateral nipple shadows. Minimal diffuse peribronchial thickening. Unremarkable bones.  IMPRESSION: Minimal bronchitic changes and borderline cardiomegaly.   Electronically Signed   By: Gordan Payment M.D.   On: 11/21/2012 13:16    Microbiology: Recent Results (from the past 240 hour(s))  RAPID STREP SCREEN     Status: None   Collection Time    11/21/12  12:25 PM      Result Value Range Status   Streptococcus, Group A Screen (Direct) NEGATIVE  NEGATIVE Final   Comment: (NOTE)     A Rapid Antigen test may result negative if the antigen level in the     sample is below the detection level of this test. The FDA has not     cleared this test as a stand-alone test therefore the rapid antigen     negative result has reflexed to a Group A Strep culture.  CULTURE, GROUP A STREP     Status: None   Collection Time    11/21/12 12:25 PM      Result Value Range Status   Specimen Description THROAT   Final   Special Requests NONE   Final   Culture     Final   Value: No Beta Hemolytic Streptococci Isolated     Performed at [facility]   Report Status 11/23/2012 FINAL   Final  URINE CULTURE     Status: None   Collection  Time    11/21/12 12:25 PM      Result Value Range Status   Specimen Description URINE, CLEAN CATCH   Final   Special Requests NONE   Final   Culture  Setup Time     Final   Value: 11/21/2012 21:05     Performed at [facility] Count     Final   Value: >=100,000 COLONIES/ML     Performed at [facility]   Culture     Final   Value: ESCHERICHIA COLI     Performed at [facility]   Report Status 11/23/2012 FINAL   Final   Organism ID, Bacteria ESCHERICHIA COLI   Final  CULTURE, BLOOD (ROUTINE X 2)     Status: None   Collection Time    11/21/12  5:44 PM      Result Value Range Status   Specimen Description BLOOD RIGHT ANTECUBITAL   Final   Special Requests     Final   Value: BOTTLES DRAWN AEROBIC AND ANAEROBIC AEB=12CC ANA=8CC   Culture NO GROWTH 1 DAY   Final   Report Status PENDING   Incomplete  CULTURE, BLOOD (ROUTINE X 2)     Status: None   Collection Time    11/21/12  5:58 PM      Result Value Range Status   Specimen Description BLOOD RIGHT HAND   Final   Special Requests     Final   Value: BOTTLES DRAWN AEROBIC AND ANAEROBIC AEB=7CC ANA=4CC   Culture NO GROWTH 1 DAY    Final   Report Status PENDING   Incomplete     Labs: Basic Metabolic Panel:  Recent Labs Lab 11/21/12 1237 11/22/12 0511 11/23/12 0624  NA 137 137 136  K 3.9 4.1 3.2*  CL 99 104 104  CO2 26 24 23   GLUCOSE 93 96 96  BUN 16 9 4*  CREATININE 1.01 0.87 0.74  CALCIUM 9.9 8.5 8.5  MG 2.2  --   --    Liver Function Tests:  Recent Labs Lab 11/21/12 1237 11/22/12 0511  AST 9 8  ALT <5 <5  ALKPHOS 87 68  BILITOT 0.4 0.2*  PROT 8.3 6.6  ALBUMIN 3.3* 2.5*   No results found for this basename: LIPASE, AMYLASE,  in the last 168 hours No results found for this basename: AMMONIA,  in the last 168 hours CBC:  Recent Labs Lab 11/21/12 1237 11/22/12 0511 11/23/12 0624  WBC 18.6* 14.5* 8.8  NEUTROABS 15.4*  --   --   HGB 15.5* 12.2 11.6*  HCT 43.9 35.7* 33.3*  MCV 96.7 96.7 94.9  PLT 213 224 262   Cardiac Enzymes: No results found for this basename: CKTOTAL, CKMB, CKMBINDEX, TROPONINI,  in the last 168 hours BNP: BNP (last 3 results) No results found for this basename: PROBNP,  in the last 8760 hours CBG: No results found for this basename: GLUCAP,  in the last 168 hours     Signed:  Vallerie Hentz  [facility] 11/23/2012, 2:13 PM

## 2012-11-26 LAB — CULTURE, BLOOD (ROUTINE X 2)
Culture: NO GROWTH
Culture: NO GROWTH

## 2012-12-04 ENCOUNTER — Ambulatory Visit (INDEPENDENT_AMBULATORY_CARE_PROVIDER_SITE_OTHER): Payer: BC Managed Care – PPO | Admitting: Family Medicine

## 2012-12-04 ENCOUNTER — Encounter: Payer: Self-pay | Admitting: Family Medicine

## 2012-12-04 VITALS — BP 108/68 | HR 85 | Temp 98.3°F | Ht 62.0 in | Wt 122.5 lb

## 2012-12-04 DIAGNOSIS — F341 Dysthymic disorder: Secondary | ICD-10-CM

## 2012-12-04 DIAGNOSIS — N12 Tubulo-interstitial nephritis, not specified as acute or chronic: Secondary | ICD-10-CM

## 2012-12-04 DIAGNOSIS — E876 Hypokalemia: Secondary | ICD-10-CM

## 2012-12-04 LAB — POCT URINALYSIS DIPSTICK
Bilirubin, UA: NEGATIVE
Blood, UA: NEGATIVE
Ketones, UA: NEGATIVE
Protein, UA: NEGATIVE
Spec Grav, UA: 1.01
Urobilinogen, UA: 0.2

## 2012-12-04 MED ORDER — PAROXETINE HCL 20 MG PO TABS: ORAL_TABLET | ORAL | Status: DC

## 2012-12-04 MED ORDER — ALPRAZOLAM 0.5 MG PO TABS: 0.5000 mg | ORAL_TABLET | Freq: Two times a day (BID) | ORAL | Status: DC | PRN

## 2012-12-04 NOTE — Patient Instructions (Signed)
Checking potassium today  Checking ua today - will update you with results  Stay home today and tomorrow to get some rest Start paxil at 1/2 pill daily for a week - then if tolerated increase to 1 pill daily  Xanax is for severe anxiety/ panic situations- you can take it up to twice daily as needed -and do not work or drive with it  If side effects or if you feel worse or suicidal - please stop medicines and let me know or get medical care   Follow up with me in about 2 weeks

## 2012-12-04 NOTE — Progress Notes (Signed)
Pre-visit discussion using our clinic review tool. No additional management support is needed unless otherwise documented below in the visit note.  

## 2012-12-04 NOTE — Progress Notes (Signed)
Subjective:    Patient ID: Shannon Dyer, female    DOB: May 31, 1980, 32 y.o.   MRN: [id number]  HPI Here for hospital f/u for acute pyelonephritis and also stress rxn/ depression  hosp 10/23-10/25 in [facility] Presented with high fever/flank pain / n/v/d Dx with acute pyelo/ ecoli (pan sensitive) and SIRS tx with ceftriaxone and then cipro for 8 more d after d/c -- finished it all  supp care and fluids   Does not drink sodas anymore -she thinks this added to it   K was low in hosp   Chemistry      Component Value Date/Time   NA 136 11/23/2012 0624   K 3.2* 11/23/2012 0624   CL 104 11/23/2012 0624   CO2 23 11/23/2012 0624   BUN 4* 11/23/2012 0624   CREATININE 0.74 11/23/2012 0624      Component Value Date/Time   CALCIUM 8.5 11/23/2012 0624   ALKPHOS 68 11/22/2012 0511   AST 8 11/22/2012 0511   ALT <5 11/22/2012 0511   BILITOT 0.2* 11/22/2012 0511      Lab Results  Component Value Date   WBC 8.8 11/23/2012   HGB 11.6* 11/23/2012   HCT 33.3* 11/23/2012   MCV 94.9 11/23/2012   PLT 262 11/23/2012   wbc was originally high and came down   Wt loss noted of 15 lb since last visit Some loss before and some after hospitalization  Emotional issues  Anxiety and depression - she has had problems in the past  This is worse  This episode started about 2 months ago- and building  Her mind is racing in "a million directions " and she is overwhelmed  Sometimes she gets panicy - with a feeling of heart racing and throat closing up (she has to take deep breaths )  Tried exercise and distraction  Does have good support  Never had counseling   Stressors: work and problems with her ex husband (life in general is throwing her curve balls) and then she got sick  Lot of stress at work - she is a Social worker (stressful environment)- at [employer] -no control over her circumstances  She is looking for salon space now so she can get out of her  She is not being abused or in  danger  Went through a lot with her ex - could not get pregnant and now he left her and has a baby on the way with another woman   Has been on zoloft  for depression and anxiety - it just did not work   She went straight back to work from the hospital- and overwhelmed-exhausted and in retrospect she was not ready  Patient Active Problem List   Diagnosis Date Noted  . Pyelonephritis 11/21/2012  . Nausea and vomiting 11/21/2012  . UTI (urinary tract infection) 11/21/2012  . Leukocytosis, unspecified 11/21/2012  . Myalgia 11/21/2012  . Fever, unspecified 11/21/2012  . Acute bronchitis/ probable 11/21/2012  . Diarrhea 11/21/2012  . Irregular menses 05/27/2012  . Encounter for routine gynecological examination 05/22/2012  . Amenorrhea 05/22/2012  . 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 BACK PAIN 02/16/2010  . ANXIETY DEPRESSION 06/30/2008  . GANGLION CYST, WRIST, LEFT 07/25/2007   Past Medical History  Diagnosis Date  . Tobacco abuse    Past Surgical History  Procedure Laterality Date  . 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 Smoking status: Light Tobacco Smoker -- 0.25 packs/day  Types: Cigarettes  . Smokeless tobacco: Not on file  . Alcohol Use: No     Comment: social   Family History  Problem Relation Age of Onset  . Hypertension Father    No Known Allergies No current outpatient prescriptions on file prior to visit.   No current facility-administered medications on file prior to visit.    Review of Systems Review of Systems  Constitutional: Negative for fever, appetite change, fatigue and unexpected weight change.  Eyes: Negative for pain and visual disturbance.  Respiratory: Negative for cough and shortness of breath.   Cardiovascular: Negative for cp or palpitations    Gastrointestinal: Negative for nausea, diarrhea and constipation.  Genitourinary: Negative for urgency and frequency. neg for dysuria or hematuria  Skin: Negative  for pallor or rash   Neurological: Negative for weakness, light-headedness, numbness and headaches.  Hematological: Negative for adenopathy. Does not bruise/bleed easily.  Psychiatric/Behavioral:pos for depression and anxiety and panic attacks, neg for SI        Objective:   Physical Exam  Constitutional: She appears well-developed and well-nourished. No distress.  HENT:  Head: Normocephalic and atraumatic.  Right Ear: External ear normal.  Left Ear: External ear normal.  Nose: Nose normal.  Mouth/Throat: Oropharynx is clear and moist.  Eyes: Conjunctivae and EOM are normal. Pupils are equal, round, and reactive to light. Right eye exhibits no discharge. Left eye exhibits no discharge. No scleral icterus.  Neck: Normal range of motion. Neck supple. No JVD present. No thyromegaly present.  Cardiovascular: Normal rate, regular rhythm, normal heart sounds and intact distal pulses.  Exam reveals no gallop.   Pulmonary/Chest: Effort normal and breath sounds normal. No respiratory distress. She has no wheezes. She has no rales.  Abdominal: Soft. Bowel sounds are normal. She exhibits no distension and no mass. There is no tenderness. There is CVA tenderness.  Very mild L cva tenderness  Musculoskeletal: She exhibits no edema and no tenderness.  Lymphadenopathy:    She has no cervical adenopathy.  Neurological: She is alert. She has normal reflexes. No cranial nerve deficit. She exhibits normal muscle tone. Coordination normal.  Skin: Skin is warm and dry. No rash noted. No erythema. No pallor.  Psychiatric: Her speech is normal and behavior is normal. Judgment normal. Her mood appears anxious. Thought content is not paranoid. Cognition and memory are normal. She exhibits a depressed mood. She expresses no homicidal and no suicidal ideation.  Tearful and panicky at times  attentive  Good insight          Assessment & Plan:

## 2012-12-05 NOTE — Assessment & Plan Note (Addendum)
Reviewed stressors/ coping techniques/symptoms/ support sources/ tx options and side effects in detail today  Pt is in a very anxious depressed state without intent for self harm  Will try paxil -titrate to 20  Xanax for panic prn - rev habit forming pot of this Declines  counseling for now  Disc imp of journal writing F/u about 2 wk >25 min spent with face to face with patient, >50% counseling and/or coordinating care

## 2012-12-05 NOTE — Assessment & Plan Note (Signed)
Re check from hosp  No longer has n/v/d

## 2012-12-05 NOTE — Assessment & Plan Note (Signed)
Rev hosp records/labs and studies in detail with pt today  Doing much better - slt residual flank tenderness Neg ua Done with abx Disc water intake/ self care and avoidance of utis

## 2012-12-20 ENCOUNTER — Other Ambulatory Visit: Payer: Self-pay | Admitting: *Deleted

## 2012-12-20 MED ORDER — ALPRAZOLAM 0.5 MG PO TABS: 0.5000 mg | ORAL_TABLET | Freq: Two times a day (BID) | ORAL | Status: DC | PRN

## 2012-12-20 NOTE — Telephone Encounter (Signed)
Px written for call in   

## 2012-12-20 NOTE — Telephone Encounter (Signed)
Fax refill request, please advise  

## 2012-12-20 NOTE — Telephone Encounter (Signed)
Rx called in as prescribed 

## 2012-12-23 ENCOUNTER — Ambulatory Visit: Payer: BC Managed Care – PPO | Admitting: Family Medicine

## 2012-12-23 DIAGNOSIS — Z0289 Encounter for other administrative examinations: Secondary | ICD-10-CM

## 2013-01-03 ENCOUNTER — Telehealth: Payer: Self-pay

## 2013-01-03 NOTE — Telephone Encounter (Signed)
Pt left v/m checking on status of med refill; did not leave name of med and do not see recent refill request in pts chartand no pharmacy name. Left v/m for pt to cb.

## 2013-01-06 ENCOUNTER — Other Ambulatory Visit: Payer: Self-pay | Admitting: *Deleted

## 2013-01-06 MED ORDER — ALPRAZOLAM 0.5 MG PO TABS: 0.5000 mg | ORAL_TABLET | Freq: Two times a day (BID) | ORAL | Status: DC | PRN

## 2013-01-06 NOTE — Telephone Encounter (Signed)
Pt is calling for xanax refill, she says she has had to take more than to tabs occasionally ( about 3 tabs daily on some days). Pt request to be called when rx called in.

## 2013-01-06 NOTE — Telephone Encounter (Signed)
I think she had an appt she may have no showed to for anxiety to see how she is doing on the paxil  Our goal is to lessen need for xanax- so please re schedule that  prostate cancer

## 2013-01-07 NOTE — Telephone Encounter (Signed)
Pt said that her paxil is working good for her she just likes to keep the xanax to use prn. Pt didn't know her work schedule so she will call back tomorrow to schedule a f/u with Dr. Milinda Antis, Rx called in as prescribed

## 2013-01-07 NOTE — Telephone Encounter (Signed)
Pt left v/m requesting cb. 

## 2013-01-07 NOTE — Telephone Encounter (Signed)
Pt left v/m checking on status of anxiety medicine. Pt request cb.

## 2013-01-07 NOTE — Telephone Encounter (Signed)
See 01/06/13 note.

## 2013-02-03 ENCOUNTER — Encounter: Payer: Self-pay | Admitting: Radiology

## 2013-02-04 ENCOUNTER — Ambulatory Visit (INDEPENDENT_AMBULATORY_CARE_PROVIDER_SITE_OTHER): Payer: BC Managed Care – PPO | Admitting: Family Medicine

## 2013-02-04 ENCOUNTER — Encounter: Payer: Self-pay | Admitting: Family Medicine

## 2013-02-04 VITALS — BP 102/66 | HR 61 | Temp 98.0°F | Ht 62.0 in | Wt 126.0 lb

## 2013-02-04 DIAGNOSIS — F341 Dysthymic disorder: Secondary | ICD-10-CM

## 2013-02-04 MED ORDER — PAROXETINE HCL 40 MG PO TABS: 40.0000 mg | ORAL_TABLET | ORAL | Status: DC

## 2013-02-04 MED ORDER — ALPRAZOLAM 0.5 MG PO TABS: 0.5000 mg | ORAL_TABLET | Freq: Two times a day (BID) | ORAL | Status: DC | PRN
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
## 2013-02-04 NOTE — Patient Instructions (Signed)
Increase paxil to 40 mg each evening  Xanax as needed - try minimize it  Stop up front for referral to psychiatry and counseling  Update me if worse or side effects

## 2013-02-04 NOTE — Progress Notes (Signed)
Pre-visit discussion using our clinic review tool. No additional management support is needed unless otherwise documented below in the visit note.  

## 2013-02-05 NOTE — Assessment & Plan Note (Signed)
Not improved on paxil 20 so far (except for sleep) Still needing significant xanax to function Reviewed stressors/ coping techniques/symptoms/ support sources/ tx options and side effects in detail today - no SI Will inc paxil to 40 mg- will watch for side eff  Ref to psychiatry for further eval and tx  Ref to psychology for counseling/ stressors  >25 min spent with face to face with patient, >50% counseling and/or coordinating care

## 2013-02-20 ENCOUNTER — Ambulatory Visit: Payer: BC Managed Care – PPO | Admitting: Psychology

## 2013-03-06 ENCOUNTER — Other Ambulatory Visit: Payer: Self-pay

## 2013-03-06 ENCOUNTER — Ambulatory Visit (INDEPENDENT_AMBULATORY_CARE_PROVIDER_SITE_OTHER): Payer: BC Managed Care – PPO | Admitting: Psychology

## 2013-03-06 DIAGNOSIS — F4323 Adjustment disorder with mixed anxiety and depressed mood: Secondary | ICD-10-CM

## 2013-03-06 MED ORDER — ALPRAZOLAM 0.5 MG PO TABS: 0.5000 mg | ORAL_TABLET | Freq: Two times a day (BID) | ORAL | Status: DC | PRN

## 2013-03-06 NOTE — Telephone Encounter (Signed)
Pt left note requesting refill alprazolam to [facility] [address]. Please advise.

## 2013-03-06 NOTE — Telephone Encounter (Signed)
Px written for call in   

## 2013-03-07 NOTE — Telephone Encounter (Signed)
Rx called in as prescribed 

## 2013-03-11 ENCOUNTER — Encounter: Payer: Self-pay | Admitting: Family Medicine

## 2013-03-20 ENCOUNTER — Ambulatory Visit: Payer: BC Managed Care – PPO | Admitting: Psychology

## 2013-04-05 ENCOUNTER — Other Ambulatory Visit: Payer: Self-pay | Admitting: Family Medicine

## 2013-04-07 NOTE — Telephone Encounter (Signed)
Pt checking status of alprazolam refill; adv ised done and pt will ck with pharmacy.

## 2013-04-07 NOTE — Telephone Encounter (Signed)
Electronic refill request, please advise  

## 2013-04-07 NOTE — Telephone Encounter (Signed)
Px written for call in   

## 2013-04-07 NOTE — Telephone Encounter (Signed)
Rx called in as prescribed 

## 2013-05-07 ENCOUNTER — Emergency Department (HOSPITAL_COMMUNITY): Payer: BC Managed Care – PPO

## 2013-05-07 ENCOUNTER — Encounter (HOSPITAL_COMMUNITY): Payer: Self-pay | Admitting: Emergency Medicine

## 2013-05-07 ENCOUNTER — Emergency Department (HOSPITAL_COMMUNITY)
Admission: EM | Admit: 2013-05-07 | Discharge: 2013-05-07 | Disposition: A | Discharge status: Home / Self Care | Payer: BC Managed Care – PPO | Attending: Emergency Medicine | Admitting: Emergency Medicine

## 2013-05-07 DIAGNOSIS — Z79899 Other long term (current) drug therapy: Secondary | ICD-10-CM | POA: Insufficient documentation

## 2013-05-07 DIAGNOSIS — Z5189 Encounter for other specified aftercare: Secondary | ICD-10-CM

## 2013-05-07 DIAGNOSIS — Z4801 Encounter for change or removal of surgical wound dressing: Secondary | ICD-10-CM | POA: Insufficient documentation

## 2013-05-07 DIAGNOSIS — R000 Tachycardia, unspecified: Secondary | ICD-10-CM | POA: Insufficient documentation

## 2013-05-07 DIAGNOSIS — F17200 Nicotine dependence, unspecified, uncomplicated: Secondary | ICD-10-CM | POA: Insufficient documentation

## 2013-05-07 DIAGNOSIS — S6992XA Unspecified injury of left wrist, hand and finger(s), initial encounter: Secondary | ICD-10-CM

## 2013-05-07 MED ORDER — OXYCODONE-ACETAMINOPHEN 5-325 MG PO TABS
1.0000 | ORAL_TABLET | Freq: Once | ORAL | Status: AC
  Administered 2013-05-07: 1 via ORAL
  Filled 2013-05-07: qty 1

## 2013-05-07 MED ORDER — OXYCODONE-ACETAMINOPHEN 5-325 MG PO TABS: 1.0000 | ORAL_TABLET | ORAL | Status: DC | PRN

## 2013-05-07 MED ORDER — BACITRACIN ZINC 500 UNIT/GM EX OINT
TOPICAL_OINTMENT | CUTANEOUS | Status: AC
  Administered 2013-05-07: 1
  Filled 2013-05-07: qty 0.9

## 2013-05-07 MED ORDER — ONDANSETRON HCL 4 MG PO TABS: 4.0000 mg | ORAL_TABLET | Freq: Four times a day (QID) | ORAL | Status: DC

## 2013-05-07 NOTE — ED Notes (Signed)
Pt states sutures were placed to left middle finger April 1 while at the beach. States they are were to come out at 7-14 days. Pt states throbbing to the  Finger x 2-3 days and is worse today

## 2013-05-07 NOTE — Discharge Instructions (Signed)
Your x-ray today does not show a fracture. You do need to start back taking the antibiotics. I will give you medication for nausea to take before you take the antibiotic. Call Dr. Ronie SpiesWeingold's office to schedule follow up. Return here as needed. Do not take the narcotic if you are driving as it will make you sleepy. Take ibuprofen for pain and inflammation in addition to the narcotic.

## 2013-05-07 NOTE — ED Provider Notes (Signed)
CSN: [id number]     Arrival date & time 05/07/13  1423 History   First MD Initiated Contact with Patient 05/07/13 1430     Chief Complaint  Patient presents with  . Wound Check     (Consider location/radiation/quality/duration/timing/severity/associated sxs/prior Treatment) Patient is a 33 y.o. female presenting with wound check. The history is provided by the patient.  Wound Check This is a new problem. The current episode started in the past 7 days. The problem occurs constantly. The problem has been gradually worsening.   Shannon Dyer is a 33 y.o. female who presents to the ED with pain in her left middle finger. She was at the beach on April 1st and her finger got slammed in a heavy metal door. She went to the ED and they sutured the finger and gave her antibiotics. She had to stop the Keflex for a few days due to nausea but has tried to start taking them again. Her finger has continued to hurt and the pain is worse. Sutures are still in place.  Past Medical History  Diagnosis Date  . Tobacco abuse    Past Surgical History  Procedure Laterality Date  . Cystic ovarian mass  01/2005   Family History  Problem Relation Age of Onset  . Hypertension Father    History  Substance Use Topics  . Smoking status: Light Tobacco Smoker -- 0.25 packs/day    Types: Cigarettes  . Smokeless tobacco: Not on file  . Alcohol Use: Yes     Comment: social   OB History   Grav Para Term Preterm Abortions TAB SAB Ect Mult Living   0              Review of Systems Negative except as stated in HPI   Allergies  Review of patient's allergies indicates no known allergies.  Home Medications   Current Outpatient Rx  Name  Route  Sig  Dispense  Refill  . ALPRAZolam (XANAX) 0.5 MG tablet      TAKE 1` TABLET 2 TIMES DAILY AS NEEDED FOR ANXIETY NOT WHILEDRIVING OR WORKING   60 tablet   0   . PARoxetine (PAXIL) 40 MG tablet   Oral   Take 1 tablet (40 mg total) by mouth every morning.  30 tablet   3    BP 119/63  Pulse 119  Temp(Src) 97.4 F (36.3 C) (Oral)  Resp 18  SpO2 100%  LMP 05/07/2013 Physical Exam  Nursing note and vitals reviewed. Constitutional: She is oriented to person, place, and time. She appears well-developed and well-nourished. No distress.  HENT:  Head: Normocephalic.  Eyes: EOM are normal.  Neck: Neck supple.  Cardiovascular: Tachycardia present.   Pulmonary/Chest: Effort normal.  Musculoskeletal:       Hands: Swelling, ecchymosis and tenderness from the PIP to the tip of the finger. Laceration of the nail noted.  Neurological: She is alert and oriented to person, place, and time. No cranial nerve deficit.  Skin: Skin is warm and dry.  Psychiatric: She has a normal mood and affect. Her behavior is normal.    ED Course  Procedures  Dg Finger Middle Left  05/07/2013   CLINICAL DATA:  Wound check  EXAM: LEFT MIDDLE FINGER 2+V  COMPARISON:  None.  FINDINGS: There is no evidence of fracture or dislocation. There is no evidence of arthropathy or other focal bone abnormality. Soft tissues are unremarkable.  IMPRESSION: Negative.   Electronically Signed   By: Janell Quiet.D.  On: 05/07/2013 15:31    MDM  33 y.o. female with crush injury to the left middle finger that happened last week while on vacation. Sutures removed. Patient stable for discharge without neurovascular deficits. She will start back on her Keflex and I will add medication for nausea and pain. She will follow up with Dr. Mina MarbleWeingold. Discussed with the patient and all questioned fully answered.    Medication List    TAKE these medications       ondansetron 4 MG tablet  Commonly known as:  ZOFRAN  Take 1 tablet (4 mg total) by mouth every 6 (six) hours.     oxyCODONE-acetaminophen 5-325 MG per tablet  Commonly known as:  ROXICET  Take 1 tablet by mouth every 4 (four) hours as needed for severe pain.      ASK your doctor about these medications       ALPRAZolam  0.5 MG tablet  Commonly known as:  XANAX  TAKE 1` TABLET 2 TIMES DAILY AS NEEDED FOR ANXIETY NOT WHILEDRIVING OR WORKING     cephALEXin 500 MG capsule  Commonly known as:  KEFLEX  Take 500 mg by mouth 4 (four) times daily.     PARoxetine 40 MG tablet  Commonly known as:  PAXIL  Take 1 tablet (40 mg total) by mouth every morning.           [facility] Orlene OchM Myrth Dahan, [place] 05/07/13 1553

## 2013-05-09 NOTE — ED Provider Notes (Signed)
Medical screening examination/treatment/procedure(s) were performed by non-physician practitioner and as supervising physician I was immediately available for consultation/collaboration.     Geoffery Lyonsouglas Tesslyn Baumert, MD 05/09/13 1101

## 2013-05-12 ENCOUNTER — Other Ambulatory Visit: Payer: Self-pay | Admitting: *Deleted

## 2013-05-12 NOTE — Telephone Encounter (Signed)
I referred her to psychiatry at last visit here - and so she should have had an appt by now - they should be filling her psych meds  Please confirm that with her and let me know what is going on -thanks

## 2013-05-12 NOTE — Telephone Encounter (Signed)
Fax refill request, please advise  

## 2013-05-13 ENCOUNTER — Other Ambulatory Visit: Payer: Self-pay | Admitting: *Deleted

## 2013-05-13 NOTE — Telephone Encounter (Signed)
Rx declined, spoke with pt and she has seen a psychiatrist, I advise pt that they have to start filling pt's Rx and she needs to call them and request that they refill Rx, pt verbalized understanding

## 2013-05-13 NOTE — Telephone Encounter (Signed)
I can refill to get her through to first appt

## 2013-05-13 NOTE — Telephone Encounter (Signed)
Pt left vm stating that she spoke with Dr. Maryruth BunKapur and was told since she is not a patient yet that Dr. Milinda Antisower needs to refill her anxiety meds.

## 2013-05-13 NOTE — Telephone Encounter (Signed)
Ok - please let me know when her first appt with Dr Maryruth BunKapur -thanks

## 2013-05-13 NOTE — Telephone Encounter (Signed)
Last filled 04/07/13, last ov 02/04/13.

## 2013-05-14 NOTE — Telephone Encounter (Signed)
Please ask her when her first appt is with Dr Maryruth BunKapur -thanks

## 2013-05-14 NOTE — Telephone Encounter (Signed)
Left voicemail requesting pt to call office 

## 2013-05-15 NOTE — Telephone Encounter (Signed)
Since it is tomorrow am - I would have Dr Maryruth BunKapur take it over at that time

## 2013-05-15 NOTE — Telephone Encounter (Signed)
Pt advise to have Dr. Maryruth BunKapur take over Rx of xanax when she is seen tomorrow

## 2013-05-15 NOTE — Telephone Encounter (Signed)
Pt has her 1st appt with Dr. Maryruth BunKapur tomorrow at 9:30am

## 2013-05-19 ENCOUNTER — Other Ambulatory Visit: Payer: Self-pay | Admitting: *Deleted

## 2013-05-19 NOTE — Telephone Encounter (Signed)
Last filled 04/07/13

## 2013-05-19 NOTE — Telephone Encounter (Signed)
She was ref to psychiatry for this - was supposed to have and appt  Please deny it

## 2013-05-20 NOTE — Telephone Encounter (Signed)
You received the letter saying pt cancelled her appt with Dr. Maryruth BunKapur through their answering service, and they will be unable to reschedule this pt, so pt isn't seeing psychiatry, please advise   (I have the letter and I have mailed a copy to pt, per Dr. Milinda Antisower

## 2013-05-20 NOTE — Telephone Encounter (Signed)
She needs to see psychiatry- why did she cancel the appt ?

## 2013-05-21 MED ORDER — ALPRAZOLAM 0.5 MG PO TABS: ORAL_TABLET | ORAL | Status: DC

## 2013-05-21 NOTE — Telephone Encounter (Signed)
Thanks for letting me know  Will refill for 2 weeks while she gets another appt

## 2013-05-21 NOTE — Telephone Encounter (Signed)
Rx called in as prescribed.   Pt said she called Dr. Shelda AltesKapur's office and since she missed her 1st appt they don't reschedule, Dr. Maryruth BunKapur advise pt that we should pt in a referral for her to see Dr. Imogene Burnhen or another psychiatrist that is accepting new pt's.  (if possible please put referral in as urgent b/c pt needs to see someone asap)
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
## 2013-09-09 ENCOUNTER — Telehealth: Payer: Self-pay

## 2013-09-09 ENCOUNTER — Encounter (HOSPITAL_COMMUNITY): Payer: Self-pay | Admitting: Emergency Medicine

## 2013-09-09 ENCOUNTER — Emergency Department (HOSPITAL_COMMUNITY)
Admission: EM | Admit: 2013-09-09 | Discharge: 2013-09-09 | Disposition: A | Discharge status: Home / Self Care | Payer: BC Managed Care – PPO | Attending: Emergency Medicine | Admitting: Emergency Medicine

## 2013-09-09 DIAGNOSIS — Z79899 Other long term (current) drug therapy: Secondary | ICD-10-CM | POA: Diagnosis not present

## 2013-09-09 DIAGNOSIS — F3289 Other specified depressive episodes: Secondary | ICD-10-CM | POA: Insufficient documentation

## 2013-09-09 DIAGNOSIS — F329 Major depressive disorder, single episode, unspecified: Secondary | ICD-10-CM | POA: Insufficient documentation

## 2013-09-09 DIAGNOSIS — F1320 Sedative, hypnotic or anxiolytic dependence, uncomplicated: Secondary | ICD-10-CM | POA: Diagnosis present

## 2013-09-09 DIAGNOSIS — F17200 Nicotine dependence, unspecified, uncomplicated: Secondary | ICD-10-CM | POA: Diagnosis not present

## 2013-09-09 LAB — CBC
HEMATOCRIT: 43.1 % (ref 36.0–46.0)
HEMOGLOBIN: 14.7 g/dL (ref 12.0–15.0)
MCH: 32.6 pg (ref 26.0–34.0)
MCHC: 34.1 g/dL (ref 30.0–36.0)
MCV: 95.6 fL (ref 78.0–100.0)
Platelets: 238 10*3/uL (ref 150–400)
RBC: 4.51 MIL/uL (ref 3.87–5.11)
RDW: 13.4 % (ref 11.5–15.5)
WBC: 7 10*3/uL (ref 4.0–10.5)

## 2013-09-09 LAB — SALICYLATE LEVEL

## 2013-09-09 LAB — COMPREHENSIVE METABOLIC PANEL
ALBUMIN: 4 g/dL (ref 3.5–5.2)
ALT: 8 U/L (ref 0–35)
ANION GAP: 12 (ref 5–15)
AST: 15 U/L (ref 0–37)
Alkaline Phosphatase: 86 U/L (ref 39–117)
BILIRUBIN TOTAL: 0.2 mg/dL — AB (ref 0.3–1.2)
BUN: 14 mg/dL (ref 6–23)
CHLORIDE: 100 meq/L (ref 96–112)
CO2: 26 meq/L (ref 19–32)
Calcium: 9.5 mg/dL (ref 8.4–10.5)
Creatinine, Ser: 0.87 mg/dL (ref 0.50–1.10)
GFR calc Af Amer: >90 mL/min (ref >90)
GFR, EST NON AFRICAN AMERICAN: 87 mL/min — AB (ref >90)
GLUCOSE: 101 mg/dL — AB (ref 70–99)
Potassium: 4 mEq/L (ref 3.7–5.3)
Sodium: 138 mEq/L (ref 137–147)
Total Protein: 7.9 g/dL (ref 6.0–8.3)

## 2013-09-09 LAB — RAPID URINE DRUG SCREEN, HOSP PERFORMED
Amphetamines: NOT DETECTED
Barbiturates: NOT DETECTED
Benzodiazepines: POSITIVE — AB
Cocaine: NOT DETECTED
OPIATES: NOT DETECTED
Tetrahydrocannabinol: NOT DETECTED

## 2013-09-09 LAB — ETHANOL: Alcohol, Ethyl (B): <11 mg/dL (ref 0–11)

## 2013-09-09 LAB — ACETAMINOPHEN LEVEL: Acetaminophen (Tylenol), Serum: <15 ug/mL (ref 10–30)

## 2013-09-09 NOTE — Discharge Instructions (Signed)
Benzodiazepine Withdrawal  °Benzodiazepines are a group of drugs that are prescribed for both short-term and long-term treatment of a variety of medical conditions. For some of these conditions, such as seizures and sudden and severe muscle spasms, they are used only for a few hours or a few days. For other conditions, such as anxiety, sleep problems, or frequent muscle spasms or to help prevent seizures, they are used for an extended period, usually weeks or months. °Benzodiazepines work by changing the way your brain functions. Normally, chemicals in your brain called neurotransmitters send messages between your brain cells. The neurotransmitter that benzodiazepines affect is called gamma-aminobutyric acid (GABA). GABA sends out messages that have a calming effect on many of the functions of your brain. Benzodiazepines make these messages stronger and increase this calming effect. °Short-term use of benzodiazepines usually does not cause problems when you stop taking the drugs. However, if you take benzodiazepines for a long time, your body can adjust to the drug and require more of it to produce the same effect (drug tolerance). Eventually, you can develop physical dependence on benzodiazepines, which is when you experience negative effects if your dosage of benzodiazepines is reduced or stopped too quickly. These negative effects are called symptoms of withdrawal. °SYMPTOMS °Symptoms of withdrawal may begin anytime within the first 10 days after you stop taking the benzodiazepine. They can last from several weeks up to a few months but usually are the worst between the first 10 to 14 days.  °The actual symptoms also vary, depending on the type of benzodiazepine you take. Possible symptoms include: °· Anxiety. °· Excitability. °· Irritability. °· Depression. °· Mood swings. °· Trouble sleeping. °· Confusion. °· Uncontrollable shaking (tremors). °· Muscle weakness. °· Seizures. °DIAGNOSIS °To diagnose  benzodiazepine withdrawal, your caregiver will examine you for certain signs, such as: °· Rapid heartbeat. °· Rapid breathing. °· Tremors. °· High blood pressure. °· Fever. °· Mood changes. °Your caregiver also may ask the following questions about your use of benzodiazepines: °· What type of benzodiazepine did you take? °· How much did you take each day? °· How long did you take the drug? °· When was the last time you took the drug? °· Do you take any other drugs? °· Have you had alcohol recently? °· Have you had a seizure recently? °· Have you lost consciousness recently? °· Have you had trouble remembering recent events? °· Have you had a recent increase in anxiety, irritability, or trouble sleeping? °A drug test also may be administered. °TREATMENT °The treatment for benzodiazepine withdrawal can vary, depending on the type and severity of your symptoms, what type of benzodiazepine you have been taking, and how long you have been taking the benzodiazepine. Sometimes it is necessary for you to be treated in a hospital, especially if you are at risk of seizures.  °Often, treatment includes a prescription for a long-acting benzodiazepine, the dosage of which is reduced slowly over a long period. This period could be several weeks or months. Eventually, your dosage will be reduced to a point that you can stop taking the drug, without experiencing withdrawal symptoms. This is called tapered withdrawal. Occasionally, minor symptoms of withdrawal continue for a few days or weeks after you have completed a tapered withdrawal. °SEEK IMMEDIATE MEDICAL CARE IF: °· You have a seizure. °· You develop a craving for drugs or alcohol. °· You begin to experience symptoms of withdrawal during your tapered withdrawal. °· You become very confused. °· You lose consciousness. °· You   have trouble breathing. °· You think about hurting yourself or someone else. °Document Released: 01/05/2011 Document Revised: 04/10/2011 Document  Reviewed: 01/05/2011 °ExitCare® Patient Information ©2015 [employer]. This information is not intended to replace advice given to you by your health care provider. Make sure you discuss any questions you have with your health care provider. ° °

## 2013-09-09 NOTE — Telephone Encounter (Signed)
Pt left v/m wanting to get Dr Royden Purlower's advice on where pt could go for detox from benzo. Pt request cb. Tried to call pt back for more info but no answer.

## 2013-09-09 NOTE — ED Notes (Signed)
Pt wants detox from benzos. Was seen at outside facility, sent to [facility] and was told to come over here for medical clearance.

## 2013-09-09 NOTE — ED Provider Notes (Signed)
CSN: [id number]     Arrival date & time 09/09/13  1601 History   First MD Initiated Contact with Patient 09/09/13 2032     Chief Complaint  Patient presents with  . detox    HPI Comments: Patient is a 33 y.o. Female who presents to the ED for detox from benzodiazepines.  Patient states that she was sent her by a Doctor who is doing a Financial planner.  Patient states that she was given remeron, clonazepam, and wellbutrin for her depression.  Patient states that she has not been taking her clonazepam for the past couple weeks.  Patient states that she is not really sure why she is here today.  Patient states that she has no suicidal or homicidal ideations at this time.  Patient states that she has never had a history of seizures in the past.  She denies fever, chills, nausea, vomiting, abdominal pain.    The history is provided by the patient. No language interpreter was used.    Past Medical History  Diagnosis Date  . Tobacco abuse    Past Surgical History  Procedure Laterality Date  . Cystic ovarian mass  01/2005   Family History  Problem Relation Age of Onset  . Hypertension Father    History  Substance Use Topics  . Smoking status: Light Tobacco Smoker -- 0.25 packs/day    Types: Cigarettes  . Smokeless tobacco: Not on file  . Alcohol Use: Yes     Comment: social   OB History   Grav Para Term Preterm Abortions TAB SAB Ect Mult Living   0              Review of Systems See HPI   Allergies  Review of patient's allergies indicates no known allergies.  Home Medications   Prior to Admission medications   Medication Sig Start Date End Date Taking? Authorizing Provider  buPROPion (WELLBUTRIN XL) 150 MG 24 hr tablet Take 300 mg by mouth every morning.   Yes Historical Provider, MD  mirtazapine (REMERON) 45 MG tablet Take 45 mg by mouth at bedtime.   Yes Historical Provider, MD   BP 110/76  Pulse 78  Temp(Src) 98.3 F (36.8 C) (Oral)  Resp 16  SpO2 100%  LMP  08/09/2013 Physical Exam  Nursing note and vitals reviewed. Constitutional: She is oriented to person, place, and time. She appears well-developed and well-nourished. No distress.  HENT:  Head: Normocephalic and atraumatic.  Mouth/Throat: Oropharynx is clear and moist. No oropharyngeal exudate.  Eyes: Conjunctivae and EOM are normal. Pupils are equal, round, and reactive to light. No scleral icterus.  Neck: Normal range of motion. Neck supple. No JVD present. No thyromegaly present.  Cardiovascular: Normal rate, regular rhythm and intact distal pulses.  Exam reveals no gallop and no friction rub.   No murmur heard. Pulmonary/Chest: Effort normal and breath sounds normal. No respiratory distress. She has no wheezes. She has no rales. She exhibits no tenderness.  Abdominal: Soft. Bowel sounds are normal. She exhibits no distension and no mass. There is no tenderness. There is no rebound and no guarding.  Musculoskeletal: Normal range of motion.  Lymphadenopathy:    She has no cervical adenopathy.  Neurological: She is alert and oriented to person, place, and time. No cranial nerve deficit. Coordination normal.  Skin: Skin is warm and dry. She is not diaphoretic.  Psychiatric: She has a normal mood and affect. Her behavior is normal. Judgment and thought content normal.  ED Course  Procedures (including critical care time) Labs Review Labs Reviewed  COMPREHENSIVE METABOLIC PANEL - Abnormal; Notable for the following:    Glucose, Bld 101 (*)    Total Bilirubin 0.2 (*)    GFR calc non Af Amer 87 (*)    All other components within normal limits  SALICYLATE LEVEL - Abnormal; Notable for the following:    Salicylate Lvl <2.0 (*)    All other components within normal limits  URINE RAPID DRUG SCREEN (HOSP PERFORMED) - Abnormal; Notable for the following:    Benzodiazepines POSITIVE (*)    All other components within normal limits  ACETAMINOPHEN LEVEL  CBC  ETHANOL    Imaging  Review No results found.   EKG Interpretation None      MDM   Final diagnoses:  Benzodiazepine dependence   Patient is a 33 y.o. Female who presents to the ED with request for benzodiazepine detox.  Patient is stable at this time and patient has not been on this medication for longterm use.  I have recommended that the patient see her PCP and have her dose gradually decreased.  Patient states understanding and agreement at this time.  Patient to return for benzodiazepine withdrawal symptoms of seizure.  Patient is stable for discharge.       Eben Burowourtney A Forcucci, PA-C 09/09/13 2230

## 2013-09-10 NOTE — Telephone Encounter (Signed)
Shirlee LimerickMarion- do you know anything about services for drug withdrawal/ treatment? [place]- let her know I am looking into it

## 2013-09-11 NOTE — Telephone Encounter (Signed)
Thanks - please give her that phone number- I know she already went to the ER about this as well and she can also follow up with me to discuss a plan for weaning her off the medication

## 2013-09-11 NOTE — Telephone Encounter (Signed)
Spoke with Shirlee LimerickMarion and [phone], Shirlee LimerickMarion said that we don't put in referrals in for this place the pt just had to call them directly.  Called pt and no answer so left voicemail requesting pt to call office back

## 2013-09-16 NOTE — ED Provider Notes (Signed)
Medical screening examination/treatment/procedure(s) were conducted as a shared visit with non-physician practitioner(s) and myself.  I personally evaluated the patient during the encounter.  Pt to ED w hx bzd use, and anxiety.  Pt denies abuse meds, or wanting to stop abruptly.  While in ED pt calm, alert, cooperative. Provider/pA discussed plan of close pcp follow up, with discussion of plan to slowly decrease dose/slowly taper off.     Suzi RootsKevin E Trivia Heffelfinger, MD 09/16/13 [id number]

## 2013-09-18 NOTE — Telephone Encounter (Signed)
Pt never responded to my messages, letter mailed requesting pt to call office or schedule appt with Dr. Milinda Antisower

## 2013-11-19 ENCOUNTER — Encounter: Payer: Self-pay | Admitting: Family Medicine

## 2013-11-19 ENCOUNTER — Telehealth: Payer: Self-pay

## 2013-11-19 ENCOUNTER — Ambulatory Visit (INDEPENDENT_AMBULATORY_CARE_PROVIDER_SITE_OTHER): Payer: BC Managed Care – PPO | Admitting: Family Medicine

## 2013-11-19 VITALS — BP 112/66 | HR 81 | Temp 98.1°F | Ht 62.0 in | Wt 149.0 lb

## 2013-11-19 DIAGNOSIS — S86112A Strain of other muscle(s) and tendon(s) of posterior muscle group at lower leg level, left leg, initial encounter: Secondary | ICD-10-CM

## 2013-11-19 DIAGNOSIS — S86812A Strain of other muscle(s) and tendon(s) at lower leg level, left leg, initial encounter: Secondary | ICD-10-CM

## 2013-11-19 MED ORDER — ACETAMINOPHEN-CODEINE 300-30 MG PO TABS: 1.0000 | ORAL_TABLET | Freq: Four times a day (QID) | ORAL | Status: DC | PRN

## 2013-11-19 NOTE — Telephone Encounter (Signed)
Pt left v/m; pt was seen earlier today with torn calf muscle and pt request pain med sent to [facility]. Pt request cb.

## 2013-11-19 NOTE — Telephone Encounter (Signed)
Called to [facility] [address]   Lanecia notified prescription has been called to her pharmacy.

## 2013-11-19 NOTE — Telephone Encounter (Signed)
Tylenol #3, 1 po q hours prn pain, #40, 0 ref  It should start hurting a lot less in a couple of weeks, definitely get compression  That we discussed

## 2013-11-19 NOTE — Progress Notes (Signed)
   Dr. Karleen HampshireSpencer T. Deandrea Vanpelt, MD, CAQ Sports Medicine Primary Care and Sports Medicine [address] [place], [address] Phone: [phone] Fax: [phone]  11/19/2013  Patient: Shannon Dyer, MRN: [id number], DOB: 1980/04/20, 33 y.o.  Primary Physician:  Roxy MannsMarne Tower, MD  Chief Complaint: Leg Pain  Subjective:   Shannon Dyer is a 33 y.o. very pleasant female patient who presents with the following:  DOI 11/18/2013  Yesterday, plantar flexed and felt really bad, walking slowly. Felt severe pain acutely more medially along the midpoint of her calf. Slightly swollen today and in her foot. LEFT  Medial gastroc tear  Past Medical History, Surgical History, Social History, Family History, Problem List, Medications, and Allergies have been reviewed and updated if relevant.  GEN: No fevers, chills. Nontoxic. Primarily MSK c/o today. MSK: Detailed in the HPI GI: tolerating PO intake without difficulty Neuro: No numbness, parasthesias, or tingling associated. Otherwise the pertinent positives of the ROS are noted above.   Objective:   BP 112/66  Pulse 81  Temp(Src) 98.1 F (36.7 C) (Oral)  Ht 5\' 2"  (1.575 m)  Wt 149 lb (67.586 kg)  BMI 27.25 kg/m2  SpO2 97%  LMP 10/15/2013   GEN: WDWN, NAD, Non-toxic, Alert & Oriented x 3 HEENT: Atraumatic, Normocephalic.  Ears and Nose: No external deformity. PSYCH: Normally interactive. Conversant. Not depressed or anxious appearing.  Calm demeanor.   FEET: L Echymosis: no Edema: mild ROM: full LE B Gait: minimally able to WB MT pain: no Callus pattern: none Lateral Mall: NT Medial Mall: NT Talus: NT Navicular: NT Cuboid: NT Calcaneous: NT Metatarsals: NT 5th MT: NT Phalanges: NT Achilles: NT Plantar Fascia: NT Fat Pad: NT Peroneals: NT MEDIAL CALF ALONG MSK JUNCTION IS NOTABLY TENDER TO PALPATION Post Tib: NT Great Toe: Nml motion Ant Drawer: neg ATFL: NT CFL: NT Deltoid: NT Sensation: intact    Radiology: No results found.  Assessment and Plan:   Rupture of medial head of gastrocnemius, left, initial encounter  >25 minutes spent in face to face time with patient, >50% spent in counselling or coordination of care: anatomy review, injury, plan of care Classic location NWB x 2 weeks on crutches, compression Advance as tolerated WB after this.   No ballistics until cleared.  Follow-up: Return in about 6 weeks (around 12/31/2013).  Patient Instructions  BODYHELIX  Www.bodyhelix.[email or web address]  Use website instuctions for measurement of limb to determine size.   Over the years, I have found that athletes and active people like these products a lot. Most of them cost about 40 dollars.  (I have no financial interest in this company and gain nothing from recommending their products)       Signed,  Blade Scheff T. Maks Cavallero, MD   Patient's Medications  New Prescriptions   No medications on file  Previous Medications   BUPROPION (WELLBUTRIN XL) 150 MG 24 HR TABLET    Take 300 mg by mouth every morning.   MIRTAZAPINE (REMERON) 45 MG TABLET    Take 45 mg by mouth at bedtime.  Modified Medications   No medications on file  Discontinued Medications   No medications on file

## 2013-11-19 NOTE — Patient Instructions (Signed)
BODYHELIX  Www.[email or web address].[email or web address]  Use website instuctions for measurement of limb to determine size.   Over the years, I have found that athletes and active people like these products a lot. Most of them cost about 40 dollars.  (I have no financial interest in this company and gain nothing from recommending their products)   

## 2013-11-19 NOTE — Progress Notes (Signed)
Pre visit review using our clinic review tool, if applicable. No additional management support is needed unless otherwise documented below in the visit note. 

## 2013-11-20 ENCOUNTER — Telehealth: Payer: Self-pay | Admitting: Family Medicine

## 2013-11-20 NOTE — Telephone Encounter (Signed)
emmi emailed °

## 2013-11-26 ENCOUNTER — Telehealth: Payer: Self-pay

## 2013-11-26 MED ORDER — HYDROCODONE-ACETAMINOPHEN 7.5-325 MG PO TABS: 1.0000 | ORAL_TABLET | ORAL | Status: DC | PRN

## 2013-11-26 NOTE — Telephone Encounter (Signed)
Shannon Dyer notified prescription for Hydrocodone-APAP 5-325 mg is ready to be picked up at front desk.

## 2013-11-26 NOTE — Telephone Encounter (Signed)
Pt left v/m; pt has already scheduled appt on 11/27/13 at 10 AM re; to torn calf muscle, still swollen and painful. Tylenol # 3 is not helping pain and pt request different pain med prior to appt.[facility].Please advise.

## 2013-11-26 NOTE — Telephone Encounter (Signed)
Hydrocodone-apap 5-325, 1 po q 4 hours prn pain, #40, 0 refills

## 2013-11-27 ENCOUNTER — Ambulatory Visit (INDEPENDENT_AMBULATORY_CARE_PROVIDER_SITE_OTHER): Payer: BC Managed Care – PPO | Admitting: Family Medicine

## 2013-11-27 ENCOUNTER — Encounter: Payer: Self-pay | Admitting: Family Medicine

## 2013-11-27 VITALS — BP 110/70 | HR 97 | Temp 98.1°F | Ht 62.0 in | Wt 150.0 lb

## 2013-11-27 DIAGNOSIS — S86811D Strain of other muscle(s) and tendon(s) at lower leg level, right leg, subsequent encounter: Secondary | ICD-10-CM

## 2013-11-27 DIAGNOSIS — S86111D Strain of other muscle(s) and tendon(s) of posterior muscle group at lower leg level, right leg, subsequent encounter: Secondary | ICD-10-CM

## 2013-11-27 NOTE — Progress Notes (Signed)
Pre visit review using our clinic review tool, if applicable. No additional management support is needed unless otherwise documented below in the visit note. 

## 2013-11-27 NOTE — Progress Notes (Signed)
   Dr. Karleen HampshireSpencer T. Aaren Krog, MD, CAQ Sports Medicine Primary Care and Sports Medicine [address] [place], [address] Phone: [phone] Fax: [phone]  11/27/2013  Patient: Shannon ShockChristine Moore, MRN: [id number], DOB: 12/21/1980, 33 y.o.  Primary Physician:  Roxy MannsMarne Tower, MD  Chief Complaint: Follow-up  Subjective:   Shannon ShockChristine Moore is a 33 y.o. very pleasant female patient who presents with the following:  F/u L medial gastroc tear: was having more pain and swelling and wanted me to recheck. Gave her some vicodin yesterday for pain, which has helped. Using crutches.   11/19/2013 Last OV with Hannah BeatSpencer Nat Lowenthal, MD  DOI 11/18/2013  Yesterday, plantar flexed and felt really bad, walking slowly. Felt severe pain acutely more medially along the midpoint of her calf. Slightly swollen today and in her foot. LEFT  Medial gastroc tear  Past Medical History, Surgical History, Social History, Family History, Problem List, Medications, and Allergies have been reviewed and updated if relevant.  GEN: No fevers, chills. Nontoxic. Primarily MSK c/o today. MSK: Detailed in the HPI GI: tolerating PO intake without difficulty Neuro: No numbness, parasthesias, or tingling associated. Otherwise the pertinent positives of the ROS are noted above.   Objective:   BP 110/70  Pulse 97  Temp(Src) 98.1 F (36.7 C) (Oral)  Ht 5\' 2"  (1.575 m)  Wt 150 lb (68.04 kg)  BMI 27.43 kg/m2  LMP 10/15/2013   GEN: WDWN, NAD, Non-toxic, Alert & Oriented x 3 HEENT: Atraumatic, Normocephalic.  Ears and Nose: No external deformity. PSYCH: Normally interactive. Conversant. Not depressed or anxious appearing.  Calm demeanor.   FEET: L Echymosis: no Edema: mild ROM: full LE B Gait: minimally able to WB MT pain: no Callus pattern: none Lateral Mall: NT Medial Mall: NT Talus: NT Navicular: NT Cuboid: NT Calcaneous: NT Metatarsals: NT 5th MT: NT Phalanges: NT Achilles: NT Plantar Fascia: NT Fat  Pad: NT Peroneals: NT MEDIAL CALF ALONG MSK JUNCTION IS NOTABLY TENDER TO PALPATION Post Tib: NT Great Toe: Nml motion Ant Drawer: neg ATFL: NT CFL: NT Deltoid: NT Sensation: intact   Radiology: No results found.  Assessment and Plan:   Rupture of medial head of gastrocnemius, right, subsequent encounter  NWB x 2-3 weeks on crutches, compression Advance as tolerated WB after this.   No ballistics until cleared.  Signed,  Elpidio GaleaSpencer T. Avyn Coate, MD   Patient's Medications  New Prescriptions   No medications on file  Previous Medications   ACETAMINOPHEN-CODEINE (TYLENOL/CODEINE #3) 300-30 MG PER TABLET    Take 1 tablet by mouth every 6 (six) hours as needed for pain.   BUPROPION (WELLBUTRIN XL) 150 MG 24 HR TABLET    Take 300 mg by mouth every morning.   HYDROCODONE-ACETAMINOPHEN (NORCO) 7.5-325 MG PER TABLET    Take 1 tablet by mouth every 4 (four) hours as needed for moderate pain.   MIRTAZAPINE (REMERON) 45 MG TABLET    Take 45 mg by mouth at bedtime.  Modified Medications   No medications on file  Discontinued Medications   No medications on file

## 2014-01-05 ENCOUNTER — Ambulatory Visit: Payer: BC Managed Care – PPO | Admitting: Family Medicine

## 2014-11-02 ENCOUNTER — Encounter (HOSPITAL_COMMUNITY): Payer: Self-pay | Admitting: *Deleted

## 2014-11-02 ENCOUNTER — Emergency Department (HOSPITAL_COMMUNITY)
Admission: EM | Admit: 2014-11-02 | Discharge: 2014-11-02 | Disposition: A | Discharge status: Home / Self Care | Payer: Self-pay | Attending: Emergency Medicine | Admitting: Emergency Medicine

## 2014-11-02 DIAGNOSIS — Z79899 Other long term (current) drug therapy: Secondary | ICD-10-CM | POA: Insufficient documentation

## 2014-11-02 DIAGNOSIS — K029 Dental caries, unspecified: Secondary | ICD-10-CM | POA: Insufficient documentation

## 2014-11-02 DIAGNOSIS — K047 Periapical abscess without sinus: Secondary | ICD-10-CM | POA: Insufficient documentation

## 2014-11-02 DIAGNOSIS — J3489 Other specified disorders of nose and nasal sinuses: Secondary | ICD-10-CM | POA: Insufficient documentation

## 2014-11-02 DIAGNOSIS — Z720 Tobacco use: Secondary | ICD-10-CM | POA: Insufficient documentation

## 2014-11-02 DIAGNOSIS — R112 Nausea with vomiting, unspecified: Secondary | ICD-10-CM | POA: Insufficient documentation

## 2014-11-02 MED ORDER — ONDANSETRON 4 MG PO TBDP
8.0000 mg | ORAL_TABLET | Freq: Once | ORAL | Status: AC
  Administered 2014-11-02: 8 mg via ORAL
  Filled 2014-11-02: qty 2

## 2014-11-02 MED ORDER — HYDROCODONE-ACETAMINOPHEN 5-325 MG PO TABS
1.0000 | ORAL_TABLET | Freq: Once | ORAL | Status: AC
  Administered 2014-11-02: 1 via ORAL
  Filled 2014-11-02: qty 1

## 2014-11-02 MED ORDER — NAPROXEN 500 MG PO TABS: 500.0000 mg | ORAL_TABLET | Freq: Two times a day (BID) | ORAL | Status: DC | PRN

## 2014-11-02 MED ORDER — HYDROCODONE-ACETAMINOPHEN 5-325 MG PO TABS: 1.0000 | ORAL_TABLET | Freq: Four times a day (QID) | ORAL | Status: DC | PRN

## 2014-11-02 MED ORDER — DOXYCYCLINE HYCLATE 100 MG PO CAPS: 100.0000 mg | ORAL_CAPSULE | Freq: Two times a day (BID) | ORAL | Status: DC

## 2014-11-02 MED ORDER — ONDANSETRON HCL 8 MG PO TABS: 8.0000 mg | ORAL_TABLET | Freq: Three times a day (TID) | ORAL | Status: DC | PRN

## 2014-11-02 NOTE — Discharge Instructions (Signed)
Apply warm compresses to jaw throughout the day. Take antibiotic until finished. Take naprosyn and norco as directed, as needed for pain but do not drive or operate machinery with pain medication use. Followup with a dentist is very important for ongoing evaluation and management of recurrent dental pain. Stop smoking. Call the dentist above today to schedule a follow up appointment. Return to emergency department for emergent changing or worsening symptoms.   Dental Abscess A dental abscess is a collection of infected fluid (pus) from a bacterial infection in the inner part of the tooth (pulp). It usually occurs at the end of the tooth's root.  CAUSES   Severe tooth decay.  Trauma to the tooth that allows bacteria to enter into the pulp, such as a broken or chipped tooth. SYMPTOMS   Severe pain in and around the infected tooth.  Swelling and redness around the abscessed tooth or in the mouth or face.  Tenderness.  Pus drainage.  Bad breath.  Bitter taste in the mouth.  Difficulty swallowing.  Difficulty opening the mouth.  Nausea.  Vomiting.  Chills.  Swollen neck glands. DIAGNOSIS   A medical and dental history will be taken.  An examination will be performed by tapping on the abscessed tooth.  X-rays may be taken of the tooth to identify the abscess. TREATMENT The goal of treatment is to eliminate the infection. You may be prescribed antibiotic medicine to stop the infection from spreading. A root canal may be performed to save the tooth. If the tooth cannot be saved, it may be pulled (extracted) and the abscess may be drained.  HOME CARE INSTRUCTIONS  Only take over-the-counter or prescription medicines for pain, fever, or discomfort as directed by your caregiver.  Rinse your mouth (gargle) often with salt water ( tsp salt in 8 oz [250 ml] of warm water) to relieve pain or swelling.  Do not drive after taking pain medicine (narcotics).  Do not apply heat to  the outside of your face.  Return to your dentist for further treatment as directed. SEEK MEDICAL CARE IF:  Your pain is not helped by medicine.  Your pain is getting worse instead of better. SEEK IMMEDIATE MEDICAL CARE IF:  You have a fever or persistent symptoms for more than 2-3 days.  You have a fever and your symptoms suddenly get worse.  You have chills or a very bad headache.  You have problems breathing or swallowing.  You have trouble opening your mouth.  You have swelling in the neck or around the eye. Document Released: 01/16/2005 Document Revised: 10/11/2011 Document Reviewed: 04/26/2010 [facility] Patient Information 2015 [place], [place]. This information is not intended to replace advice given to you by your health care provider. Make sure you discuss any questions you have with your health care provider.  Dental Caries Dental caries is tooth decay. This decay can cause a hole in teeth (cavity) that can get bigger and deeper over time. HOME CARE  Brush and floss your teeth. Do this at least two times a day.  Use a fluoride toothpaste.  Use a mouth rinse if told by your dentist or doctor.  Eat less sugary and starchy foods. Drink less sugary drinks.  Avoid snacking often on sugary and starchy foods. Avoid sipping often on sugary drinks.  Keep regular checkups and cleanings with your dentist.  Use fluoride supplements if told by your dentist or doctor.  Allow fluoride to be applied to teeth if told by your dentist or doctor.  Document Released: 10/26/2007 Document Revised: 06/02/2013 Document Reviewed: 01/19/2012 [facility] Patient Information 2015 [place], [place]. This information is not intended to replace advice given to you by your health care provider. Make sure you discuss any questions you have with your health care provider.   Smoking Cessation, Tips for Success If you are ready to quit smoking, congratulations! You have chosen to help yourself be  healthier. Cigarettes bring nicotine, tar, carbon monoxide, and other irritants into your body. Your lungs, heart, and blood vessels will be able to work better without these poisons. There are many different ways to quit smoking. Nicotine gum, nicotine patches, a nicotine inhaler, or nicotine nasal spray can help with physical craving. Hypnosis, support groups, and medicines help break the habit of smoking. WHAT THINGS CAN I DO TO MAKE QUITTING EASIER?  Here are some tips to help you quit for good:  Pick a date when you will quit smoking completely. Tell all of your friends and family about your plan to quit on that date.  Do not try to slowly cut down on the number of cigarettes you are smoking. Pick a quit date and quit smoking completely starting on that day.  Throw away all cigarettes.   Clean and remove all ashtrays from your home, work, and car.  On a card, write down your reasons for quitting. Carry the card with you and read it when you get the urge to smoke.  Cleanse your body of nicotine. Drink enough water and fluids to keep your urine clear or pale yellow. Do this after quitting to flush the nicotine from your body.  Learn to predict your moods. Do not let a bad situation be your excuse to have a cigarette. Some situations in your life might tempt you into wanting a cigarette.  Never have "just one" cigarette. It leads to wanting another and another. Remind yourself of your decision to quit.  Change habits associated with smoking. If you smoked while driving or when feeling stressed, try other activities to replace smoking. Stand up when drinking your coffee. Brush your teeth after eating. Sit in a different chair when you read the paper. Avoid alcohol while trying to quit, and try to drink fewer caffeinated beverages. Alcohol and caffeine may urge you to smoke.  Avoid foods and drinks that can trigger a desire to smoke, such as sugary or spicy foods and alcohol.  Ask people who  smoke not to smoke around you.  Have something planned to do right after eating or having a cup of coffee. For example, plan to take a walk or exercise.  Try a relaxation exercise to calm you down and decrease your stress. Remember, you may be tense and nervous for the first 2 weeks after you quit, but this will pass.  Find new activities to keep your hands busy. Play with a pen, coin, or rubber band. Doodle or draw things on paper.  Brush your teeth right after eating. This will help cut down on the craving for the taste of tobacco after meals. You can also try mouthwash.   Use oral substitutes in place of cigarettes. Try using lemon drops, carrots, cinnamon sticks, or chewing gum. Keep them handy so they are available when you have the urge to smoke.  When you have the urge to smoke, try deep breathing.  Designate your home as a nonsmoking area.  If you are a heavy smoker, ask your health care provider about a prescription for nicotine chewing gum. It can  ease your withdrawal from nicotine.  Reward yourself. Set aside the cigarette money you save and buy yourself something nice.  Look for support from others. Join a support group or smoking cessation program. Ask someone at home or at work to help you with your plan to quit smoking.  Always ask yourself, "Do I need this cigarette or is this just a reflex?" Tell yourself, "Today, I choose not to smoke," or "I do not want to smoke." You are reminding yourself of your decision to quit.  Do not replace cigarette smoking with electronic cigarettes (commonly called e-cigarettes). The safety of e-cigarettes is unknown, and some may contain harmful chemicals.  If you relapse, do not give up! Plan ahead and think about what you will do the next time you get the urge to smoke. HOW WILL I FEEL WHEN I QUIT SMOKING? You may have symptoms of withdrawal because your body is used to nicotine (the addictive substance in cigarettes). You may crave  cigarettes, be irritable, feel very hungry, cough often, get headaches, or have difficulty concentrating. The withdrawal symptoms are only temporary. They are strongest when you first quit but will go away within 10-14 days. When withdrawal symptoms occur, stay in control. Think about your reasons for quitting. Remind yourself that these are signs that your body is healing and getting used to being without cigarettes. Remember that withdrawal symptoms are easier to treat than the major diseases that smoking can cause.  Even after the withdrawal is over, expect periodic urges to smoke. However, these cravings are generally short lived and will go away whether you smoke or not. Do not smoke! WHAT RESOURCES ARE AVAILABLE TO HELP ME QUIT SMOKING? Your health care provider can direct you to community resources or hospitals for support, which may include:  Group support.  Education.  Hypnosis.  Therapy. Document Released: 10/15/2003 Document Revised: 06/02/2013 Document Reviewed: 07/04/2012 [facility] Patient Information 2015 [place], [place]. This information is not intended to replace advice given to you by your health care provider. Make sure you discuss any questions you have with your health care provider.  Emergency Department Resource Guide 1) Find a Doctor and Pay Out of Pocket Although you won't have to find out who is covered by your insurance plan, it is a good idea to ask around and get recommendations. You will then need to call the office and see if the doctor you have chosen will accept you as a new patient and what types of options they offer for patients who are self-pay. Some doctors offer discounts or will set up payment plans for their patients who do not have insurance, but you will need to ask so you aren't surprised when you get to your appointment.  2) Contact Your Local Health Department Not all health departments have doctors that can see patients for sick visits, but many do, so  it is worth a call to see if yours does. If you don't know where your local health department is, you can check in your phone book. The CDC also has a tool to help you locate your state's health department, and many state websites also have listings of all of their local health departments.  3) Find a Walk-in Clinic If your illness is not likely to be very severe or complicated, you may want to try a walk in clinic. These are popping up all over the country in pharmacies, drugstores, and shopping centers. They're usually staffed by nurse practitioners or physician assistants that have  been trained to treat common illnesses and complaints. They're usually fairly quick and inexpensive. However, if you have serious medical issues or chronic medical problems, these are probably not your best option.  No Primary Care Doctor: - Call Health Connect at  [phone] - they can help you locate a primary care doctor that  accepts your insurance, provides certain services, etc. - Physician Referral Service- [phone]  Chronic Pain Problems: Organization         Address  Phone   Notes  [facility]  [phone] Patients need to be referred by their primary care doctor.   Medication Assistance: Organization         Address  Phone   Notes  [facility] Medication [facility] [address] [place] [place], [place] [address] [phone] --Must be a resident of [facility] -- Must have NO insurance coverage whatsoever (no Medicaid/ Medicare, etc.) -- The pt. MUST have a primary care doctor that directs their care regularly and follows them in the community   [facility]  [phone]   [place]  [phone]     Dental Care: Organization         Address  Phone  Notes  [facility] Department of [facility] [address] [place], [place] [phone] Accepts children up to age 45 who are enrolled in [place] or [place] [facility]  Choice; pregnant women with a Medicaid card; and children who have applied for Medicaid or [place] Health Choice, but were declined, whose parents can pay a reduced fee at time of service.  [facility] Department of [facility]  [address], [place] [phone] Accepts children up to age 5 who are enrolled in [place] or [place] Health Choice; pregnant women with a Medicaid card; and children who have applied for Medicaid or [place] Health Choice, but were declined, whose parents can pay a reduced fee at time of service.  [facility]  [address] [place], [place] [phone] Patients are seen by appointment only. Walk-ins are not accepted. [facility] will see patients 57 years of age and older. Monday - Tuesday (8am-5pm) Most Wednesdays (8:30-5pm) $30 per visit, cash only  [facility]  [address], [facility] [phone] Patients are seen by appointment only. Walk-ins are not accepted. [facility] will see patients 22 years of age and older. One Wednesday Evening (Monthly: Volunteer Based).  $30 per visit, cash only  [employer]  [phone] for adults; Children under age 61, call Graduate Pediatric Dentistry at [phone]. Children aged 34-14, please call [phone] to request a pediatric application.  Dental services are provided in all areas of dental care including fillings, crowns and bridges, complete and partial dentures, implants, gum treatment, root canals, and extractions. Preventive care is also provided. Treatment is provided to both adults and children. Patients are selected via a lottery and there is often a waiting list.   [facility] [address] [place]  [phone] [email or web address]   [facility] [address] [place], [place] [phone], Ext. [phone] Second and Fourth Thursday of each month, opens at 6:30 AM; Clinic ends at 9  AM.  Patients are seen on a first-come first-served basis, and a limited number are seen during each clinic.   [facility]  [address], [place], [place] (  [phone]   Eligibility Requirements You must have lived in [place], [place], or [place] counties for at least the last three months.   You cannot be eligible for state or federal sponsored [place], including [employer], [place], or [employer].   You generally cannot be eligible for healthcare insurance through your employer.    How to apply: Eligibility screenings are held every Tuesday and Wednesday afternoon from 1:00 pm until 4:00 pm. You do not need an appointment for the interview!  [facility] [address], [place], [place] [phone]   [facility] Health Department  [phone]   [facility] Health Department  [phone]   [facility] Health Department  [phone]

## 2014-11-02 NOTE — ED Provider Notes (Signed)
CSN: [id number]     Arrival date & time 11/02/14  7829 History  By signing my name below, I, Marica Otter, attest that this documentation has been prepared under the direction and in the presence of Reyne Falconi Camprubi-Soms, PA-C. Electronically Signed: Marica Otter, ED Scribe. 11/02/2014. 10:46 AM. Chief Complaint  Patient presents with  . Facial Swelling   Patient is a 34 y.o. female presenting with tooth pain. The history is provided by the patient. No language interpreter was used.  Dental Pain Location:  Upper Upper teeth location:  16/LU 3rd molar Quality:  Throbbing Severity:  Severe Onset quality:  Gradual Duration:  3 days Timing:  Constant Progression:  Worsening Chronicity:  New Context: abscess and dental caries   Relieved by:  Topical anesthetic gel Worsened by:  Jaw movement and pressure Ineffective treatments:  None tried Associated symptoms: facial swelling and gum swelling   Associated symptoms: no difficulty swallowing, no drooling, no fever, no neck swelling and no trismus   Risk factors: lack of dental care and smoking   Risk factors: no diabetes    PCP: Roxy Manns, MD HPI Comments: Shannon Dyer is a 33 y.o. female, with PMHx noted below including daily tobacco use and dental fracture, who presents to the Emergency Department complaining of atraumatic, gradually worsening, sharp/throbbing, 9/10, constant, left upper dental pain (left, upper molar no. 16), onset a few days ago. Pt notes chewing makes the pain worse, improved mildly with topical anesthetic. Associated Sx include: gum and facial swelling onset this morning; n/v (one episode of vomiting this morning), and pain with opening mouth. Pt denies Hx of DM or autoimmune disease. Pt further denies any gumline erythema, complete trismus, drooling, fever, chills, chest pain, SOB, d/c, abd pain, dysuria, hematuria, numbness/tingling and weakness, neck swelling, trouble swallowing, sore throat. Pt denies any  medicinal allergies. +Smoker.  Past Medical History  Diagnosis Date  . Tobacco abuse    Past Surgical History  Procedure Laterality Date  . Cystic ovarian mass  01/2005   Family History  Problem Relation Age of Onset  . Hypertension Father    Social History  Substance Use Topics  . Smoking status: Light Tobacco Smoker -- 0.25 packs/day  . Smokeless tobacco: Never Used     Comment: uses vapor cigarettes only  . Alcohol Use: Yes     Comment: social   OB History    Gravida Para Term Preterm AB TAB SAB Ectopic Multiple Living   0              Review of Systems  Constitutional: Negative for fever and chills.  HENT: Positive for dental problem, facial swelling and rhinorrhea. Negative for drooling, sore throat and trouble swallowing.   Respiratory: Negative for shortness of breath.   Cardiovascular: Negative for chest pain.  Gastrointestinal: Positive for nausea and vomiting (x1). Negative for abdominal pain, diarrhea and constipation.  Genitourinary: Negative for dysuria, hematuria and difficulty urinating.  Musculoskeletal: Negative for myalgias and arthralgias.  Skin: Negative for color change.  Allergic/Immunologic: Negative for immunocompromised state.  Neurological: Negative for weakness and numbness.   10 Systems reviewed and all are negative for acute change except as noted in the HPI.   Allergies  Review of patient's allergies indicates no known allergies.  Home Medications   Prior to Admission medications   Medication Sig Start Date End Date Taking? Authorizing Provider  Acetaminophen-Codeine (TYLENOL/CODEINE #3) 300-30 MG per tablet Take 1 tablet by mouth every 6 (six) hours as needed for  pain. 11/19/13   Hannah Beat, MD  buPROPion (WELLBUTRIN XL) 150 MG 24 hr tablet Take 300 mg by mouth every morning.    Historical Provider, MD  HYDROcodone-acetaminophen (NORCO) 7.5-325 MG per tablet Take 1 tablet by mouth every 4 (four) hours as needed for moderate pain.  11/26/13   Hannah Beat, MD  mirtazapine (REMERON) 45 MG tablet Take 45 mg by mouth at bedtime.    Historical Provider, MD   Triage Vitals: BP 117/78 mmHg  Pulse 87  Temp(Src) 98.8 F (37.1 C) (Oral)  Resp 18  SpO2 100%  LMP 11/01/2014 Physical Exam  Constitutional: She is oriented to person, place, and time. Vital signs are normal. She appears well-developed and well-nourished.  Non-toxic appearance. No distress.  Afebrile, nontoxic, NAD  HENT:  Head: Normocephalic and atraumatic.  Mouth/Throat: Uvula is midline, oropharynx is clear and moist and mucous membranes are normal. No trismus in the jaw. Dental abscesses and dental caries present. No uvula swelling.    Left, upper molar no. 16 decayed with surrounding gingival erythema and swelling, abscess noted. No evidence of lugwigs. Slight, left facial swelling. Nose clear. Oropharynx clear and moist, without uvular swelling or deviation, no trismus or drooling, no tonsillar swelling or erythema, no exudates.    Eyes: Conjunctivae and EOM are normal. Right eye exhibits no discharge. Left eye exhibits no discharge.  Neck: Normal range of motion. Neck supple.  Cardiovascular: Normal rate.   Pulmonary/Chest: Effort normal. No respiratory distress.  Abdominal: Normal appearance. She exhibits no distension.  Musculoskeletal: Normal range of motion.  Lymphadenopathy:       Head (right side): Submandibular adenopathy present.       Head (left side): Submandibular adenopathy present.    She has cervical adenopathy.  Bilateral submandibular and cervical LAD which is mildly TTP.    Neurological: She is alert and oriented to person, place, and time. She has normal strength. No sensory deficit.  Skin: Skin is warm, dry and intact. No rash noted.  Psychiatric: She has a normal mood and affect. Her behavior is normal.  Nursing note and vitals reviewed.   ED Course  Procedures (including critical care time) DIAGNOSTIC STUDIES: Oxygen  Saturation is 100% on RA, nl by my interpretation.    COORDINATION OF CARE: 10:21 AM: Discussed treatment plan which includes dental referral, meds (antibiotics, pain meds, Zofran), warm water gurgles, with pt at bedside; patient verbalizes understanding and agrees with treatment plan.  MDM   Final diagnoses:  Dental abscess  Dental decay  Tobacco abuse    34 y.o. female here with Dental pain associated with dental infection and possible dental abscess with patient afebrile, non toxic appearing and swallowing secretions well. I gave patient referral to dentist and stressed the importance of dental follow up for ultimate management of dental pain.  I have also discussed reasons to return immediately to the ER.  Patient expresses understanding and agrees with plan.  I will also give doxycycline and pain control.  Zofran given as well, pt tolerating PO well. Smoking cessation encouraged.  I personally performed the services described in this documentation, which was scribed in my presence. The recorded information has been reviewed and is accurate.  BP 117/78 mmHg  Pulse 87  Temp(Src) 98.8 F (37.1 C) (Oral)  Resp 18  SpO2 100%  LMP 11/01/2014  Meds ordered this encounter  Medications  . ondansetron (ZOFRAN-ODT) disintegrating tablet 8 mg    Sig:   . HYDROcodone-acetaminophen (NORCO/VICODIN) 5-325 MG per tablet  1 tablet    Sig:   . HYDROcodone-acetaminophen (NORCO) 5-325 MG tablet    Sig: Take 1 tablet by mouth every 6 (six) hours as needed for severe pain.    Dispense:  10 tablet    Refill:  0    Order Specific Question:  Supervising Provider    Answer:  Hyacinth Meeker, BRIAN [[id number]]  . naproxen (NAPROSYN) 500 MG tablet    Sig: Take 1 tablet (500 mg total) by mouth 2 (two) times daily as needed for mild pain, moderate pain or headache (TAKE WITH MEALS.).    Dispense:  20 tablet    Refill:  0    Order Specific Question:  Supervising Provider    Answer:  MILLER, BRIAN [[id number]]  .  doxycycline (VIBRAMYCIN) 100 MG capsule    Sig: Take 1 capsule (100 mg total) by mouth 2 (two) times daily. One po bid x 7 days    Dispense:  14 capsule    Refill:  0    Order Specific Question:  Supervising Provider    Answer:  MILLER, BRIAN [[id number]]  . ondansetron (ZOFRAN) 8 MG tablet    Sig: Take 1 tablet (8 mg total) by mouth every 8 (eight) hours as needed for nausea or vomiting.    Dispense:  10 tablet    Refill:  0    Order Specific Question:  Supervising Provider    Answer:  Eber Hong [[id number]]     Kery Batzel Camprubi-Soms, PA-C 11/02/14 [id number]  Linwood Dibbles, MD 11/06/14 [phone]

## 2014-11-02 NOTE — ED Notes (Addendum)
Pt is here with left facial swelling and not sure if she had any tooth pain.  No airway issues.

## 2014-11-02 NOTE — ED Notes (Signed)
Declined W/C at D/C and was escorted to lobby by RN. 

## 2015-02-23 ENCOUNTER — Ambulatory Visit (INDEPENDENT_AMBULATORY_CARE_PROVIDER_SITE_OTHER): Payer: Self-pay | Admitting: Internal Medicine

## 2015-02-23 ENCOUNTER — Encounter: Payer: Self-pay | Admitting: Internal Medicine

## 2015-02-23 VITALS — BP 114/74 | HR 88 | Temp 98.6°F | Wt 137.0 lb

## 2015-02-23 DIAGNOSIS — J029 Acute pharyngitis, unspecified: Secondary | ICD-10-CM

## 2015-02-23 DIAGNOSIS — J020 Streptococcal pharyngitis: Secondary | ICD-10-CM

## 2015-02-23 LAB — POCT RAPID STREP A (OFFICE): RAPID STREP A SCREEN: POSITIVE — AB

## 2015-02-23 MED ORDER — PENICILLIN V POTASSIUM 500 MG PO TABS: 500.0000 mg | ORAL_TABLET | Freq: Three times a day (TID) | ORAL | Status: DC

## 2015-02-23 NOTE — Progress Notes (Signed)
Pre visit review using our clinic review tool, if applicable. No additional management support is needed unless otherwise documented below in the visit note. 

## 2015-02-23 NOTE — Addendum Note (Signed)
Addended by: Roena Malady on: 02/23/2015 05:09 PM   Modules accepted: Orders

## 2015-02-23 NOTE — Patient Instructions (Signed)

## 2015-02-23 NOTE — Progress Notes (Signed)
Subjective:    Patient ID: Shannon Dyer, female    DOB: 08-20-1980, 35 y.o.   MRN: [id number]  HPI   Pt presents to the clinic today with c/o sore throat. This started 2 days ago. Pain is on the right side and worsening. She is having trouble swallowing, and has noticed swelling of her neck. She reports she saw a white spot in back of throat. She has had some nasal congestion and coughed up green mucus with blood once. She denies ongoing cough, eye drainage, pain or SOB. She also admits to bilateral ear pain but denies loss of hearing. She denies fever, chills or body aches. She has had trouble sleeping at night secondary to pain. She has tried warm salt water gargles and allergy pill OTC with minimal relief. She denies sick contacts. She does have a history of seasonal allergies, takes Benadryl when allergies flare. She has not had sick contacts.   Review of Systems  Past Medical History  Diagnosis Date  . Tobacco abuse     Current Outpatient Prescriptions  Medication Sig Dispense Refill  . Acetaminophen-Codeine (TYLENOL/CODEINE #3) 300-30 MG per tablet Take 1 tablet by mouth every 6 (six) hours as needed for pain. (Patient not taking: Reported on 02/23/2015) 40 tablet 0  . buPROPion (WELLBUTRIN XL) 150 MG 24 hr tablet Take 300 mg by mouth every morning. Reported on 02/23/2015    . HYDROcodone-acetaminophen (NORCO) 5-325 MG tablet Take 1 tablet by mouth every 6 (six) hours as needed for severe pain. (Patient not taking: Reported on 02/23/2015) 10 tablet 0  . HYDROcodone-acetaminophen (NORCO) 7.5-325 MG per tablet Take 1 tablet by mouth every 4 (four) hours as needed for moderate pain. (Patient not taking: Reported on 02/23/2015) 40 tablet 0  . mirtazapine (REMERON) 45 MG tablet Take 45 mg by mouth at bedtime. Reported on 02/23/2015    . naproxen (NAPROSYN) 500 MG tablet Take 1 tablet (500 mg total) by mouth 2 (two) times daily as needed for mild pain, moderate pain or headache (TAKE WITH  MEALS.). (Patient not taking: Reported on 02/23/2015) 20 tablet 0  . ondansetron (ZOFRAN) 8 MG tablet Take 1 tablet (8 mg total) by mouth every 8 (eight) hours as needed for nausea or vomiting. (Patient not taking: Reported on 02/23/2015) 10 tablet 0  . penicillin v potassium (VEETID) 500 MG tablet Take 1 tablet (500 mg total) by mouth 3 (three) times daily. 30 tablet 0   No current facility-administered medications for this visit.    No Known Allergies  Family History  Problem Relation Age of Onset  . Hypertension Father     Social History   Social History  . Marital Status: Single    Spouse Name: N/A  . Number of Children: N/A  . Years of Education: N/A   Occupational History  . Not on file.   Social History Main Topics  . Smoking status: Light Tobacco Smoker -- 0.25 packs/day  . Smokeless tobacco: Never Used     Comment: uses vapor cigarettes only  . Alcohol Use: Yes     Comment: social  . Drug Use: No  . Sexual Activity: Yes    Birth Control/ Protection: None   Other Topics Concern  . Not on file   Social History Narrative     Constitutional: Denies fever, malaise, fatigue, headache.  HEENT: Positive sore throat, nasal congestoin, ear pain and fullness. Denies eye pain or bloody nose. Respiratory: Denies difficulty breathing, shortness of breath, or ongoing  cough. Cardiovascular: Denies chest pain or chest tightness..   No other specific complaints in a complete review of systems (except as listed in HPI above).     Objective:   Physical Exam BP 114/74 mmHg  Pulse 88  Temp(Src) 98.6 F (37 C) (Oral)  Wt 137 lb (62.143 kg)  SpO2 99%  LMP 01/15/2015 Wt Readings from Last 3 Encounters:  02/23/15 137 lb (62.143 kg)  11/27/13 150 lb (68.04 kg)  11/19/13 149 lb (67.586 kg)    General: Appears her stated age,in NAD. HEENT: Head: normal shape and size, mild maxillary sinus tenderness noted; Eyes: sclera white, no icterus, conjunctiva pink; Ears: Tm's pink  but intact, positive serous effusion B/L- left greater than right; Nose: mucosa pink and moist, septum midline; Throat/Mouth: Teeth present, mucosa erythematous, tonsillar swelling with white exudate on right tonsil. Neck:  Tenderness to palpation of the right anterior cervical area but no adenopathy noted.  Cardiovascular: Normal rate and rhythm. S1,S2 noted.  No murmur, rubs or gallops noted.  Pulmonary/Chest: Normal effort and positive vesicular breath sounds. No respiratory distress. No wheezes, rales or ronchi noted.     BMET    Component Value Date/Time   NA 138 09/09/2013 1718   K 4.0 09/09/2013 1718   CL 100 09/09/2013 1718   CO2 26 09/09/2013 1718   GLUCOSE 101* 09/09/2013 1718   BUN 14 09/09/2013 1718   CREATININE 0.87 09/09/2013 1718   CALCIUM 9.5 09/09/2013 1718   GFRNONAA 87* 09/09/2013 1718   GFRAA >90 09/09/2013 1718    Lipid Panel  No results found for: CHOL, TRIG, HDL, CHOLHDL, VLDL, LDLCALC  CBC    Component Value Date/Time   WBC 7.0 09/09/2013 1718   RBC 4.51 09/09/2013 1718   HGB 14.7 09/09/2013 1718   HCT 43.1 09/09/2013 1718   PLT 238 09/09/2013 1718   MCV 95.6 09/09/2013 1718   MCH 32.6 09/09/2013 1718   MCHC 34.1 09/09/2013 1718   RDW 13.4 09/09/2013 1718   LYMPHSABS 1.1 11/21/2012 1237   MONOABS 2.0* 11/21/2012 1237   EOSABS 0.1 11/21/2012 1237   BASOSABS 0.0 11/21/2012 1237    Hgb A1C No results found for: HGBA1C        Assessment & Plan:  Strep Throat:  Positive rapid strep test Rx for Penicillin VK  TID *10days Ibuprofen and salt water gargles PRN   RTC as needed or if symptoms do not improve as anticipated

## 2015-08-11 ENCOUNTER — Encounter: Payer: Self-pay | Admitting: Family Medicine

## 2015-08-11 ENCOUNTER — Ambulatory Visit (INDEPENDENT_AMBULATORY_CARE_PROVIDER_SITE_OTHER): Payer: BLUE CROSS/BLUE SHIELD | Admitting: Family Medicine

## 2015-08-11 VITALS — BP 110/70 | HR 78 | Temp 98.5°F | Ht 62.0 in | Wt 140.8 lb

## 2015-08-11 DIAGNOSIS — M25529 Pain in unspecified elbow: Secondary | ICD-10-CM | POA: Insufficient documentation

## 2015-08-11 DIAGNOSIS — H6981 Other specified disorders of Eustachian tube, right ear: Secondary | ICD-10-CM

## 2015-08-11 DIAGNOSIS — M25522 Pain in left elbow: Secondary | ICD-10-CM

## 2015-08-11 DIAGNOSIS — H6980 Other specified disorders of Eustachian tube, unspecified ear: Secondary | ICD-10-CM | POA: Insufficient documentation

## 2015-08-11 MED ORDER — FLUTICASONE PROPIONATE 50 MCG/ACT NA SUSP: 2.0000 | Freq: Every day | NASAL | Status: DC

## 2015-08-11 NOTE — Patient Instructions (Signed)
R ear- use flonase daily and gently try to pop your ears.  Should get better in a few days.  L elbow- use a tennis elbow strap, ice your elbow for 5 min on/off a few times a day, and take 2-3 OTC ibuprofen tabs with food a few times a day.  Should gradually get better.  Take care.  Glad to see you.

## 2015-08-11 NOTE — Assessment & Plan Note (Signed)
Flonase, gently valsalva, f/u prn, d/w pt.  No sign on AOM.

## 2015-08-11 NOTE — Progress Notes (Signed)
R ear.  Pain in the ear.  No L sided sx.  No FCD.  Some nausea from the pain.  Hard hearing on R side.  Sx started yesterday.    L arm.  Pain with elbow extension.  Started about 1 week ago.   Progressively worse in the meantime.   Works at [employer], frequent lifting.   She isn't having shoulder or wrist pain.  She can fully flex the L elbow but pain with extension.  No trauma, no specific single injury. Tried ice and heat.  No help.  Tried a sleeve w/o help.    Meds, vitals, and allergies reviewed.   ROS: Per HPI unless specifically indicated in ROS section   nad ncat TM wnl B but R ETD, weber louder on the R ear.  Nasal and OP exam wnl L shoulder normal ROM.  Normal PROM L elbow flex and ext but pain with ext and resisted supination and pronation, better with compression prox forearm.  Distally nv intact.  Not bruising.  No olecranon bursitis.

## 2015-08-11 NOTE — Progress Notes (Signed)
Pre visit review using our clinic review tool, if applicable. No additional management support is needed unless otherwise documented below in the visit note. 

## 2015-08-11 NOTE — Assessment & Plan Note (Signed)
Ice, ibuprofen with GI cautions, tennis elbow strap, f/u prn.  D/w pt.  She agrees.

## 2016-02-08 ENCOUNTER — Telehealth: Payer: Self-pay | Admitting: Family Medicine

## 2016-02-08 NOTE — Telephone Encounter (Signed)
Shannon Dyer Patient Name: Shannon ShockCHRISTINE MOORE DOB: [birth year] Initial Comment Caller says, wants an appt today, has mild pain Lower Rt abd, nauseated, chills, feels clammy. Nurse Assessment Nurse: Debera Latalston, RN, Tinnie GensJeffrey Date/Time Shannon Dyer(Eastern Time): 02/08/2016 8:26:15 AM Confirm and document reason for call. If symptomatic, describe symptoms. ---Caller says, wants an appt today, has mild pain Lower Rt abd, nauseated, chills, feels clammy. Nausea started this morning. Pain for a few days. Does the patient have any new or worsening symptoms? ---Yes Will a triage be completed? ---Yes Related visit to physician within the last 2 weeks? ---No Does the PT have any chronic conditions? (i.e. diabetes, asthma, etc.) ---No Is the patient pregnant or possibly pregnant? (Ask all females between the ages of 3112-55) ---No Is this a behavioral health or substance abuse call? ---No Guidelines Guideline Title Affirmed Question Affirmed Notes Abdominal Pain - Female [1] MILD (e.g., does not interfere with normal activities) AND [2] pain comes and goes (cramps) AND [3] present > 48 hours Final Disposition User See Physician within 24 Hours Debera Latalston, RN, Abbott LaboratoriesJeffrey Referrals REFERRED TO PCP OFFICE REFERRED TO PCP OFFICE Disagree/Comply: Comply

## 2016-02-08 NOTE — Telephone Encounter (Signed)
Pt has appt with Dr Dayton MartesAron on 02/09/16 at 9 AM.

## 2016-02-09 ENCOUNTER — Ambulatory Visit (INDEPENDENT_AMBULATORY_CARE_PROVIDER_SITE_OTHER): Payer: BLUE CROSS/BLUE SHIELD | Admitting: Primary Care

## 2016-02-09 ENCOUNTER — Encounter: Payer: Self-pay | Admitting: Primary Care

## 2016-02-09 ENCOUNTER — Ambulatory Visit: Payer: Self-pay | Admitting: Family Medicine

## 2016-02-09 VITALS — BP 120/82 | HR 81 | Temp 98.1°F | Ht 62.0 in | Wt 147.1 lb

## 2016-02-09 DIAGNOSIS — R1031 Right lower quadrant pain: Secondary | ICD-10-CM | POA: Diagnosis not present

## 2016-02-09 LAB — CBC WITH DIFFERENTIAL/PLATELET
BASOS ABS: 0 10*3/uL (ref 0.0–0.1)
Basophils Relative: 0.3 % (ref 0.0–3.0)
Eosinophils Absolute: 0 10*3/uL (ref 0.0–0.7)
Eosinophils Relative: 0.1 % (ref 0.0–5.0)
HCT: 44 % (ref 36.0–46.0)
Hemoglobin: 15 g/dL (ref 12.0–15.0)
Lymphocytes Relative: 29.5 % (ref 12.0–46.0)
Lymphs Abs: 2.7 10*3/uL (ref 0.7–4.0)
MCHC: 34 g/dL (ref 30.0–36.0)
MCV: 95.8 fl (ref 78.0–100.0)
MONOS PCT: 8.1 % (ref 3.0–12.0)
Monocytes Absolute: 0.7 10*3/uL (ref 0.1–1.0)
NEUTROS ABS: 5.7 10*3/uL (ref 1.4–7.7)
Neutrophils Relative %: 62 % (ref 43.0–77.0)
PLATELETS: 329 10*3/uL (ref 150.0–400.0)
RBC: 4.59 Mil/uL (ref 3.87–5.11)
RDW: 13.5 % (ref 11.5–15.5)
WBC: 9.3 10*3/uL (ref 4.0–10.5)

## 2016-02-09 LAB — COMPREHENSIVE METABOLIC PANEL
ALT: 11 U/L (ref 0–35)
AST: 15 U/L (ref 0–37)
Albumin: 4.4 g/dL (ref 3.5–5.2)
Alkaline Phosphatase: 65 U/L (ref 39–117)
BILIRUBIN TOTAL: 0.3 mg/dL (ref 0.2–1.2)
BUN: 13 mg/dL (ref 6–23)
CALCIUM: 10.1 mg/dL (ref 8.4–10.5)
CO2: 26 meq/L (ref 19–32)
Chloride: 104 mEq/L (ref 96–112)
Creatinine, Ser: 0.83 mg/dL (ref 0.40–1.20)
GFR: 83.09 mL/min (ref >60.00)
Glucose, Bld: 100 mg/dL — ABNORMAL HIGH (ref 70–99)
POTASSIUM: 4.6 meq/L (ref 3.5–5.1)
Sodium: 137 mEq/L (ref 135–145)
Total Protein: 8.2 g/dL (ref 6.0–8.3)

## 2016-02-09 NOTE — Progress Notes (Signed)
Pre visit review using our clinic review tool, if applicable. No additional management support is needed unless otherwise documented below in the visit note. 

## 2016-02-09 NOTE — Progress Notes (Signed)
Subjective:    Patient ID: Shannon Dyer, female    DOB: Feb 17, 1980, 36 y.o.   MRN: [id number]  HPI  [name] is a 36 year old female with a history of ovarian cysts who presents today with a chief complaint of abdominal pain/groin pain. Her pain is located to the right groin and right lateral lower abdomen. Her symptoms have been present for the past 1.5 months. Yesterday she began experiencing fevers, nausea, vomiting, and dyspareunia. She's been taking tylenol and iburpofen with some improvement. She denies vaiginal bleeding, discharge, urinary symptoms, diarrhea, constipation, bloody stools. LMP was 01/23/16.   Review of Systems  Constitutional: Positive for fever.  Gastrointestinal: Positive for abdominal pain, nausea and vomiting. Negative for constipation and diarrhea.  Genitourinary: Positive for dyspareunia and pelvic pain. Negative for dysuria, flank pain, hematuria, menstrual problem and vaginal discharge.       Past Medical History:  Diagnosis Date  . Tobacco abuse      Social History   Social History  . Marital status: Single    Spouse name: N/A  . Number of children: N/A  . Years of education: N/A   Occupational History  . Not on file.   Social History Main Topics  . Smoking status: Light Tobacco Smoker    Packs/day: 0.25    Years: 7.00  . Smokeless tobacco: Never Used     Comment: uses vapor cigarettes only  . Alcohol use No  . Drug use: No  . Sexual activity: Yes    Birth control/ protection: None   Other Topics Concern  . Not on file   Social History Narrative  . No narrative on file    Past Surgical History:  Procedure Laterality Date  . cystic ovarian mass  01/2005    Family History  Problem Relation Age of Onset  . Hypertension Father     No Known Allergies  Current Outpatient Prescriptions on File Prior to Visit  Medication Sig Dispense Refill  . aspirin 325 MG tablet Take 325 mg by mouth as needed.    . fluticasone  (FLONASE) 50 MCG/ACT nasal spray Place 2 sprays into both nostrils daily. 16 g 1   No current facility-administered medications on file prior to visit.     BP 120/82   Pulse 81   Temp 98.1 F (36.7 C) (Oral)   Ht 5\' 2"  (1.575 m)   Wt 147 lb 1.9 oz (66.7 kg)   LMP 01/23/2016   SpO2 98%   BMI 26.91 kg/m    Objective:   Physical Exam  Constitutional: She appears well-nourished. She does not have a sickly appearance. She does not appear ill.  Neck: Neck supple.  Cardiovascular: Normal rate and regular rhythm.   Pulmonary/Chest: Effort normal and breath sounds normal.  Abdominal: Soft. Normal appearance and bowel sounds are normal. There is tenderness in the right lower quadrant and suprapubic area. There is no rebound, no guarding and no CVA tenderness.  Skin: Skin is warm and dry.          Assessment & Plan:  Groin/Abdominal Pain:  Located to right side x 1.5 months, additional symptoms began yesterday. She does not appear acutely ill or uncomfortable. Exam with tenderness to right groin suggesting ovarian cyst rather than acute appendicitis. Vitals stable. CBC without evidence of leukocytosis. CMP unremarkable. Pelvic and transvaginal US ordered and pending to confirm. Again, unlikely acute appendicitis with the absence of leukocytosis, normal vitals, and stable exam. Discussed strict return precautions.  Sheral Flow, NP

## 2016-02-09 NOTE — Patient Instructions (Signed)
Complete lab work prior to leaving today. I will notify you of your results once received.   [facility] by the front desk [facility]. I will be in touch with you once I receive these results.  If your symptoms become worse over night then go to the hospital.  It was a pleasure meeting you!

## 2016-02-10 ENCOUNTER — Encounter: Payer: Self-pay | Admitting: Primary Care

## 2016-02-10 ENCOUNTER — Telehealth: Payer: Self-pay

## 2016-02-10 NOTE — Telephone Encounter (Signed)
Thank you Shirlee LimerickMarion!!!

## 2016-02-10 NOTE — Telephone Encounter (Signed)
Pt left v/m; pt was seen 02/09/16 and pt wants to know if she can get note to be out of work until after US on 02/14/16 because pt is still in pain. Pt request cb.

## 2016-02-10 NOTE — Telephone Encounter (Signed)
Yes, work noted printed and is ready for pickup. Placed in Chan's inbox. Shirlee LimerickMarion, is there anyway we can get her US moved up to tomorrow (Friday)?

## 2016-02-10 NOTE — Telephone Encounter (Signed)
Got the patients Shannon Dyer moved up to tomorrow at 1pm, patient notified and also notified to pick up work note.

## 2016-02-11 ENCOUNTER — Emergency Department (HOSPITAL_COMMUNITY)
Admission: EM | Admit: 2016-02-11 | Discharge: 2016-02-12 | Disposition: A | Discharge status: Home / Self Care | Payer: BLUE CROSS/BLUE SHIELD | Attending: Emergency Medicine | Admitting: Emergency Medicine

## 2016-02-11 ENCOUNTER — Ambulatory Visit
Admission: RE | Admit: 2016-02-11 | Discharge: 2016-02-11 | Disposition: A | Discharge status: Home / Self Care | Payer: BLUE CROSS/BLUE SHIELD | Source: Ambulatory Visit | Attending: Primary Care | Admitting: Primary Care

## 2016-02-11 ENCOUNTER — Encounter (HOSPITAL_COMMUNITY): Payer: Self-pay | Admitting: Emergency Medicine

## 2016-02-11 DIAGNOSIS — R1031 Right lower quadrant pain: Secondary | ICD-10-CM | POA: Insufficient documentation

## 2016-02-11 DIAGNOSIS — F17200 Nicotine dependence, unspecified, uncomplicated: Secondary | ICD-10-CM | POA: Diagnosis not present

## 2016-02-11 DIAGNOSIS — Z7982 Long term (current) use of aspirin: Secondary | ICD-10-CM | POA: Diagnosis not present

## 2016-02-11 DIAGNOSIS — R102 Pelvic and perineal pain: Secondary | ICD-10-CM | POA: Insufficient documentation

## 2016-02-11 LAB — URINALYSIS, ROUTINE W REFLEX MICROSCOPIC
BILIRUBIN URINE: NEGATIVE
Bacteria, UA: NONE SEEN
Glucose, UA: NEGATIVE mg/dL
HGB URINE DIPSTICK: NEGATIVE
Ketones, ur: NEGATIVE mg/dL
NITRITE: NEGATIVE
PH: 5 (ref 5.0–8.0)
Protein, ur: NEGATIVE mg/dL
SPECIFIC GRAVITY, URINE: 1.021 (ref 1.005–1.030)

## 2016-02-11 LAB — CBC
HCT: 43.5 % (ref 36.0–46.0)
Hemoglobin: 15.2 g/dL — ABNORMAL HIGH (ref 12.0–15.0)
MCH: 33 pg (ref 26.0–34.0)
MCHC: 34.9 g/dL (ref 30.0–36.0)
MCV: 94.4 fL (ref 78.0–100.0)
PLATELETS: 349 10*3/uL (ref 150–400)
RBC: 4.61 MIL/uL (ref 3.87–5.11)
RDW: 13.1 % (ref 11.5–15.5)
WBC: 9.1 10*3/uL (ref 4.0–10.5)

## 2016-02-11 LAB — BASIC METABOLIC PANEL
Anion gap: 6 (ref 5–15)
BUN: 10 mg/dL (ref 6–20)
CHLORIDE: 106 mmol/L (ref 101–111)
CO2: 25 mmol/L (ref 22–32)
CREATININE: 0.87 mg/dL (ref 0.44–1.00)
Calcium: 9.5 mg/dL (ref 8.9–10.3)
GFR calc Af Amer: >60 mL/min (ref >60)
GFR calc non Af Amer: >60 mL/min (ref >60)
GLUCOSE: 106 mg/dL — AB (ref 65–99)
Potassium: 3.7 mmol/L (ref 3.5–5.1)
Sodium: 137 mmol/L (ref 135–145)

## 2016-02-11 LAB — LIPASE, BLOOD: Lipase: 22 U/L (ref 11–51)

## 2016-02-11 LAB — I-STAT BETA HCG BLOOD, ED (MC, WL, AP ONLY): I-stat hCG, quantitative: <5 m[IU]/mL (ref <5)

## 2016-02-11 MED ORDER — ONDANSETRON 4 MG PO TBDP
ORAL_TABLET | ORAL | Status: AC
  Filled 2016-02-11: qty 1

## 2016-02-11 MED ORDER — OXYCODONE-ACETAMINOPHEN 5-325 MG PO TABS
ORAL_TABLET | ORAL | Status: AC
  Administered 2016-02-11: 1 via ORAL
  Filled 2016-02-11: qty 1

## 2016-02-11 MED ORDER — ONDANSETRON 4 MG PO TBDP
4.0000 mg | ORAL_TABLET | Freq: Once | ORAL | Status: AC
  Administered 2016-02-11: 4 mg via ORAL

## 2016-02-11 MED ORDER — OXYCODONE-ACETAMINOPHEN 5-325 MG PO TABS
1.0000 | ORAL_TABLET | Freq: Once | ORAL | Status: AC
  Administered 2016-02-11: 1 via ORAL

## 2016-02-11 MED ORDER — IBUPROFEN 800 MG PO TABS
800.0000 mg | ORAL_TABLET | Freq: Once | ORAL | Status: AC
  Administered 2016-02-12: 800 mg via ORAL
  Filled 2016-02-11: qty 1

## 2016-02-11 MED ORDER — ACETAMINOPHEN 325 MG PO TABS
650.0000 mg | ORAL_TABLET | Freq: Once | ORAL | Status: AC
  Administered 2016-02-12: 650 mg via ORAL
  Filled 2016-02-11: qty 2

## 2016-02-11 MED ORDER — OXYCODONE HCL 5 MG PO TABS
5.0000 mg | ORAL_TABLET | Freq: Once | ORAL | Status: AC
  Administered 2016-02-12: 5 mg via ORAL
  Filled 2016-02-11: qty 1

## 2016-02-11 NOTE — ED Provider Notes (Addendum)
[facility]-EMERGENCY DEPT Provider Note   CSN: [id number] Arrival date & time: 02/11/16  1713  By signing my name below, I, Soijett Blue, attest that this documentation has been prepared under the direction and in the presence of Melene Plan, DO. Electronically Signed: Soijett Blue, ED Scribe. 02/11/16. 11:41 PM.  History   Chief Complaint Chief Complaint  Patient presents with  . Flank Pain  . Abdominal Pain    HPI Shannon Dyer is a 35 y.o. female who presents to the Emergency Department complaining of lower abdominal pain onset 1.5 months ago worsening 3 days ago. Pt lower abdominal pain is worsened with palpation and ambulation. Pt notes that she had an [place] completed today with negative results at her PCP office, but denies having a pelvic exam completed. She is having associated symptoms of dysuria and "feeling something shift during intercourse". She has not tried any medications for the relief of her symptoms. She denies constipation, fever, chills, and any other symptoms.     The history is provided by the patient. No language interpreter was used.  Abdominal Pain   This is a recurrent problem. Episode onset: 1.5 months ago. The problem occurs every several days. The problem has not changed since onset.The pain is located in the LLQ and RLQ. The pain is moderate. Associated symptoms include dysuria. Pertinent negatives include fever, nausea, vomiting, constipation, headaches, arthralgias and myalgias. The symptoms are aggravated by palpation. Nothing relieves the symptoms.    Past Medical History:  Diagnosis Date  . Tobacco abuse     Patient Active Problem List   Diagnosis Date Noted  . ETD (eustachian tube dysfunction) 08/11/2015  . Elbow pain 08/11/2015  . Hypokalemia 12/04/2012  . Pyelonephritis 11/21/2012  . UTI (urinary tract infection) 11/21/2012  . Leukocytosis, unspecified 11/21/2012  . Myalgia 11/21/2012  . Fever, unspecified 11/21/2012  . Acute bronchitis/  probable 11/21/2012  . Diarrhea 11/21/2012  . Irregular menses 05/27/2012  . Encounter for routine gynecological examination 05/22/2012  . Amenorrhea 05/22/2012  . 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 BACK PAIN 02/16/2010  . ANXIETY DEPRESSION 06/30/2008  . GANGLION CYST, WRIST, LEFT 07/25/2007    Past Surgical History:  Procedure Laterality Date  . cystic ovarian mass  01/2005    OB History    Gravida Para Term Preterm AB Living   0             SAB TAB Ectopic Multiple Live Births                   Home Medications    Prior to Admission medications   Medication Sig Start Date End Date Taking? Authorizing Provider  aspirin 325 MG tablet Take 325 mg by mouth as needed.    Historical Provider, MD  fluticasone (FLONASE) 50 MCG/ACT nasal spray Place 2 sprays into both nostrils daily. 08/11/15   Joaquim Nam, MD  gabapentin (NEURONTIN) 300 MG capsule Take 300 mg by mouth 2 (two) times daily.    Historical Provider, MD    Family History Family History  Problem Relation Age of Onset  . Hypertension Father     Social History Social History  Substance Use Topics  . Smoking status: Light Tobacco Smoker    Packs/day: 0.25    Years: 7.00  . Smokeless tobacco: Never Used     Comment: uses vapor cigarettes only  . Alcohol use No     Allergies   Patient has no known allergies.   Review of Systems  Review of Systems  Constitutional: Negative for chills and fever.  HENT: Negative for congestion and rhinorrhea.   Eyes: Negative for redness and visual disturbance.  Respiratory: Negative for shortness of breath and wheezing.   Cardiovascular: Negative for chest pain and palpitations.  Gastrointestinal: Positive for abdominal pain. Negative for constipation, nausea and vomiting.  Genitourinary: Positive for dysuria. Negative for urgency.  Musculoskeletal: Negative for arthralgias and myalgias.  Skin: Negative for pallor and wound.  Neurological: Negative for dizziness  and headaches.     Physical Exam Updated Vital Signs BP 108/79 (BP Location: Right Arm)   Pulse 82   Temp 97.6 F (36.4 C) (Oral)   Resp 18   Ht 5\' 2"  (1.575 m)   Wt 147 lb (66.7 kg)   LMP 01/23/2016   SpO2 100%   BMI 26.89 kg/m   Physical Exam  Constitutional: She is oriented to person, place, and time. She appears well-developed and well-nourished. No distress.  HENT:  Head: Normocephalic and atraumatic.  Eyes: EOM are normal. Pupils are equal, round, and reactive to light.  Neck: Normal range of motion. Neck supple.  Cardiovascular: Normal rate and regular rhythm.  Exam reveals no gallop and no friction rub.   No murmur heard. Pulmonary/Chest: Effort normal. She has no wheezes. She has no rales.  Abdominal: Soft. She exhibits no distension. There is tenderness.  TTP worse just above right side of pubic symphysis. No pain of internal or external rotation of RLE. No masses.  Musculoskeletal: She exhibits no edema or tenderness.  Neurological: She is alert and oriented to person, place, and time.  Skin: Skin is warm and dry. She is not diaphoretic.  Psychiatric: She has a normal mood and affect. Her behavior is normal.  Nursing note and vitals reviewed.   ED Treatments / Results  DIAGNOSTIC STUDIES: Oxygen Saturation is 100% on RA, nl by my interpretation.    COORDINATION OF CARE: 11:31 PM Discussed treatment plan with pt at bedside which includes labs, pelvic exam, RPR, HIV antibody, and pt agreed to plan.   Labs (all labs ordered are listed, but only abnormal results are displayed) Labs Reviewed  URINALYSIS, ROUTINE W REFLEX MICROSCOPIC - Abnormal; Notable for the following:       Result Value   APPearance HAZY (*)    Leukocytes, UA TRACE (*)    Squamous Epithelial / LPF 0-5 (*)    Crystals PRESENT (*)    All other components within normal limits  BASIC METABOLIC PANEL - Abnormal; Notable for the following:    Glucose, Bld 106 (*)    All other components  within normal limits  CBC - Abnormal; Notable for the following:    Hemoglobin 15.2 (*)    All other components within normal limits  WET PREP, GENITAL  LIPASE, BLOOD  RPR  HIV ANTIBODY (ROUTINE TESTING)  I-STAT BETA HCG BLOOD, ED ([facility], [facility], [facility] ONLY)  GC/CHLAMYDIA PROBE AMP ([place]) NOT AT [facility]    Radiology US Transvaginal Non-ob  Result Date: 02/11/2016 CLINICAL DATA:  Right pelvic and groin pain for the past week. EXAM: TRANSABDOMINAL AND TRANSVAGINAL ULTRASOUND OF PELVIS TECHNIQUE: Both transabdominal and transvaginal ultrasound examinations of the pelvis were performed. Transabdominal technique was performed for global imaging of the pelvis including uterus, ovaries, adnexal regions, and pelvic cul-de-sac. It was necessary to proceed with endovaginal exam following the transabdominal exam to visualize the ovaries. COMPARISON:  None FINDINGS: Uterus Measurements: 7.6 x 4.2 x 3.3 cm. No fibroids or other  mass visualized. Endometrium Thickness: 7.6 mm.  No focal abnormality visualized. Right ovary Measurements: 3.6 x 3.6 x 2.4 cm. Normal appearance/no adnexal mass. Left ovary Measurements: 2.0 x 1.3 x 0.9 cm. Normal appearance/no adnexal mass. Other findings No abnormal free fluid. IMPRESSION: Normal examination. Electronically Signed   By: Beckie SaltsSteven  Reid M.D.   On: 02/11/2016 13:36   [place] Pelvis Complete  Result Date: 02/11/2016 CLINICAL DATA:  Right pelvic and groin pain for the past week. EXAM: TRANSABDOMINAL AND TRANSVAGINAL ULTRASOUND OF PELVIS TECHNIQUE: Both transabdominal and transvaginal ultrasound examinations of the pelvis were performed. Transabdominal technique was performed for global imaging of the pelvis including uterus, ovaries, adnexal regions, and pelvic cul-de-sac. It was necessary to proceed with endovaginal exam following the transabdominal exam to visualize the ovaries. COMPARISON:  None FINDINGS: Uterus Measurements: 7.6 x 4.2 x 3.3 cm. No fibroids or other mass  visualized. Endometrium Thickness: 7.6 mm.  No focal abnormality visualized. Right ovary Measurements: 3.6 x 3.6 x 2.4 cm. Normal appearance/no adnexal mass. Left ovary Measurements: 2.0 x 1.3 x 0.9 cm. Normal appearance/no adnexal mass. Other findings No abnormal free fluid. IMPRESSION: Normal examination. Electronically Signed   By: Beckie SaltsSteven  Reid M.D.   On: 02/11/2016 13:36    Procedures Procedures (including critical care time)  Medications Ordered in ED Medications  ondansetron (ZOFRAN-ODT) disintegrating tablet 4 mg (4 mg Oral Given 02/11/16 1740)  oxyCODONE-acetaminophen (PERCOCET/ROXICET) 5-325 MG per tablet 1 tablet (1 tablet Oral Given 02/11/16 1739)  acetaminophen (TYLENOL) tablet 650 mg (650 mg Oral Given 02/12/16 0046)  ibuprofen (ADVIL,MOTRIN) tablet 800 mg (800 mg Oral Given 02/12/16 0046)  oxyCODONE (Oxy IR/ROXICODONE) immediate release tablet 5 mg (5 mg Oral Given 02/12/16 0046)     Initial Impression / Assessment and Plan / ED Course  I have reviewed the triage vital signs and the nursing notes.  Pertinent labs & imaging results that were available during my care of the patient were reviewed by me and considered in my medical decision making (see chart for details).  Clinical Course     36 yo F With a chief complaint of right-sided pelvic pain. This been going on for about a month and a half. Worsening over the past week. Denies fevers chills vomiting diarrhea. On my exam the pain appears to be localized to the pelvis. Patient is declining pelvic exam. Most of her complaints are worse with intercourse. He does not appear to affect the hip joint. I do not palpate a mass and do not suspect a femoral hernia. As I am unable to perform a pelvic exam I am unable to further diagnose this. Suggested she follow-up with her GYN.  1:44 AM:  I have discussed the diagnosis/risks/treatment options with the patient and family and believe the pt to be eligible for discharge home to follow-up  with GYN. We also discussed returning to the ED immediately if new or worsening sx occur. We discussed the sx which are most concerning (e.g., sudden worsening pain, fever, inability to tolerate by mouth) that necessitate immediate return. Medications administered to the patient during their visit and any new prescriptions provided to the patient are listed below.  Medications given during this visit Medications  ondansetron (ZOFRAN-ODT) disintegrating tablet 4 mg (4 mg Oral Given 02/11/16 1740)  oxyCODONE-acetaminophen (PERCOCET/ROXICET) 5-325 MG per tablet 1 tablet (1 tablet Oral Given 02/11/16 1739)  acetaminophen (TYLENOL) tablet 650 mg (650 mg Oral Given 02/12/16 0046)  ibuprofen (ADVIL,MOTRIN) tablet 800 mg (800 mg Oral Given 02/12/16 0046)  oxyCODONE (Oxy IR/ROXICODONE) immediate release tablet 5 mg (5 mg Oral Given 02/12/16 0046)     The patient appears reasonably screen and/or stabilized for discharge and I doubt any other medical condition or other [facility] requiring further screening, evaluation, or treatment in the ED at this time prior to discharge.    Final Clinical Impressions(s) / ED Diagnoses   Final diagnoses:  Pelvic pain    New Prescriptions New Prescriptions   No medications on file   I personally performed the services described in this documentation, which was scribed in my presence. The recorded information has been reviewed and is accurate.      Melene Plan, DO 02/12/16 0144    Melene Plan, DO 02/12/16 1610

## 2016-02-11 NOTE — ED Triage Notes (Signed)
Pt presents to ED for assessment of right sided flank and lower abdominal pain, starting approx 2 weeks ago.  Pain has migrated from right flank to lower groin area.  Pt seen at PCP at had ultrasound today to look at ovaries with no results.  Pt c/o increased urination and increased abdominal pain with urination.

## 2016-02-12 NOTE — ED Notes (Signed)
[facility]. Pt wants Pelvic Exam done by ObGyn Dr.

## 2016-02-14 ENCOUNTER — Ambulatory Visit (INDEPENDENT_AMBULATORY_CARE_PROVIDER_SITE_OTHER)
Admission: RE | Admit: 2016-02-14 | Discharge: 2016-02-14 | Disposition: A | Discharge status: Home / Self Care | Payer: BLUE CROSS/BLUE SHIELD | Source: Ambulatory Visit | Attending: Family Medicine | Admitting: Family Medicine

## 2016-02-14 ENCOUNTER — Other Ambulatory Visit (HOSPITAL_COMMUNITY)
Admission: RE | Admit: 2016-02-14 | Discharge: 2016-02-14 | Disposition: A | Discharge status: Home / Self Care | Payer: BLUE CROSS/BLUE SHIELD | Source: Ambulatory Visit | Attending: Family Medicine | Admitting: Family Medicine

## 2016-02-14 ENCOUNTER — Ambulatory Visit: Payer: BLUE CROSS/BLUE SHIELD

## 2016-02-14 ENCOUNTER — Encounter: Payer: Self-pay | Admitting: Family Medicine

## 2016-02-14 ENCOUNTER — Ambulatory Visit (INDEPENDENT_AMBULATORY_CARE_PROVIDER_SITE_OTHER): Payer: BLUE CROSS/BLUE SHIELD | Admitting: Family Medicine

## 2016-02-14 VITALS — BP 104/62 | HR 90 | Temp 98.4°F | Ht 62.0 in | Wt 151.2 lb

## 2016-02-14 DIAGNOSIS — Z01411 Encounter for gynecological examination (general) (routine) with abnormal findings: Secondary | ICD-10-CM | POA: Diagnosis not present

## 2016-02-14 DIAGNOSIS — Z01419 Encounter for gynecological examination (general) (routine) without abnormal findings: Secondary | ICD-10-CM | POA: Diagnosis present

## 2016-02-14 DIAGNOSIS — Z8619 Personal history of other infectious and parasitic diseases: Secondary | ICD-10-CM | POA: Insufficient documentation

## 2016-02-14 DIAGNOSIS — R1013 Epigastric pain: Secondary | ICD-10-CM

## 2016-02-14 DIAGNOSIS — R112 Nausea with vomiting, unspecified: Secondary | ICD-10-CM | POA: Insufficient documentation

## 2016-02-14 DIAGNOSIS — Z113 Encounter for screening for infections with a predominantly sexual mode of transmission: Secondary | ICD-10-CM | POA: Diagnosis present

## 2016-02-14 DIAGNOSIS — Z1151 Encounter for screening for human papillomavirus (HPV): Secondary | ICD-10-CM | POA: Insufficient documentation

## 2016-02-14 DIAGNOSIS — R102 Pelvic and perineal pain: Secondary | ICD-10-CM

## 2016-02-14 MED ORDER — PROMETHAZINE HCL 25 MG PO TABS: 25.0000 mg | ORAL_TABLET | Freq: Three times a day (TID) | ORAL | 0 refills | Status: DC | PRN

## 2016-02-14 NOTE — Assessment & Plan Note (Signed)
Pap done today with HPV reflex

## 2016-02-14 NOTE — Assessment & Plan Note (Signed)
In the setting of R pelvic pain with nl US  She is mildly tender internally on R -no mass seen  No obv hernia either Added gc/chlamyd screen to her pap Pending results  Has gyn appt 1/25

## 2016-02-14 NOTE — Assessment & Plan Note (Signed)
Suspect due to nsaids for pelvic pain-inst to stop them and take tylenol instead  inst to try zantac otc 150 bid for at least 2 wk Update if no imp  Phenergan for nausea as well (? If nausea is rel to this or pain)

## 2016-02-14 NOTE — Progress Notes (Signed)
Subjective:    Patient ID: Shannon Dyer, female    DOB: 02/14/1980, 36 y.o.   MRN: [id number]  HPI Here for n/v   She was seen recently for pelvic pain in assoc with this  Her [place] 1/12 pelvic was normal   Admission on 02/11/2016, Discharged on 02/12/2016  Component Date Value Ref Range Status  . Color, Urine 02/11/2016 YELLOW  YELLOW Final  . APPearance 02/11/2016 HAZY* CLEAR Final  . Specific Gravity, Urine 02/11/2016 1.021  1.005 - 1.030 Final  . pH 02/11/2016 5.0  5.0 - 8.0 Final  . Glucose, UA 02/11/2016 NEGATIVE  NEGATIVE mg/dL Final  . Hgb urine dipstick 02/11/2016 NEGATIVE  NEGATIVE Final  . Bilirubin Urine 02/11/2016 NEGATIVE  NEGATIVE Final  . Ketones, ur 02/11/2016 NEGATIVE  NEGATIVE mg/dL Final  . Protein, ur 02/11/2016 NEGATIVE  NEGATIVE mg/dL Final  . Nitrite 02/11/2016 NEGATIVE  NEGATIVE Final  . Leukocytes, UA 02/11/2016 TRACE* NEGATIVE Final  . RBC / HPF 02/11/2016 0-5  0 - 5 RBC/hpf Final  . WBC, UA 02/11/2016 0-5  0 - 5 WBC/hpf Final  . Bacteria, UA 02/11/2016 NONE SEEN  NONE SEEN Final  . Squamous Epithelial / LPF 02/11/2016 0-5* NONE SEEN Final  . Mucous 02/11/2016 PRESENT   Final  . Ca Oxalate Crys, UA 02/11/2016 PRESENT   Final  . Crystals 02/11/2016 PRESENT* NEGATIVE Final  . Sodium 02/11/2016 137  135 - 145 mmol/L Final  . Potassium 02/11/2016 3.7  3.5 - 5.1 mmol/L Final  . Chloride 02/11/2016 106  101 - 111 mmol/L Final  . CO2 02/11/2016 25  22 - 32 mmol/L Final  . Glucose, Bld 02/11/2016 106* 65 - 99 mg/dL Final  . BUN 02/11/2016 10  6 - 20 mg/dL Final  . Creatinine, Ser 02/11/2016 0.87  0.44 - 1.00 mg/dL Final  . Calcium 02/11/2016 9.5  8.9 - 10.3 mg/dL Final  . GFR calc non Af Amer 02/11/2016 >60  >60 mL/min Final  . GFR calc Af Amer 02/11/2016 >60  >60 mL/min Final   Comment: (NOTE) The eGFR has been calculated using the CKD EPI equation. This calculation has not been validated in all clinical situations. eGFR's persistently <60 mL/min  signify possible Chronic Kidney Disease.   . Anion gap 02/11/2016 6  5 - 15 Final  . WBC 02/11/2016 9.1  4.0 - 10.5 K/uL Final  . RBC 02/11/2016 4.61  3.87 - 5.11 MIL/uL Final  . Hemoglobin 02/11/2016 15.2* 12.0 - 15.0 g/dL Final  . HCT 02/11/2016 43.5  36.0 - 46.0 % Final  . MCV 02/11/2016 94.4  78.0 - 100.0 fL Final  . MCH 02/11/2016 33.0  26.0 - 34.0 pg Final  . MCHC 02/11/2016 34.9  30.0 - 36.0 g/dL Final  . RDW 02/11/2016 13.1  11.5 - 15.5 % Final  . Platelets 02/11/2016 349  150 - 400 K/uL Final  . I-stat hCG, quantitative 02/11/2016 <5.0  <5 mIU/mL Final  . Comment 3 02/11/2016          Final   Comment:   GEST. AGE      CONC.  (mIU/mL)   <=1 WEEK        5 - 50     2 WEEKS       50 - 500     3 WEEKS       100 - 10,000     4 WEEKS     1,000 - 30,000  FEMALE AND NON-PREGNANT FEMALE:     LESS THAN 5 mIU/mL   . Lipase 02/11/2016 22  11 - 51 U/L Final    Labs were also re assuring   Still vomiting - few times (only at night) -- tues/sat /sunday  No diarrhea  No fever - was low grade in ED only   Taking ibuprofen  Does eat when she takes it  Also some upper abdominal pain     She went to the ED - on Friday night   [facility]    Hurting on R side - very low  Hx of ovarian cysts in the past  Last pap 2014  Periods are regular  Last menses was more crampy than normal Not very heavy  Last about 3 days or so   Is trying to get pregnant -her preg test was negative    Patient Active Problem List   Diagnosis Date Noted  . Pelvic pain in female 02/14/2016  . Nausea with vomiting 02/14/2016  . 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 Dyspepsia 02/14/2016  . Myalgia 11/21/2012  . Encounter for routine gynecological examination 05/22/2012  . 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 BACK PAIN 02/16/2010  . ANXIETY DEPRESSION 06/30/2008  . GANGLION CYST, WRIST, LEFT 07/25/2007   Past Medical History:  Diagnosis Date  . Tobacco abuse      Past Surgical History:  Procedure Laterality Date  . cystic ovarian mass  01/2005   Social History  Substance Use Topics  . Smoking status: Light Tobacco Smoker    Packs/day: 0.25    Years: 7.00  . Smokeless tobacco: Never Used     Comment: uses vapor cigarettes only  . Alcohol use No   Family History  Problem Relation Age of Onset  . Hypertension Father    No Known Allergies Current Outpatient Prescriptions on File Prior to Visit  Medication Sig Dispense Refill  . gabapentin (NEURONTIN) 300 MG capsule Take 300 mg by mouth 2 (two) times daily.     No current facility-administered medications on file prior to visit.     Review of Systems Review of Systems  Constitutional: Negative for fever, appetite change,  and unexpected weight change.  Eyes: Negative for pain and visual disturbance.  Respiratory: Negative for cough and shortness of breath.   Cardiovascular: Negative for cp or palpitations    Gastrointestinal: Negative for  diarrhea and constipation. neg for blood in stool or dark stool, pos for n/v and R sided low abd/pelvic pain and some epigastric discomfort  Genitourinary: Negative for urgency and frequency.  Skin: Negative for pallor or rash   Neurological: Negative for weakness, light-headedness, numbness and headaches.  Hematological: Negative for adenopathy. Does not bruise/bleed easily.  Psychiatric/Behavioral: Negative for dysphoric mood. The patient is not nervous/anxious.         Objective:   Physical Exam  Constitutional: She appears well-developed and well-nourished. No distress.  Fatigued appearing   HENT:  Head: Normocephalic and atraumatic.  Mouth/Throat: Oropharynx is clear and moist.  Eyes: Conjunctivae and EOM are normal. Pupils are equal, round, and reactive to light.  Neck: Normal range of motion. Neck supple. No JVD present. Carotid bruit is not present. No thyromegaly present.  Cardiovascular: Normal rate, regular rhythm, normal heart  sounds and intact distal pulses.  Exam reveals no gallop.   Pulmonary/Chest: Effort normal and breath sounds normal. No respiratory distress. She has no wheezes. She has no rales.  No  crackles  Abdominal: Soft. Bowel sounds are normal. She exhibits no distension, no abdominal bruit and no mass. There is no hepatosplenomegaly. There is tenderness in the right lower quadrant and epigastric area. There is no rebound, no guarding and no CVA tenderness.  Epigastric pain is mild No RUQ pain or murphy's sign  Genitourinary:  Genitourinary Comments: Breast exam: No mass, nodules, thickening, tenderness, bulging, retraction, inflamation, nipple discharge or skin changes noted.  No axillary or clavicular LA.             Anus appears normal w/o hemorrhoids or masses     External genitalia : nl appearance and hair distribution/no lesions     Urethral meatus : nl size, no lesions or prolapse     Urethra: no masses, tenderness or scarring    Bladder : no masses or tenderness     Vagina: nl general appearance, no discharge or  Lesions, no significant cystocele  or rectocele     Cervix: no lesions/ discharge or friability no CMT    Uterus: nl size, contour, position, and mobility (not fixed) , non tender    Adnexa : no masses, , enlargement or nodularity some mild tenderness on R w/o mass noted         Musculoskeletal: She exhibits no edema.  Lymphadenopathy:    She has no cervical adenopathy.  Neurological: She is alert. She has normal reflexes.  Skin: Skin is warm and dry. No rash noted. No pallor.  Psychiatric: She has a normal mood and affect.          Assessment & Plan:   Problem List Items Addressed This Visit      Digestive   Nausea with vomiting    This happens primarily at night in pt with R low abd/pelvic pain and also dyspepsia  Could be related to either  CT of abd /pelvis ordered  Phenergan px with warning of sedation  Work note given to return Friday  if improved as well for this and pain      Relevant Orders   CT Abdomen Pelvis W Contrast     Other   Dyspepsia    Suspect due to nsaids for pelvic pain-inst to stop them and take tylenol instead  inst to try zantac otc 150 bid for at least 2 wk Update if no imp  Phenergan for nausea as well (? If nausea is rel to this or pain)      Encounter for routine gynecological examination    In the setting of R pelvic pain with nl [place]  She is mildly tender internally on R -no mass seen  No obv hernia either Added gc/chlamyd screen to her pap Pending results  Has gyn appt 1/25      Relevant Orders   Cytology - PAP   History of HPV infection    Pap done today with HPV reflex      Pelvic pain in female - Primary    R sided pelvic and low abdominal pain  This is ongoing and worsening  Lab and [place] were reassuring and she has f/u 1/25 with gyn  N/v also continue which is worrisome  I do want to fully r/o other non gyn causes such as appendicitis - CT with contrast ordered for abd/pel      Relevant Orders   CT Abdomen Pelvis W Contrast

## 2016-02-14 NOTE — Patient Instructions (Addendum)
[facility]  Use warm compresses on the painful area  Try phenergan for nausea  Also zantac over the counter 150 mg -and take it twice daily for stomach pain and hold the ibuprofen  Tylenol for discomfort instead of ibuprofen   Stop at check out to refer for a CT scan

## 2016-02-14 NOTE — Progress Notes (Signed)
Pre visit review using our clinic review tool, if applicable. No additional management support is needed unless otherwise documented below in the visit note. 

## 2016-02-14 NOTE — Assessment & Plan Note (Signed)
This happens primarily at night in pt with R low abd/pelvic pain and also dyspepsia  Could be related to either  CT of abd /pelvis ordered  Phenergan px with warning of sedation  Work note given to return Friday if improved as well for this and pain

## 2016-02-14 NOTE — Assessment & Plan Note (Signed)
R sided pelvic and low abdominal pain  This is ongoing and worsening  Lab and US were reassuring and she has f/u 1/25 with gyn  N/v also continue which is worrisome  I do want to fully r/o other non gyn causes such as appendicitis - CT with contrast ordered for abd/pel

## 2016-02-17 LAB — CYTOLOGY - PAP
CHLAMYDIA, DNA PROBE: NEGATIVE
Diagnosis: NEGATIVE
HPV (WINDOPATH): NOT DETECTED
Neisseria Gonorrhea: NEGATIVE

## 2016-02-21 ENCOUNTER — Telehealth: Payer: Self-pay | Admitting: *Deleted

## 2016-02-21 NOTE — Telephone Encounter (Signed)
Pt wanted me to send DR. Tower a message. Pt said that she is still in pain and it hurts to ride in a car so she hasn't been back to work, pt said her last doctor's note said she could return on 02/18/16 if she is feeling better. Well she wasn't any better so she is requesting that Dr. Milinda Antisower write a work note for her to be out from 02/18/16 through Thursday 02/24/16 because she has her f/u with her GYN on that date, pt request note be faxed to her job at [phone]

## 2016-02-21 NOTE — Telephone Encounter (Signed)
Letter faxed and pt notified.

## 2016-02-21 NOTE — Telephone Encounter (Signed)
Note is done in IN box  

## 2016-02-24 ENCOUNTER — Encounter: Payer: Self-pay | Admitting: Family Medicine

## 2016-02-24 ENCOUNTER — Encounter: Payer: Self-pay | Admitting: *Deleted

## 2016-02-24 ENCOUNTER — Ambulatory Visit (INDEPENDENT_AMBULATORY_CARE_PROVIDER_SITE_OTHER): Payer: BLUE CROSS/BLUE SHIELD | Admitting: Family Medicine

## 2016-02-24 DIAGNOSIS — R102 Pelvic and perineal pain: Secondary | ICD-10-CM

## 2016-02-24 DIAGNOSIS — N979 Female infertility, unspecified: Secondary | ICD-10-CM

## 2016-02-24 NOTE — Assessment & Plan Note (Signed)
Discussed issue and work-up and she will f/u for this.

## 2016-02-24 NOTE — Patient Instructions (Signed)
Pelvic Pain, Female Pelvic pain is pain felt below the belly button and between your hips. It can be caused by many different things. It is important to get help right away. This is especially true for severe, sharp, or unusual pain that comes on suddenly.  HOME CARE  Only take medicine as told by your doctor.  Rest as told by your doctor.  Eat a healthy diet, such as fruits, vegetables, and lean meats.  Drink enough fluids to keep your pee (urine) clear or pale yellow, or as told.  Avoid sex (intercourse) if it causes pain.  Apply warm or cold packs to your lower belly (abdomen). Use the type of pack that helps the pain.  Avoid situations that cause you stress.  Keep a journal to track your pain. Write down:  When the pain started.  Where it is located.  If there are things that seem to be related to the pain, such as food or your period.  Follow up with your doctor as told. GET HELP RIGHT AWAY IF:   You have heavy bleeding from the vagina.  You have more pelvic pain.  You feel lightheaded or pass out (faint).  You have chills.  You have pain when you pee or have blood in your pee.  You cannot stop having watery poop (diarrhea).  You cannot stop throwing up (vomiting).  You have a fever or lasting symptoms for more than 3 days.  You have a fever and your symptoms suddenly get worse.  You are being physically or sexually abused.  Your medicine does not help your pain.  You have fluid (discharge) coming from your vagina that is not normal. MAKE SURE YOU:  Understand these instructions.  Will watch your condition.  Will get help if you are not doing well or get worse. This information is not intended to replace advice given to you by your health care provider. Make sure you discuss any questions you have with your health care provider. Document Released: 07/05/2007 Document Revised: 02/06/2014 Document Reviewed: 11/06/2014 Elsevier Interactive Patient  Education  2017 Elsevier Inc.  

## 2016-02-24 NOTE — Progress Notes (Signed)
   Subjective:    Patient ID: Shannon Dyer is a 36 y.o. female presenting with Pelvic Pain  on 02/24/2016  HPI: One month h/o sharp pain. Nothing has made it better or worse. Notes pain with intercourse. Initially had pain that came and went (every other to every 3rd day) but now it stays. Was lasting 2-3 hours, but now it is constant and she cannot walk. Has missed last 3 wks of work. Taking ibuprofen or tylenol. Notes no constipation, no diarrhea, no blood in stool. No painful urination. Pain with BM. Pain worsened 3 wks ago and has stayed the same since that time. Has h/o left ovarian cyst. Initially having fever, but has not had any since. Initially, had vomiting as well, but none recently. Had LMP 02/21/16. Has been trying to have a baby x 2 years. Denies painful periods.  Review of Systems  Constitutional: Negative for chills and fever.  Respiratory: Negative for shortness of breath.   Cardiovascular: Negative for chest pain.  Gastrointestinal: Negative for abdominal pain, nausea and vomiting.  Genitourinary: Negative for dysuria.  Skin: Negative for rash.      Objective:    BP 113/80   Pulse (!) 103   Wt 149 lb 12.8 oz (67.9 kg)   LMP 02/21/2016 (Exact Date)   BMI 27.40 kg/m  Physical Exam  Constitutional: She is oriented to person, place, and time. She appears well-developed and well-nourished. No distress.  HENT:  Head: Normocephalic and atraumatic.  Eyes: No scleral icterus.  Neck: Neck supple.  Cardiovascular: Normal rate.   Pulmonary/Chest: Effort normal.  Abdominal: Soft. There is no tenderness. There is no rebound and no guarding.  Genitourinary:  Genitourinary Comments: BUS normal, vagina is pink and rugated, cervix is nulliparous without lesion, uterus is small and anteverted, no left adnexal mass or tenderness, right adnexa without mass and with minimal tenderness   Neurological: She is alert and oriented to person, place, and time.  Skin: Skin is warm and  dry.  Psychiatric: She has a normal mood and affect.        Assessment & Plan:   Problem List Items Addressed This Visit      Unprioritized   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 problem    Discussed issue and work-up and she will f/u for this.      Pelvic pain in female    Likely ruptured ovarian cyst--feels like pain is similar, no GI symptoms, neg. Pelvic cultures, negative laparoscopy. Suspect this will continue to get better. Use Ibuprofen and tylenol.          Total face-to-face time with patient: 30 minutes. Over 50% of encounter was spent on counseling and coordination of care. Return in about 3 months (around 05/24/2016) for a follow-up.  Shannon Dyer 02/24/2016 2:49 PM

## 2016-02-24 NOTE — Assessment & Plan Note (Signed)
Likely ruptured ovarian cyst--feels like pain is similar, no GI symptoms, neg. Pelvic cultures, negative laparoscopy. Suspect this will continue to get better. Use Ibuprofen and tylenol.

## 2016-02-28 ENCOUNTER — Telehealth: Payer: Self-pay | Admitting: General Practice

## 2016-02-28 NOTE — Telephone Encounter (Signed)
Patient called and left message with [facility] office that she had some pain/discomfort over the weekend and is feeling slightly better today. Discussed with patient that the pain from the cyst could continue over the next week or so but after that she should see some improvement. Recommended she continue to use ibuprofen/tylenol as needed. Patient verbalized understanding & states she is written out of work until tomorrow and wants to know if it is okay for her to work. Told patient she can certainly work but only she knows if she is up for it. Patient verbalized understanding & had no questions

## 2016-03-07 ENCOUNTER — Telehealth: Payer: Self-pay | Admitting: *Deleted

## 2016-03-07 DIAGNOSIS — N940 Mittelschmerz: Secondary | ICD-10-CM

## 2016-03-07 DIAGNOSIS — R102 Pelvic and perineal pain: Secondary | ICD-10-CM

## 2016-03-07 MED ORDER — DICLOFENAC SODIUM 75 MG PO TBEC: 75.0000 mg | DELAYED_RELEASE_TABLET | Freq: Two times a day (BID) | ORAL | 0 refills | Status: DC

## 2016-03-07 NOTE — Telephone Encounter (Signed)
-----   Message from Lindell SparHeather L Bacon, [place] sent at 03/07/2016  3:53 PM EST ----- Contact: [phone] Wants to have Rn call her back, severe pelvic pain and discomfort,  Was seen by Dr Shawnie PonsPratt in January and was told to call back if symptoms return.

## 2016-03-08 NOTE — Telephone Encounter (Signed)
Pt called the office stating she is experiencing some pain on her right side, was seen a few weeks ago for pelvic pain and had normal US.  Her LMP was 02-21-16.  Informed pt that she could be experiencing ovulatory pain and sent Diclofenac to the pharmacy and encouraged to apply heat to the area.  Instructed to call back if pain continues or intensifies over the next few days.

## 2016-03-21 ENCOUNTER — Telehealth: Payer: Self-pay | Admitting: Radiology

## 2016-03-21 NOTE — Telephone Encounter (Signed)
left message on the cell phone voicemail to call [facility] to schedule 3 month gyn f/u with Dr Shawnie PonsPratt, Approx 05/24/16

## 2016-05-23 ENCOUNTER — Ambulatory Visit: Payer: BLUE CROSS/BLUE SHIELD | Admitting: Family Medicine

## 2016-06-06 ENCOUNTER — Encounter: Payer: Self-pay | Admitting: Family Medicine

## 2016-06-06 ENCOUNTER — Ambulatory Visit (INDEPENDENT_AMBULATORY_CARE_PROVIDER_SITE_OTHER): Payer: BLUE CROSS/BLUE SHIELD | Admitting: Family Medicine

## 2016-06-06 VITALS — BP 117/83 | HR 81 | Resp 18 | Ht 63.0 in | Wt 154.0 lb

## 2016-06-06 DIAGNOSIS — N979 Female infertility, unspecified: Secondary | ICD-10-CM | POA: Diagnosis not present

## 2016-06-06 NOTE — Progress Notes (Signed)
   Subjective:    Patient ID: Adele BarthelChristine Sosa is a 36 y.o. female presenting with Follow-up  on 06/06/2016  HPI: Out of work x 5 wks. Has 6 wk check up with orthopedist on next week. Pain in abdomen is improved. Still has some pain which has improved. Notes pain is worse right before cycle. Taking NSAIDS which is helping. Menstrual cycle is still regular and cycle. It is monthly and light and lasts 3-4 days.  Review of Systems  Constitutional: Negative for chills and fever.  Respiratory: Negative for shortness of breath.   Cardiovascular: Negative for chest pain.  Gastrointestinal: Negative for abdominal pain, nausea and vomiting.  Genitourinary: Negative for dysuria.  Skin: Negative for rash.      Objective:    BP 117/83 (BP Location: Left Arm, Patient Position: Sitting, Cuff Size: Normal)   Pulse 81   Resp 18   Ht 5\' 3"  (1.6 m)   Wt 154 lb (69.9 kg)   LMP 05/23/2016   BMI 27.28 kg/m  Physical Exam  Constitutional: She is oriented to person, place, and time. She appears well-developed and well-nourished. No distress.  HENT:  Head: Normocephalic and atraumatic.  Eyes: No scleral icterus.  Neck: Neck supple.  Cardiovascular: Normal rate.   Pulmonary/Chest: Effort normal.  Abdominal: Soft.  Neurological: She is alert and oriented to person, place, and time.  Skin: Skin is warm and dry.  Psychiatric: She has a normal mood and affect.        Assessment & Plan:   Problem List Items Addressed This Visit      Unprioritized   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 problem - Primary    Begin w/u with labs today, semen analysis and schedule HSG.      Relevant Orders   DG Hysterogram (HSG)   Semen Analysis, Basic   Follicle stimulating hormone   TSH   Hemoglobin A1c   ReproSURE      Total face-to-face time with patient: 15 minutes. Over 50% of encounter was spent on counseling and coordination of care. Return if symptoms worsen or fail to improve.  Reva Boresanya S Aulton Routt 06/06/2016 1:38  PM

## 2016-06-06 NOTE — Patient Instructions (Signed)
Infertility Infertility is when you are unable to get pregnant (conceive) after a year of having sex regularly without using birth control. Infertility can also mean that a woman is not able to carry a pregnancy to full term. Both women and men can have fertility problems. What causes infertility? What Causes Infertility in Women?  There are many possible causes of infertility in women. For some women, the cause of infertility is not known (unexplained infertility). Infertility can also be linked to more than one cause. Infertility problems in women can be caused by problems with the menstrual cycle or reproductive organs, certain medical conditions, and factors related to lifestyle and age.  Problems with your menstrual cycle can interfere with your ovaries producing eggs (ovulation). This can make it difficult to get pregnant. This includes having a menstrual cycle that is very long, very short, or irregular.  Problems with reproductive organs can include:  An abnormally narrow cervix or a cervix that does not remain closed during a pregnancy.  A blockage in your fallopian tubes.  An abnormally shaped uterus.  Uterine fibroids. This is a tissue mass (tumor) that can develop on your uterus.  Medical conditions that can affect a woman's fertility include:  Polycystic ovarian syndrome (PCOS). This is a hormonal disorder that can cause small cysts to grow on your ovaries. This is the most common cause of infertility in women.  Endometriosis. This is a condition in which the tissue that lines your uterus (endometrium) grows outside of its normal location.  Primary ovary insufficiency. This is when your ovaries stop producing eggs and hormones before the age of 40.  Sexually transmitted diseases, such as chlamydia or gonorrhea. These infections can cause scarring in your fallopian tubes. This makes it difficult for eggs to reach your uterus.  Autoimmune disorders. These are disorders in  which your immune system attacks normal, healthy cells.  Hormone imbalances.  Other factors include:  Age. A woman's fertility declines with age, especially after her mid-30s.  Being under- or overweight.  Drinking too much alcohol.  Using drugs.  Exercising excessively.  Being exposed to environmental toxins, such as radiation, pesticides, and certain chemicals. What Causes Infertility in Men?  There are many causes of infertility in men. Infertility can be linked to more than one cause. Infertility problems in men can be caused by problems with sperm or the reproductive organs, certain medical conditions, and factors related to lifestyle and age. Some men have unexplained infertility.  Problems with sperm. Infertility can result if there is a problem producing:  Enough sperm (low sperm count).  Enough normally-shaped sperm (sperm morphology).  Sperm that are able to reach the egg (poor motility).  Infertility can also be caused by:  A problem with hormones.  Enlarged veins (varicoceles), cysts (spermatoceles), or tumors of the testicles.  Sexual dysfunction.  Injury to the testicles.  A birth defect, such as not having the tubes that carry sperm (vas deferens).  Medical conditions that can affect a man's fertility include:  Diabetes.  Cancer treatments, such as chemotherapy or radiation.  Klinefelter syndrome. This is an inherited genetic disorder.  Thyroid problems, such as an under- or overactive thyroid.  Cystic fibrosis.  Sexually transmitted diseases.  Other factors include:  Age. A man's fertility declines with age.  Drinking too much alcohol.  Using drugs.  Being exposed to environmental toxins, such as pesticides and lead. What are the symptoms of infertility? Being unable to get pregnant after one year of having regular   sex without using birth control is the only sign of infertility. How is infertility diagnosed? In order to be diagnosed  with infertility, both partners will have a physical exam. Both partners will also have an extensive medical and sexual history taken. If there is no obvious reason for infertility, additional tests may be done. What Tests Will Women Have?  Women may first have tests to check whether they are ovulating each month. The tests may include:  Blood tests to check hormone levels.  An ultrasound of the ovaries. This looks for possible problems on or in the ovaries.  Taking a small sample of the tissue that lines the uterus for examination under a microscope (endometrial biopsy). Women who are ovulating may have additional tests. These may include:  Hysterosalpingography.  This is an X-ray of the fallopian tubes and uterus taken after a specific type of dye is injected.  This test can show the shape of the uterus and whether the fallopian tubes are open.  Laparoscopy.  In this test, a lighted tube (laparoscope) is used to look for problems in the fallopian tubes and other female organs.  Transvaginal ultrasound.  This is an imaging test to check for abnormalities of the uterus and ovaries.  A health care provider can use this test to count the number of follicles on the ovaries.  Hysteroscopy.  This test involves using a lighted tube to examine the cervix and inside the uterus.  It is done to find any abnormalities inside the uterus. What Tests Will Men Have?  Tests for men's infertility includes:  Semen tests to check sperm count, morphology, and motility.  Blood tests to check for hormone levels.  Taking a small sample of tissue from inside a testicle (biopsy). This is examined under a microscope.  Blood tests to check for genetic abnormalities (genetic testing). How are women treated for infertility? Treatment depends on the cause of infertility. Most cases of infertility in women are treated with medicine or surgery.  Women may take medicine to:  Correct ovulation  problems.  Treat other health conditions, such as PCOS.  Surgery may be done to:  Repair damage to the ovaries, fallopian tubes, cervix, or uterus.  Remove growths from the uterus.  Remove scar tissue from the uterus, pelvis, or other female organs. How are men treated for infertility? Treatment depends on the cause of infertility. Most cases of infertility in men are treated with medicine or surgery.  Men may take medicine to:  Correct hormone problems.  Treat other health conditions.  Treat sexual dysfunction.  Surgery may be done to:  Remove blockages in the reproductive tract.  Correct other structural problems of the reproductive tract. What is assisted reproductive technology? Assisted reproductive technology (ART) refers to all treatments and procedures that combine eggs and sperm outside the body to try to help a couple conceive. ART is often combined with fertility drugs to stimulate ovulation. Sometimes ART is done using eggs retrieved from another woman's body (donor eggs) or from previously frozen fertilized eggs (embryos). There are different types of ART. These include:  Intrauterine insemination (IUI).  In this procedure, sperm is placed directly into a woman's uterus with a long, thin tube.  This may be most effective for infertility caused by sperm problems, including low sperm count and low motility.  Can be used in combination with fertility drugs.  In vitro fertilization (IVF).  This is often done when a woman's fallopian tubes are blocked or when a man has   low sperm counts.  Fertility drugs stimulate the ovaries to produce multiple eggs. Once mature, these eggs are removed from the body and combined with the sperm to be fertilized.  These fertilized eggs are then placed in the woman's uterus. This information is not intended to replace advice given to you by your health care provider. Make sure you discuss any questions you have with your health care  provider. Document Released: 01/19/2003 Document Revised: 06/18/2015 Document Reviewed: 10/01/2013 Elsevier Interactive Patient Education  2017 Elsevier Inc.  

## 2016-06-06 NOTE — Assessment & Plan Note (Signed)
Begin w/u with labs today, semen analysis and schedule HSG.

## 2016-06-11 LAB — REPROSURE
ANTI-MULLERIAN HORMONE (AMH): 2.95 ng/mL
Estradiol, Serum, MS: 232 pg/mL
FSH: 14 m[IU]/mL

## 2016-06-11 LAB — HEMOGLOBIN A1C
Est. average glucose Bld gHb Est-mCnc: 108 mg/dL
Hgb A1c MFr Bld: 5.4 % (ref 4.8–5.6)

## 2016-06-11 LAB — FOLLICLE STIMULATING HORMONE: FSH: 14.7 m[IU]/mL

## 2016-06-11 LAB — REPROSURE PATIENT EDUCATION: PDF Image: 0

## 2016-06-11 LAB — TSH: TSH: 1.03 u[IU]/mL (ref 0.450–4.500)

## 2016-08-11 ENCOUNTER — Encounter: Payer: Self-pay | Admitting: Family Medicine

## 2016-08-11 ENCOUNTER — Ambulatory Visit (INDEPENDENT_AMBULATORY_CARE_PROVIDER_SITE_OTHER): Payer: BLUE CROSS/BLUE SHIELD | Admitting: Family Medicine

## 2016-08-11 VITALS — BP 122/68 | HR 81 | Temp 98.5°F | Ht 63.0 in | Wt 151.5 lb

## 2016-08-11 DIAGNOSIS — F341 Dysthymic disorder: Secondary | ICD-10-CM

## 2016-08-11 DIAGNOSIS — F17200 Nicotine dependence, unspecified, uncomplicated: Secondary | ICD-10-CM

## 2016-08-11 MED ORDER — ALPRAZOLAM 0.5 MG PO TABS: 0.5000 mg | ORAL_TABLET | Freq: Every day | ORAL | 0 refills | Status: DC | PRN

## 2016-08-11 MED ORDER — BUSPIRONE HCL 30 MG PO TABS: 30.0000 mg | ORAL_TABLET | Freq: Two times a day (BID) | ORAL | 11 refills | Status: DC

## 2016-08-11 NOTE — Progress Notes (Signed)
Subjective:    Patient ID: Shannon Dyer, female    DOB: [birth year], 36 y.o.   MRN: [id number]  HPI  Here for c/o of anxiety  She has had this for a while   Similar symptoms to the past   (for the past month) Episodes of rapid breathing and heartbeat  More frequent lately -at least daily  Tries to settle herself down with breathing exercises  Also not sleeping well - problems falling and staying asleep (3-4 hours per nt)  Brain races -thinking about husband/work/mom  Appetite is decreased   Not feeling very down or sad   More irritable  Can fly off the handle easily  Takes less to set her off  Job at [employer] - lot of responsibility and she loves it (very active and physical)  Stressors: getting a divorce (it is a good thing) - process is going as well as expected  She was not being abused in any way  Had to have a reconstructive elbow surgery (much better now)  No other relationships currently  Work is pretty much the same Father died 2 y ago (this was tough) - worries about her mother (but she is doing well)    Has done counseling in the past (here)- did not help very much   She has a friend that she talks to to vent  [place]   Not going through menopause that she knows of   Wt Readings from Last 3 Encounters:  08/11/16 151 lb 8 oz (68.7 kg)  06/06/16 154 lb (69.9 kg)  02/24/16 149 lb 12.8 oz (67.9 kg)   26.84 kg/m    In the past she has been on paxil and gabapentin and wellbutrin and xanax and remeron at different times  Thinks she tried zoloft in the past  Cannot remember what worked or did not  Xanax was most effective    Smoking status : she is smoking 1/2 ppd (doing ok in effort to quit when mood improves) Used a vape temporarily   Patient Active Problem List   Diagnosis Date Noted  . Smoker 08/11/2016  . Pelvic pain in female 02/14/2016  . Nausea with vomiting 02/14/2016  . History of HPV infection  02/14/2016  . Dyspepsia 02/14/2016  . Myalgia 11/21/2012  . Encounter for routine gynecological examination 05/22/2012  . 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 BACK PAIN 02/16/2010  . ANXIETY DEPRESSION 06/30/2008  . GANGLION CYST, WRIST, LEFT 07/25/2007   Past Medical History:  Diagnosis Date  . Tobacco abuse    Past Surgical History:  Procedure Laterality Date  . cystic ovarian mass  01/2005  . ELBOW LIGAMENT RECONSTRUCTION     Social History  Substance Use Topics  . Smoking status: Heavy Tobacco Smoker    Packs/day: 0.50    Years: 7.00    Types: Cigarettes  . Smokeless tobacco: Never Used  . Alcohol use No   Family History  Problem Relation Age of Onset  . Hypertension Father    No Known Allergies Current Outpatient Prescriptions on File Prior to Visit  Medication Sig Dispense Refill  . diphenhydrAMINE (BENADRYL) 25 MG tablet Take 25 mg by mouth every 6 (six) hours as needed.     No current facility-administered medications on file prior to visit.     Review of Systems Review of Systems  Constitutional: neg for fever or malaise/ pos for appetite change and fatigue  Eyes: Negative for  pain and visual disturbance.  Respiratory: Negative for cough and shortness of breath.   Cardiovascular: Negative for cp or palpitations    Gastrointestinal: Negative for nausea, diarrhea and constipation.  Genitourinary: Negative for urgency and frequency.  Skin: Negative for pallor or rash   Neurological: Negative for weakness, light-headedness, numbness and headaches.  Hematological: Negative for adenopathy. Does not bruise/bleed easily.  Psychiatric/Behavioral: pos for occ mild dysphoric mood, pos for mod anxiety with panic symptoms, neg for SI       Objective:   Physical Exam  Constitutional: She appears well-developed and well-nourished. No distress.  overwt and well appearing  HENT:  Head: Normocephalic and atraumatic.  Mouth/Throat: Oropharynx is clear and moist.    Eyes: Pupils are equal, round, and reactive to light. Conjunctivae and EOM are normal.  Neck: Normal range of motion. Neck supple. No JVD present. Carotid bruit is not present. No thyromegaly present.  Cardiovascular: Normal rate, regular rhythm, normal heart sounds and intact distal pulses.  Exam reveals no gallop.   Pulmonary/Chest: Effort normal and breath sounds normal. No respiratory distress. She has no wheezes. She has no rales.  No crackles  Abdominal: Soft. Bowel sounds are normal. She exhibits no distension, no abdominal bruit and no mass. There is no tenderness.  Musculoskeletal: She exhibits no edema.  Lymphadenopathy:    She has no cervical adenopathy.  Neurological: She is alert. She has normal reflexes. She displays tremor. No cranial nerve deficit. She exhibits normal muscle tone. Coordination normal.  Mild anxious hand tremor   Skin: Skin is warm and dry. No rash noted.  Psychiatric: Her speech is normal and behavior is normal. Thought content normal. Her mood appears anxious. Her affect is not blunt, not labile and not inappropriate. Thought content is not paranoid. Cognition and memory are normal. She does not exhibit a depressed mood. She expresses no homicidal and no suicidal ideation.  Pleasant and talkative with good insight          Assessment & Plan:   Problem List Items Addressed This Visit      Other   ANXIETY DEPRESSION - Primary    This has come and gone with time- primarily anxiety now with some panic symptoms Reviewed stressors/ coping techniques/symptoms/ support sources/ tx options and side effects in detail today Rev old hx and trial of different medicines (incl psychiatry)  Declines counseling - states did not help in the past  Disc meditation and self care Px buspar to start 15 mg bid and titrate to 30  Discussed expectations of this medication including time to effectiveness and mechanism of action, also poss of side effects (early and late)-  including mental fuzziness, weight or appetite change, nausea and poss of worse dep or anxiety (even suicidal thoughts)  Pt voiced understanding and will stop med and update if this occurs   F/u approx 6 wk Alert if necessary before then for problems or changes  >25 minutes spent in face to face time with patient, >50% spent in counselling or coordination of care        Relevant Medications   busPIRone (BUSPAR) 30 MG tablet   ALPRAZolam (XANAX) 0.5 MG tablet   Smoker    Disc in detail risks of smoking and possible outcomes including copd, vascular/ heart disease, cancer , respiratory and sinus infections  Pt voices understanding She is not ready to quit but may be soon

## 2016-08-11 NOTE — Patient Instructions (Addendum)
Keep going hiking in the mountains / get outdoors as much as you can  Keep exercising (it's good your job is so physical)  Keep talking to your friend  Write in a journal if it help If you become interested in meditation - the Thereasa Parkinauthor Nyoka LintJon Kabbit-Zinn is excellent  It takes a lot of practice but really helps   Let's start buspar for anxiety  15 mg (1/2 pill)  twice daily for 2 weeks and then increase to 30 mg twice daily if you want to  Follow up here in about 6 weeks   If you have any side effects or problems stop it and let me know  If you get worse instead of better also stop it and let us know   Keep xanax on hand for emergencies (don't drive with it )  Keep thinking about quitting smoking

## 2016-08-13 NOTE — Assessment & Plan Note (Addendum)
This has come and gone with time- primarily anxiety now with some panic symptoms Reviewed stressors/ coping techniques/symptoms/ support sources/ tx options and side effects in detail today Rev old hx and trial of different medicines (incl psychiatry)  Declines counseling - states did not help in the past  Disc meditation and self care Px buspar to start 15 mg bid and titrate to 30  Discussed expectations of this medication including time to effectiveness and mechanism of action, also poss of side effects (early and late)- including mental fuzziness, weight or appetite change, nausea and poss of worse dep or anxiety (even suicidal thoughts)  Pt voiced understanding and will stop med and update if this occurs   F/u approx 6 wk Alert if necessary before then for problems or changes  >25 minutes spent in face to face time with patient, >50% spent in counselling or coordination of care

## 2016-08-13 NOTE — Assessment & Plan Note (Signed)
Disc in detail risks of smoking and possible outcomes including copd, vascular/ heart disease, cancer , respiratory and sinus infections  Pt voices understanding She is not ready to quit but may be soon

## 2016-08-30 ENCOUNTER — Telehealth: Payer: Self-pay

## 2016-08-30 NOTE — Telephone Encounter (Signed)
Pt requesting increase on buspar and refill xanax. Both  meds filled when last seen on 08/11/16. No SI/HI ; pt does not want to go to different [facility] office. Pt scheduled appt on 08/31/16 at 2 pm with Pamala Hurry Baity NP.(Dr Milinda Antisower out of office) Pt will cb if condition worsened prior to appt.

## 2016-08-30 NOTE — Telephone Encounter (Signed)
Will discuss at upcoming appt.

## 2016-08-31 ENCOUNTER — Ambulatory Visit (INDEPENDENT_AMBULATORY_CARE_PROVIDER_SITE_OTHER): Payer: BLUE CROSS/BLUE SHIELD | Admitting: Internal Medicine

## 2016-08-31 ENCOUNTER — Encounter: Payer: Self-pay | Admitting: Internal Medicine

## 2016-08-31 DIAGNOSIS — F341 Dysthymic disorder: Secondary | ICD-10-CM

## 2016-08-31 MED ORDER — BUSPIRONE HCL 10 MG PO TABS: 20.0000 mg | ORAL_TABLET | Freq: Three times a day (TID) | ORAL | 0 refills | Status: DC

## 2016-08-31 NOTE — Patient Instructions (Signed)

## 2016-08-31 NOTE — Progress Notes (Signed)
Subjective:    Patient ID: Shannon Dyer, female    DOB: [birth year], 36 y.o.   MRN: [id number]  HPI  Pt presents to the clinic today to follow up anxiety. She saw her PCP for the same. She declined counseling at that time. She was started on Buspar (advised to titrate up to 30 mg BID) and given a RX for Xanax. She has been taking the Buspar as prescribed. She is out of Xanax because she has been taking it 2-3 times a day instead of daily as prescribed. She is requesting a refill of this today. She feels like her anxiety has been worse over the last 2 weeks. She feels like the Buspar is wearing off before the next dose is due. She denies SI/HI.  Review of Systems      Past Medical History:  Diagnosis Date  . Tobacco abuse     Current Outpatient Prescriptions  Medication Sig Dispense Refill  . ALPRAZolam (XANAX) 0.5 MG tablet Take 1 tablet (0.5 mg total) by mouth daily as needed for anxiety. 30 tablet 0  . busPIRone (BUSPAR) 30 MG tablet Take 1 tablet (30 mg total) by mouth 2 (two) times daily. 30 tablet 11  . diphenhydrAMINE (BENADRYL) 25 MG tablet Take 25 mg by mouth every 6 (six) hours as needed.     No current facility-administered medications for this visit.     No Known Allergies  Family History  Problem Relation Age of Onset  . Hypertension Father     Social History   Social History  . Marital status: Single    Spouse name: N/A  . Number of children: N/A  . Years of education: N/A   Occupational History  . Not on file.   Social History Main Topics  . Smoking status: Heavy Tobacco Smoker    Packs/day: 0.50    Years: 7.00    Types: Cigarettes  . Smokeless tobacco: Never Used  . Alcohol use No  . Drug use: No  . Sexual activity: Yes    Birth control/ protection: None   Other Topics Concern  . Not on file   Social History Narrative  . No narrative on file     Constitutional: Denies fever, malaise, fatigue, headache or abrupt weight changes.    Neurological: Denies dizziness, difficulty with memory, difficulty with speech or problems with balance and coordination.  Psych: Pt reports anxiety. Denies depression, SI/HI.  No other specific complaints in a complete review of systems (except as listed in HPI above).  Objective:   Physical Exam  BP 118/80   Pulse 90   Temp 98 F (36.7 C) (Oral)   Wt 150 lb 12 oz (68.4 kg)   SpO2 98%   BMI 26.70 kg/m  Wt Readings from Last 3 Encounters:  08/31/16 150 lb 12 oz (68.4 kg)  08/11/16 151 lb 8 oz (68.7 kg)  06/06/16 154 lb (69.9 kg)    General: Appears her stated age, well developed, well nourished in NAD. Neurological: Alert and oriented.  Psychiatric: She is tearful today.  BMET    Component Value Date/Time   NA 137 02/11/2016 1749   K 3.7 02/11/2016 1749   CL 106 02/11/2016 1749   CO2 25 02/11/2016 1749   GLUCOSE 106 (H) 02/11/2016 1749   BUN 10 02/11/2016 1749   CREATININE 0.87 02/11/2016 1749   CALCIUM 9.5 02/11/2016 1749   GFRNONAA >60 02/11/2016 1749   GFRAA >60 02/11/2016 1749    Lipid Panel  No results found for: CHOL, TRIG, HDL, CHOLHDL, VLDL, LDLCALC  CBC    Component Value Date/Time   WBC 9.1 02/11/2016 1749   RBC 4.61 02/11/2016 1749   HGB 15.2 (H) 02/11/2016 1749   HCT 43.5 02/11/2016 1749   PLT 349 02/11/2016 1749   MCV 94.4 02/11/2016 1749   MCH 33.0 02/11/2016 1749   MCHC 34.9 02/11/2016 1749   RDW 13.1 02/11/2016 1749   LYMPHSABS 2.7 02/09/2016 1502   MONOABS 0.7 02/09/2016 1502   EOSABS 0.0 02/09/2016 1502   BASOSABS 0.0 02/09/2016 1502    Hgb A1C Lab Results  Component Value Date   HGBA1C 5.4 06/06/2016            Assessment & Plan:

## 2016-08-31 NOTE — Assessment & Plan Note (Signed)
Lets try giving her a smaller dose of Buspar, more frequently D/C Buspar 30 mg BID Start Buspar 10 mg tabs  (2 tabs TID) Advised her I could not refill Xanax, she was given a 30 day supply. She will call back to discuss this with PCP Offered counseling again, she declines If Buspar ineffective, can try Paxil which has worked for her in the past

## 2016-09-04 ENCOUNTER — Other Ambulatory Visit: Payer: Self-pay

## 2016-09-04 MED ORDER — ALPRAZOLAM 0.5 MG PO TABS: 0.5000 mg | ORAL_TABLET | Freq: Two times a day (BID) | ORAL | 0 refills | Status: DC | PRN

## 2016-09-04 MED ORDER — BUSPIRONE HCL 30 MG PO TABS: 30.0000 mg | ORAL_TABLET | Freq: Three times a day (TID) | ORAL | 5 refills | Status: DC

## 2016-09-04 NOTE — Telephone Encounter (Signed)
Px written for call in    I would like to push her buspar to 30 mg tid -I think this would help more than 10 mg and she should tolerate it well  Please ask if she is open to this and I will send it in   If not helpful- would change to a different medicine   She has a f/u end of the mo   Thanks

## 2016-09-04 NOTE — Telephone Encounter (Addendum)
Medication phoned to pharmacy. Alprazolam.  Patient is currently taking Buspar 10 mg, 2 tabs (total 20 mg) TID.  Patient advised to push up to 3 tabs (30 mg total) TID and is open to this plan.  Please send in new Rx.

## 2016-09-04 NOTE — Telephone Encounter (Signed)
Ok-go up to 30 mg tid  I sent new px to the pharmacy thanks

## 2016-09-04 NOTE — Telephone Encounter (Signed)
Pt was seen 08/31/16 but could not get refill on xanax; xanax last refilled # 30 on 08/11/16; pt thought she could take twice daily if needed; that is why pt is out of med early. Pt request refill xanax to [facility].Please advise.

## 2016-09-05 NOTE — Telephone Encounter (Signed)
Patient notified as instructed by telephone and verbalized understanding. 

## 2016-09-25 ENCOUNTER — Ambulatory Visit (INDEPENDENT_AMBULATORY_CARE_PROVIDER_SITE_OTHER): Payer: BLUE CROSS/BLUE SHIELD | Admitting: Family Medicine

## 2016-09-25 ENCOUNTER — Encounter: Payer: Self-pay | Admitting: Family Medicine

## 2016-09-25 VITALS — BP 110/64 | HR 90 | Temp 98.4°F | Ht 63.0 in | Wt 150.5 lb

## 2016-09-25 DIAGNOSIS — F341 Dysthymic disorder: Secondary | ICD-10-CM | POA: Diagnosis not present

## 2016-09-25 MED ORDER — VENLAFAXINE HCL ER 37.5 MG PO CP24: 37.5000 mg | ORAL_CAPSULE | Freq: Every day | ORAL | 3 refills | Status: DC

## 2016-09-25 NOTE — Patient Instructions (Addendum)
Start effexor xr 37.5 mg once daily  Cut buspar to 15 mg three times daily for a week  Then decrease to 15 mg once daily for a week  Then stop buspar  We will refer you to psychiatry  Keep talking to friends and think about a little exercise and outdoor time   Have the pharmacy call when you need the xanax refilled   If you have side effects to effexor - or feel worse on it, please stop it and let us know

## 2016-09-25 NOTE — Progress Notes (Signed)
Subjective:    Patient ID: Shannon Dyer, female    DOB: [birth year], 36 y.o.   MRN: [id number]  HPI Here for f/u of chronic medical problems   Wt Readings from Last 3 Encounters:  09/25/16 150 lb 8 oz (68.3 kg)  08/31/16 150 lb 12 oz (68.4 kg)  08/11/16 151 lb 8 oz (68.7 kg)   26.66 kg/m  Last visit seen for anxiety  Started on buspar 15 bid to titrate to 30 bid Found herself needing more xanax  Sat NP Baity who proposed tid dosing   Now up to 30 mg tid   Not working  Not taking the edge off at all   Anxious  Now feels a little more depressed as well (wants to isolate herself more)  Also irritable   Her ex husband found out he has pancreatic cancer - does not want her involved  Getting mixed signals from him   She has tried meditating - it is difficult  Not as active the past 2 weeks exercise wise  Sits outside -but not hiking   Counseling did not help in the past  Talks to some friends (but does not feel like it) -- has support if she needs it   Is interested in psychiatry again (has been a while)  Can't remember name of her last doctor    Patient Active Problem List   Diagnosis Date Noted  . Smoker 08/11/2016  . Nausea with vomiting 02/14/2016  . 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 Dyspepsia 02/14/2016  . Myalgia 11/21/2012  . Encounter for routine gynecological examination 05/22/2012  . 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 BACK PAIN 02/16/2010  . ANXIETY DEPRESSION 06/30/2008  . GANGLION CYST, WRIST, LEFT 07/25/2007   Past Medical History:  Diagnosis Date  . Tobacco abuse    Past Surgical History:  Procedure Laterality Date  . cystic ovarian mass  01/2005  . ELBOW LIGAMENT RECONSTRUCTION     Social History  Substance Use Topics  . Smoking status: Current Every Day Smoker    Packs/day: 0.50    Years: 7.00    Types: Cigarettes  . Smokeless tobacco: Never Used  . Alcohol use No   Family History  Problem Relation Age of Onset  .  Hypertension Father    No Known Allergies Current Outpatient Prescriptions on File Prior to Visit  Medication Sig Dispense Refill  . ALPRAZolam (XANAX) 0.5 MG tablet Take 1 tablet (0.5 mg total) by mouth 2 (two) times daily as needed for anxiety. 60 tablet 0  . diphenhydrAMINE (BENADRYL) 25 MG tablet Take 25 mg by mouth every 6 (six) hours as needed.     No current facility-administered medications on file prior to visit.     Review of Systems Review of Systems  Constitutional: Negative for fever, appetite change, fatigue and unexpected weight change.  Eyes: Negative for pain and visual disturbance.  Respiratory: Negative for cough and shortness of breath.   Cardiovascular: Negative for cp or palpitations    Gastrointestinal: Negative for nausea, diarrhea and constipation.  Genitourinary: Negative for urgency and frequency.  Skin: Negative for pallor or rash   Neurological: Negative for weakness, light-headedness, numbness and headaches.  Hematological: Negative for adenopathy. Does not bruise/bleed easily.  Psychiatric/Behavioral: pos for anxious and dysphoric mood with tearfulness and neg for SI , pos for stressors         Objective:   Physical Exam  Constitutional: She appears well-developed and well-nourished. No distress.  Well  appearing    HENT:  Head: Normocephalic and atraumatic.  Eyes: Pupils are equal, round, and reactive to light. Conjunctivae and EOM are normal. No scleral icterus.  Neck: Normal range of motion. Neck supple. No thyromegaly present.  Cardiovascular: Normal rate, regular rhythm and normal heart sounds.   Pulmonary/Chest: Effort normal and breath sounds normal.  Musculoskeletal: She exhibits no edema.  Lymphadenopathy:    She has no cervical adenopathy.  Neurological: She is alert. She has normal reflexes. She displays tremor. No cranial nerve deficit. She exhibits normal muscle tone. Coordination and gait normal.  Mild anxious hand tremor   Skin: No  rash noted. No pallor.  Psychiatric: Her speech is normal and behavior is normal. Thought content normal. Her mood appears anxious. Her affect is blunt. Thought content is not paranoid. Cognition and memory are normal. She exhibits a depressed mood. She expresses no homicidal and no suicidal ideation.  Candid about symptoms and stressors  Mildly tearful at times           Assessment & Plan:   Problem List Items Addressed This Visit      Other   ANXIETY DEPRESSION - Primary    Per pt no improvement with buspar tid 30 , in fact now more depression symptoms with inc in stressors  Reviewed stressors/ coping techniques/symptoms/ support sources/ tx options and side effects in detail today  Disc self care/exercise and support sources Declines counseling  On xanax bid prn  Rev list of failed meds incl paxil, (poss zoloft), remeron, wellbutrin and now buspar  Disc snri - trial of effexor xr 37.5 daily  Ref to psychiatry for further eval /tx for complex case Wean off buspar  No SI-will update if this changes  Discussed expectations of SNRI medication including time to effectiveness and mechanism of action, also poss of side effects (early and late)- including mental fuzziness, weight or appetite change, nausea and poss of worse dep or anxiety (even suicidal thoughts)  Pt voiced understanding and will stop med and update if this occurs   >25 minutes spent in face to face time with patient, >50% spent in counselling or coordination of care (including discussion of meditation and other stress relieving practices and red flags for worsening sympotms)         Relevant Medications   venlafaxine XR (EFFEXOR XR) 37.5 MG 24 hr capsule   Other Relevant Orders   Ambulatory referral to Psychiatry

## 2016-09-25 NOTE — Assessment & Plan Note (Addendum)
Per pt no improvement with buspar tid 30 , in fact now more depression symptoms with inc in stressors  Reviewed stressors/ coping techniques/symptoms/ support sources/ tx options and side effects in detail today  Disc self care/exercise and support sources Declines counseling  On xanax bid prn  Rev list of failed meds incl paxil, (poss zoloft), remeron, wellbutrin and now buspar  Disc snri - trial of effexor xr 37.5 daily  Ref to psychiatry for further eval /tx for complex case Wean off buspar  No SI-will update if this changes  Discussed expectations of SNRI medication including time to effectiveness and mechanism of action, also poss of side effects (early and late)- including mental fuzziness, weight or appetite change, nausea and poss of worse dep or anxiety (even suicidal thoughts)  Pt voiced understanding and will stop med and update if this occurs   >25 minutes spent in face to face time with patient, >50% spent in counselling or coordination of care (including discussion of meditation and other stress relieving practices and red flags for worsening sympotms)

## 2016-10-09 ENCOUNTER — Telehealth: Payer: Self-pay | Admitting: *Deleted

## 2016-10-09 MED ORDER — ALPRAZOLAM 0.5 MG PO TABS: 0.5000 mg | ORAL_TABLET | Freq: Two times a day (BID) | ORAL | 0 refills | Status: DC | PRN

## 2016-10-09 NOTE — Telephone Encounter (Signed)
Rx called in as prescribed 

## 2016-10-09 NOTE — Telephone Encounter (Signed)
Patient left a voicemail requesting a refill on Xanax Last refill 09/04/16 #60 Last office visit 09/25/16

## 2016-10-09 NOTE — Telephone Encounter (Signed)
Thanks

## 2016-10-09 NOTE — Telephone Encounter (Signed)
[facility] and they do not have a patient on file with that DOB. Called patient and was advised that the [facility] should have been [facility]. Advised patient that she has two pharmacies on file and the script was called to [facility]. Patient stated that she thought she left [facility] on the message. Advised patient in the future to have the pharmacy request the refill and it will be sent back to that pharmacy and this mix up will not happen again. Called [facility] back and had them cancel the script for Xanax.  Script called to [facility] per patient's request.

## 2016-10-09 NOTE — Telephone Encounter (Signed)
Px written for call in   

## 2016-10-19 ENCOUNTER — Emergency Department (HOSPITAL_COMMUNITY): Payer: BLUE CROSS/BLUE SHIELD

## 2016-10-19 ENCOUNTER — Encounter (HOSPITAL_COMMUNITY): Payer: Self-pay | Admitting: Emergency Medicine

## 2016-10-19 ENCOUNTER — Emergency Department (HOSPITAL_COMMUNITY)
Admission: EM | Admit: 2016-10-19 | Discharge: 2016-10-19 | Disposition: A | Discharge status: Home / Self Care | Payer: BLUE CROSS/BLUE SHIELD | Attending: Emergency Medicine | Admitting: Emergency Medicine

## 2016-10-19 DIAGNOSIS — Z79899 Other long term (current) drug therapy: Secondary | ICD-10-CM | POA: Insufficient documentation

## 2016-10-19 DIAGNOSIS — R0789 Other chest pain: Secondary | ICD-10-CM | POA: Diagnosis present

## 2016-10-19 DIAGNOSIS — R252 Cramp and spasm: Secondary | ICD-10-CM | POA: Diagnosis not present

## 2016-10-19 DIAGNOSIS — F17210 Nicotine dependence, cigarettes, uncomplicated: Secondary | ICD-10-CM | POA: Diagnosis not present

## 2016-10-19 DIAGNOSIS — Z7982 Long term (current) use of aspirin: Secondary | ICD-10-CM | POA: Insufficient documentation

## 2016-10-19 LAB — I-STAT TROPONIN, ED
Troponin i, poc: 0 ng/mL (ref 0.00–0.08)
Troponin i, poc: 0 ng/mL (ref 0.00–0.08)

## 2016-10-19 LAB — CBC
HCT: 41.3 % (ref 36.0–46.0)
Hemoglobin: 13.9 g/dL (ref 12.0–15.0)
MCH: 31.3 pg (ref 26.0–34.0)
MCHC: 33.7 g/dL (ref 30.0–36.0)
MCV: 93 fL (ref 78.0–100.0)
Platelets: 273 10*3/uL (ref 150–400)
RBC: 4.44 MIL/uL (ref 3.87–5.11)
RDW: 12.9 % (ref 11.5–15.5)
WBC: 4.7 10*3/uL (ref 4.0–10.5)

## 2016-10-19 LAB — BASIC METABOLIC PANEL WITH GFR
Anion gap: 9 (ref 5–15)
BUN: 9 mg/dL (ref 6–20)
CO2: 22 mmol/L (ref 22–32)
Calcium: 9.8 mg/dL (ref 8.9–10.3)
Chloride: 105 mmol/L (ref 101–111)
Creatinine, Ser: 0.88 mg/dL (ref 0.44–1.00)
GFR calc Af Amer: >60 mL/min
GFR calc non Af Amer: >60 mL/min
Glucose, Bld: 88 mg/dL (ref 65–99)
Potassium: 3.9 mmol/L (ref 3.5–5.1)
Sodium: 136 mmol/L (ref 135–145)

## 2016-10-19 NOTE — ED Triage Notes (Signed)
Pt BIB EMS from [employer] where she works. Began having epigastric pain around 0330 that became substernal CP, 10/10 & sharp. Denies SOB, N/V. Anxious & hyperventilating upon arrival of EMS. Coached on breathing and CP largely resolved as anxiety subsided. En route pt began to exhibit spasms to both hands and feet. Pt reports both as numb with little motor response. Took  ASA prior to EMS arrival.

## 2016-10-19 NOTE — ED Provider Notes (Addendum)
[facility]-EMERGENCY DEPT Provider Note   CSN: [id number] Arrival date & time: 10/19/16  1914     History   Chief Complaint Chief Complaint  Patient presents with  . Chest Pain  . Spasms    HPI Shannon Dyer is a 36 y.o. female.  Patient is a 36 year old female with a history of anxietywho presents with chest pain. It started about 3:30 this morning when she was at work walking. She describes as a burning pain to the center of her chest. It was otherwise nonradiating.  She denies any associated shortness of breath. No nausea or vomiting. No diaphoresis. She was hyperventilating on arrival and began having cramping of her hands and arms. She denies any prior history of heart disease. She is a smoker. She denies any diabetes, hypertension or hyperlipidemia. She does state that her dad had a heart attack in his 53s. She denies any leg pain or swelling. No recent immobilization. No pleuritic type chest pain.  She currently denies any pain.      Past Medical History:  Diagnosis Date  . Tobacco abuse     Patient Active Problem List   Diagnosis Date Noted  . Smoker 08/11/2016  . Nausea with vomiting 02/14/2016  . 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 Dyspepsia 02/14/2016  . Myalgia 11/21/2012  . Encounter for routine gynecological examination 05/22/2012  . 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 BACK PAIN 02/16/2010  . ANXIETY DEPRESSION 06/30/2008  . GANGLION CYST, WRIST, LEFT 07/25/2007    Past Surgical History:  Procedure Laterality Date  . cystic ovarian mass  01/2005  . ELBOW LIGAMENT RECONSTRUCTION      OB History    Gravida Para Term Preterm AB Living   0             SAB TAB Ectopic Multiple Live Births                   Home Medications    Prior to Admission medications   Medication Sig Start Date End Date Taking? Authorizing Provider  ALPRAZolam Prudy Feeler) 0.5 MG tablet Take 1 tablet (0.5 mg total) by mouth 2 (two) times daily as needed for anxiety. 10/09/16   Yes Tower, Audrie Gallus, MD  aspirin EC 81 MG tablet Take 162 mg by mouth once.   Yes [provider]  diphenhydrAMINE (BENADRYL) 25 MG tablet Take 50 mg by mouth every 6 (six) hours as needed for allergies.    Yes [provider]  Multiple Vitamin (MULTIVITAMIN WITH MINERALS) TABS tablet Take 1 tablet by mouth daily.   Yes [provider]  venlafaxine XR (EFFEXOR XR) 37.5 MG 24 hr capsule Take 1 capsule (37.5 mg total) by mouth daily with breakfast. 09/25/16  Yes Tower, Audrie Gallus, MD    Family History Family History  Problem Relation Age of Onset  . Hypertension Father     Social History Social History  Substance Use Topics  . Smoking status: Current Every Day Smoker    Packs/day: 0.50    Years: 7.00    Types: Cigarettes  . Smokeless tobacco: Never Used  . Alcohol use No     Allergies   Patient has no known allergies.   Review of Systems Review of Systems  Constitutional: Negative for chills, diaphoresis, fatigue and fever.  HENT: Negative for congestion, rhinorrhea and sneezing.   Eyes: Negative.   Respiratory: Negative for cough, chest tightness and shortness of breath.   Cardiovascular: Positive for chest pain. Negative for  leg swelling.  Gastrointestinal: Negative for abdominal pain, blood in stool, diarrhea, nausea and vomiting.  Genitourinary: Negative for difficulty urinating, flank pain, frequency and hematuria.  Musculoskeletal: Negative for arthralgias and back pain.  Skin: Negative for rash.  Neurological: Negative for dizziness, speech difficulty, weakness, numbness and headaches.  Psychiatric/Behavioral: The patient is nervous/anxious.      Physical Exam Updated Vital Signs BP 112/74   Pulse 64   Temp 98 F (36.7 C) (Oral)   Resp 20   Ht  (1.6 m)   Wt 68 kg (150 lb)   LMP 10/14/2016 (Exact Date)   SpO2 100%   BMI 26.57 kg/m   Physical Exam  Constitutional: She is oriented to person, place, and time. She appears  well-developed and well-nourished.  HENT:  Head: Normocephalic and atraumatic.  Eyes: Pupils are equal, round, and reactive to light.  Neck: Normal range of motion. Neck supple.  Cardiovascular: Normal rate, regular rhythm and normal heart sounds.   Pulmonary/Chest: Effort normal and breath sounds normal. No respiratory distress. She has no wheezes. She has no rales. She exhibits no tenderness.  Abdominal: Soft. Bowel sounds are normal. There is no tenderness. There is no rebound and no guarding.  Musculoskeletal: Normal range of motion. She exhibits no edema.  No edema or calf tenderness  Lymphadenopathy:    She has no cervical adenopathy.  Neurological: She is alert and oriented to person, place, and time.  Skin: Skin is warm and dry. No rash noted.  Psychiatric: She has a normal mood and affect.     ED Treatments / Results  Labs (all labs ordered are listed, but only abnormal results are displayed) Labs Reviewed  BASIC METABOLIC PANEL  CBC  I-STAT TROPONIN, ED  I-STAT TROPONIN, ED    EKG  EKG Interpretation  Date/Time:  Thursday October 19 2016 06:36:14 EDT Ventricular Rate:  63 PR Interval:    QRS Duration: 86 QT Interval:  405 QTC Calculation: 415 R Axis:   52 Text Interpretation:  Sinus rhythm Confirmed by Rochele Raring [phone]) on 10/19/2016 6:44:46 AM Also confirmed by Rochele Raring [phone]), editor Misty Stanley [phone])  on 10/19/2016 7:17:53 AM       Radiology Dg Chest 2 View  Result Date: 10/19/2016 CLINICAL DATA:  Chest pain. EXAM: CHEST  2 VIEW COMPARISON:  11/22/2012 . FINDINGS: Mediastinum and hilar structures are normal. Lungs are clear. No pleural effusion or pneumothorax. IMPRESSION: No active cardiopulmonary disease. Electronically Signed   By: Maisie Fus  Register   On: 10/19/2016 07:25    Procedures Procedures (including critical care time)  Medications Ordered in ED Medications - No data to display   Initial Impression / Assessment and  Plan / ED Course  I have reviewed the triage vital signs and the nursing notes.  Pertinent labs & imaging results that were available during my care of the patient were reviewed by me and considered in my medical decision making (see chart for details).     Patient is a 36 year old female who presents with chest pain. There is no associated symptoms. No exertional symptoms. No suggestions of ischemia on EKG. She's had 2 negative troponins. There is no other symptoms that would be more suggestive of pulmonary embolus. She has no evidence of pneumonia or pneumothorax. She's been pain-free since my evaluation. She feels that this was a panic attack which does go along with her symptoms. However I did encourage her to have follow-up with her primary care physician and return here  if she has any ongoing or worsening symptoms.  Final Clinical Impressions(s) / ED Diagnoses   Final diagnoses:  Atypical chest pain    New Prescriptions New Prescriptions   No medications on file     Rolan Bucco, MD 10/19/16 1045    Rolan Bucco, MD 10/19/16 1057

## 2016-10-23 ENCOUNTER — Encounter: Payer: Self-pay | Admitting: Family Medicine

## 2016-10-23 ENCOUNTER — Ambulatory Visit (INDEPENDENT_AMBULATORY_CARE_PROVIDER_SITE_OTHER): Payer: BLUE CROSS/BLUE SHIELD | Admitting: Family Medicine

## 2016-10-23 VITALS — BP 108/66 | HR 79 | Temp 98.1°F | Ht 63.0 in | Wt 145.2 lb

## 2016-10-23 DIAGNOSIS — F17200 Nicotine dependence, unspecified, uncomplicated: Secondary | ICD-10-CM

## 2016-10-23 DIAGNOSIS — R0789 Other chest pain: Secondary | ICD-10-CM

## 2016-10-23 DIAGNOSIS — F341 Dysthymic disorder: Secondary | ICD-10-CM | POA: Diagnosis not present

## 2016-10-23 NOTE — Progress Notes (Signed)
Subjective:    Patient ID: Shannon Dyer, female    DOB: [birth year], 36 y.o.   MRN: [id number]  HPI  Here for ED f/u  Seen for cp with no associated symptoms on 9/20   nk EKG Neg troponins  CXR clear  Pt felt it may have been a panic attack   It happened at work  Had just gotten there and started her job - a normal day/no extra stress She developed cp and a feeling of sob and then felt dizzy (they called ems)   Results for orders placed or performed during the hospital encounter of 10/19/16  Basic metabolic panel  Result Value Ref Range   Sodium 136 135 - 145 mmol/L   Potassium 3.9 3.5 - 5.1 mmol/L   Chloride 105 101 - 111 mmol/L   CO2 22 22 - 32 mmol/L   Glucose, Bld 88 65 - 99 mg/dL   BUN 9 6 - 20 mg/dL   Creatinine, Ser 7.82 0.44 - 1.00 mg/dL   Calcium 9.8 8.9 - 95.6 mg/dL   GFR calc non Af Amer >60 >60 mL/min   GFR calc Af Amer >60 >60 mL/min   Anion gap 9 5 - 15  CBC  Result Value Ref Range   WBC 4.7 4.0 - 10.5 K/uL   RBC 4.44 3.87 - 5.11 MIL/uL   Hemoglobin 13.9 12.0 - 15.0 g/dL   HCT 21.3 08.6 - 57.8 %   MCV 93.0 78.0 - 100.0 fL   MCH 31.3 26.0 - 34.0 pg   MCHC 33.7 30.0 - 36.0 g/dL   RDW 46.9 62.9 - 52.8 %   Platelets 273 150 - 400 K/uL  I-stat troponin, ED  Result Value Ref Range   Troponin i, poc 0.00 0.00 - 0.08 ng/mL   Comment 3          I-stat troponin, ED  Result Value Ref Range   Troponin i, poc 0.00 0.00 - 0.08 ng/mL   Comment 3             Dg Chest 2 View  Result Date: 10/19/2016 CLINICAL DATA:  Chest pain. EXAM: CHEST  2 VIEW COMPARISON:  11/22/2012 . FINDINGS: Mediastinum and hilar structures are normal. Lungs are clear. No pleural effusion or pneumothorax. IMPRESSION: No active cardiopulmonary disease. Electronically Signed   By: Maisie Fus  Register   On: 10/19/2016 07:25    Current smoker BP Readings from Last 3 Encounters:  10/23/16 108/66  10/19/16 112/74  09/25/16 110/64   Wt Readings from Last 3 Encounters:  10/23/16 145 lb 4  oz (65.9 kg)  10/19/16 150 lb (68 kg)  09/25/16 150 lb 8 oz (68.3 kg)   Pulse Readings from Last 3 Encounters:  10/23/16 79  10/19/16 64  09/25/16 90     Has appt with psychiatry   Last chest discomfort was sat  Arms "went numb" Sunday and she had the shakes  Some headaches - almost daily   She feels like the effexor makes her worse in generally   Stress level at home is somewhat better - still some bad days   No uri symptoms  Has cut down her smoking since the visit- is less than 1/2 ppd  No n/v or heartburn   Patient Active Problem List   Diagnosis Date Noted  . Chest pain, atypical 10/23/2016  . Smoker 08/11/2016  . 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 Dyspepsia 02/14/2016  . Myalgia 11/21/2012  . Encounter for routine gynecological  examination 05/22/2012  . 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 BACK PAIN 02/16/2010  . ANXIETY DEPRESSION 06/30/2008  . GANGLION CYST, WRIST, LEFT 07/25/2007   Past Medical History:  Diagnosis Date  . Tobacco abuse    Past Surgical History:  Procedure Laterality Date  . cystic ovarian mass  01/2005  . ELBOW LIGAMENT RECONSTRUCTION     Social History  Substance Use Topics  . Smoking status: Current Every Day Smoker    Packs/day: 0.50    Years: 7.00    Types: Cigarettes  . Smokeless tobacco: Never Used  . Alcohol use No   Family History  Problem Relation Age of Onset  . Hypertension Father    No Known Allergies Current Outpatient Prescriptions on File Prior to Visit  Medication Sig Dispense Refill  . ALPRAZolam (XANAX) 0.5 MG tablet Take 1 tablet (0.5 mg total) by mouth 2 (two) times daily as needed for anxiety. 60 tablet 0  . aspirin EC 81 MG tablet Take 162 mg by mouth once.    . diphenhydrAMINE (BENADRYL) 25 MG tablet Take 50 mg by mouth every 6 (six) hours as needed for allergies.     . Multiple Vitamin (MULTIVITAMIN WITH MINERALS) TABS tablet Take 1 tablet by mouth daily.    Marland Kitchen venlafaxine XR (EFFEXOR XR) 37.5 MG  24 hr capsule Take 1 capsule (37.5 mg total) by mouth daily with breakfast. 30 capsule 3   No current facility-administered medications on file prior to visit.     Review of Systems  Constitutional: Positive for fatigue. Negative for activity change, appetite change, fever and unexpected weight change.  HENT: Negative for congestion, ear pain, rhinorrhea, sinus pressure and sore throat.   Eyes: Negative for pain, redness and visual disturbance.  Respiratory: Negative for cough, shortness of breath and wheezing.   Cardiovascular: Negative for chest pain and palpitations.  Gastrointestinal: Negative for abdominal pain, blood in stool, constipation and diarrhea.  Endocrine: Negative for polydipsia and polyuria.  Genitourinary: Negative for dysuria, frequency and urgency.  Musculoskeletal: Negative for arthralgias, back pain and myalgias.  Skin: Negative for pallor and rash.  Allergic/Immunologic: Negative for environmental allergies.  Neurological: Positive for numbness and headaches. Negative for dizziness, seizures, syncope, facial asymmetry, speech difficulty, weakness and light-headedness.  Hematological: Negative for adenopathy. Does not bruise/bleed easily.  Psychiatric/Behavioral: Positive for dysphoric mood. Negative for confusion, decreased concentration, self-injury and suicidal ideas. The patient is nervous/anxious.        Objective:   Physical Exam  Constitutional: She appears well-developed and well-nourished. No distress.  Well appearing   HENT:  Head: Normocephalic and atraumatic.  Mouth/Throat: Oropharynx is clear and moist.  Eyes: Pupils are equal, round, and reactive to light. Conjunctivae and EOM are normal.  Neck: Normal range of motion. Neck supple. No JVD present. Carotid bruit is not present. No thyromegaly present.  Cardiovascular: Normal rate, regular rhythm, normal heart sounds and intact distal pulses.  Exam reveals no gallop.   Pulmonary/Chest: Effort normal  and breath sounds normal. No respiratory distress. She has no wheezes. She has no rales. She exhibits no tenderness.  No crackles  Good air exch No wheeze   Abdominal: Soft. Bowel sounds are normal. She exhibits no distension, no abdominal bruit and no mass. There is no tenderness.  Musculoskeletal: She exhibits no edema.  Lymphadenopathy:    She has no cervical adenopathy.  Neurological: She is alert. She has normal reflexes. She displays tremor. No cranial nerve deficit. She exhibits normal muscle tone. Coordination  normal.  Mild anxious hand tremor   Skin: Skin is warm and dry. No rash noted. No pallor.  Psychiatric: Her speech is normal and behavior is normal. Thought content normal. Her mood appears anxious. Her affect is blunt. Her affect is not labile. Thought content is not paranoid. Cognition and memory are normal. She exhibits a depressed mood. She expresses no homicidal and no suicidal ideation.  Affect is more blunted today  Pleasant however Not tearful or inappropriate             Assessment & Plan:   Problem List Items Addressed This Visit      Other   ANXIETY DEPRESSION - Primary    Rev last note - pt has psychiatric appt in oct  Thinks effexor is making her worse instead of better (panic attack. Milinda Hirschfeld) Will cut to 1 pill every other day (37.5) for at least a week before stopping  Discussed what side effects to expect coming off of this incl anx/mental fogginess/brain zaps Plans on return to work wed if she feels better        Chest pain, atypical    Reviewed history with pt Reviewed hospital records, lab results and studies in detail   Cp is resolved now  In retrospect she feels it was a panic attack Reassuring exam today  Pt thinks effexor may make her worse- will go ahead and start weaning it  For psychiatric f/u in oct  Disc s/s to watch out for       Smoker    Commended on cutting down to less than 1/2 ppd  She will continue to work on it    Disc in detail risks of smoking and possible outcomes including copd, vascular/ heart disease, cancer , respiratory and sinus infections  Pt voices understanding

## 2016-10-23 NOTE — Patient Instructions (Signed)
I think your chest symptoms may be due to panic/anxiety  ? If effexor is causing headaches or other side effects   Keep appt for psychiatry  Wean the effexor to 1 pill every other day  You may have some weird symptoms coming off of it ( brain zaps/ fogginess)  When you think you are doing better - go ahead and stop it (give it at least a week)   Stay active  Talk to people  Keep [place] posted regarding your symptoms  ER work up is very re assuring   Go back to work when you feel better

## 2016-10-23 NOTE — Assessment & Plan Note (Signed)
Rev last note - pt has psychiatric appt in oct  Thinks effexor is making her worse instead of better (panic attack. Milinda Hirschfeld) Will cut to 1 pill every other day (37.5) for at least a week before stopping  Discussed what side effects to expect coming off of this incl anx/mental fogginess/brain zaps Plans on return to work wed if she feels better

## 2016-10-23 NOTE — Assessment & Plan Note (Signed)
Commended on cutting down to less than 1/2 ppd  She will continue to work on it  Disc in detail risks of smoking and possible outcomes including copd, vascular/ heart disease, cancer , respiratory and sinus infections  Pt voices understanding

## 2016-10-23 NOTE — Assessment & Plan Note (Signed)
Reviewed history with pt Reviewed hospital records, lab results and studies in detail   Cp is resolved now  In retrospect she feels it was a panic attack Reassuring exam today  Pt thinks effexor may make her worse- will go ahead and start weaning it  For psychiatric f/u in oct  Disc s/s to watch out for

## 2016-11-08 ENCOUNTER — Other Ambulatory Visit: Payer: Self-pay | Admitting: Family Medicine

## 2016-11-08 NOTE — Telephone Encounter (Signed)
Pt left v/m requesting refill alprazolam to [facility]; pt wants to know since not taking other anxiety med pt wants to know if alprazolam 0.5 mg instructions changed to one tab tid prn for anxiety. Pt request cb. If cannot increase instructions pt request refill with bid prn instructions. Pt request cb. Last seen 10/23/16. Last refilled # 60 on 10/09/16.

## 2016-11-08 NOTE — Telephone Encounter (Signed)
1st appt is 11/20/16

## 2016-11-08 NOTE — Telephone Encounter (Signed)
Before I refill please ask when her first psychiatric appt is ? Thanks

## 2016-11-09 NOTE — Telephone Encounter (Signed)
Rx called in as prescribed 

## 2016-11-09 NOTE — Telephone Encounter (Signed)
Px written for call in   

## 2016-12-25 ENCOUNTER — Encounter: Payer: Self-pay | Admitting: Family Medicine

## 2016-12-25 ENCOUNTER — Ambulatory Visit (INDEPENDENT_AMBULATORY_CARE_PROVIDER_SITE_OTHER): Payer: BLUE CROSS/BLUE SHIELD | Admitting: Family Medicine

## 2016-12-25 ENCOUNTER — Ambulatory Visit: Payer: Self-pay | Admitting: *Deleted

## 2016-12-25 VITALS — BP 102/70 | HR 59 | Temp 98.5°F | Wt 143.0 lb

## 2016-12-25 DIAGNOSIS — S76311A Strain of muscle, fascia and tendon of the posterior muscle group at thigh level, right thigh, initial encounter: Secondary | ICD-10-CM

## 2016-12-25 MED ORDER — MELOXICAM 15 MG PO TABS: 15.0000 mg | ORAL_TABLET | Freq: Every day | ORAL | 0 refills | Status: DC

## 2016-12-25 NOTE — Progress Notes (Signed)
Subjective:     Patient ID: Shannon BarthelChristine Solinger, female   DOB: December 18, 1980, 36 y.o.   MRN: [id number]  HPI Patient seen with pain along her right lower hamstring region posteriorly which started Saturday after she fell. She states she was mopping floor at home and slipped around 10 PM and basically went down as if she were doing a "split". She did not feel any tearing or popping sensation. She did not actually hit the floor. She's had some pain right hamstring region since then worse with ambulation. Denies any low back pain. No numbness. No weakness. She took some Tylenol without much improvement. Pain is moderate. Has not seen any bruising. No knee effusion  Past Medical History:  Diagnosis Date  . Tobacco abuse    Past Surgical History:  Procedure Laterality Date  . cystic ovarian mass  01/2005  . ELBOW LIGAMENT RECONSTRUCTION      reports that she has been smoking cigarettes.  She has a 3.50 pack-year smoking history. she has never used smokeless tobacco. She reports that she does not drink alcohol or use drugs. family history includes Hypertension in her father. No Known Allergies   Review of Systems  Musculoskeletal: Negative for back pain.  Neurological: Negative for weakness and numbness.       Objective:   Physical Exam  Constitutional: She appears well-developed and well-nourished.  Cardiovascular: Normal rate and regular rhythm.  Pulmonary/Chest: Effort normal and breath sounds normal. No respiratory distress. She has no wheezes. She has no rales.  Musculoskeletal: She exhibits no edema.  Right lower extremity reveals no edema. No erythema. No warmth. No knee effusion. Full range of motion right knee. She has good range of motion right hip. She has some pain with leg flexion against resistance in the lower hamstring region. No visible bruising.  Neurological:  Symmetric lower extremity reflexes. Full-strength with plantar flexion and dorsiflexion bilaterally        Assessment:     Right lower extremity pain. Suspect hamstring tendon strain    Plan:     -Short-term trial of meloxicam 15 mg once daily -She will try some topical heat for symptom relief and gentle stretches as tolerated -Follow-up with primary in 2 weeks if not further improving  Kristian CoveyBruce W Morna Flud MD [facility]

## 2016-12-25 NOTE — Patient Instructions (Signed)
Hamstring Strain A hamstring strain is an injury that occurs when the hamstring muscles are overstretched or overloaded. The hamstring muscles are a group of muscles at the back of the thighs. These muscles are used in straightening the hips, bending the knees, and pulling back the legs. This type of injury is often called a pulled hamstring muscle. The severity of a muscle strain is rated in degrees. First-degree strains have the least amount of muscle fiber tearing and pain. Second-degree and third-degree strains have increasingly more tearing and pain. What are the causes? Hamstring strains occur when a sudden, violent force is placed on these muscles and stretches them too far. This often occurs during activities that involve running, jumping, kicking, or weight lifting. What increases the risk? Hamstring strains are especially common in athletes. Other things that can increase your risk for this injury include:  Having low strength, endurance, or flexibility of the hamstring muscles.  Performing high-impact physical activity.  Having poor physical fitness.  Having a previous leg injury.  Having fatigued muscles.  Older age.  What are the signs or symptoms?  Pain in the back of the thigh.  Bruising.  Swelling.  Muscle spasm.  Difficulty using the muscle because of pain or lack of normal function. For severe strains, you may have a popping or snapping feeling when the injury occurs. How is this diagnosed? Your health care provider will perform a physical exam and ask about your medical history. How is this treated? Often, the best treatment for a hamstring strain is protecting, resting, icing, applying compression, and elevating the injured area. This is referred to as the PRICE method of treatment. Your health care provider may also recommend medicines to help reduce pain or inflammation. Follow these instructions at home:  Use the PRICE method of treatment to promote muscle  healing during the first 2-3 days after your injury. The PRICE method involves: ? P-Protecting the muscle from being injured again. ? R-Restricting your activity and resting the injured body part. ? I-Icing your injury. To do this, put ice in a plastic bag. Place a towel between your skin and the bag. Then, apply the ice and leave it on for 20 minutes, 2-3 times per day. After the third day, switch to moist heat packs. ? C-Applying compression to the injured area with an elastic bandage. Be careful not to wrap it too tightly. That may interfere with blood circulation or may increase swelling. ? E-Elevating the injured body part above the level of your heart as often as you can. You can do this by putting a pillow under your thigh when you sit or lie down.  Take medicines only as directed by your health care provider.  Begin exercising or stretching as directed by your health care provider.  Do not return to full activity level until your health care provider approves.  Keep all follow-up visits as directed by your health care provider. This is important. Contact a health care provider if:  You have increasing pain or swelling in the injured area.  You have numbness, tingling, or a significant loss of strength in the injured area.  Your foot or your toes become cold or turn blue. This information is not intended to replace advice given to you by your health care provider. Make sure you discuss any questions you have with your health care provider. Document Released: 10/11/2000 Document Revised: 06/24/2015 Document Reviewed: 09/01/2013 Elsevier Interactive Patient Education  2018 [employer]vier Inc.  

## 2016-12-25 NOTE — Telephone Encounter (Signed)
Pt c/o pain in her right leg that has gotten worse over this day. She has taken med for it (she thinks it was Ibuprofen). That did not help her. 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 Now having trouble ambulating. Requesting an appt for today, has to work tomorrow. Care advice given to patient.  Reason for Disposition . [1] MODERATE pain (e.g., interferes with normal activities, limping) AND [2] present > 3 days  Answer Assessment - Initial Assessment Questions 1. ONSET: "When did the pain start?"      yesterday 2. LOCATION: "Where is the pain located?"      Right leg pain 3. PAIN: "How bad is the pain?"    (Scale 1-10; or mild, moderate, severe)   -  MILD (1-3): doesn't interfere with normal activities    -  MODERATE (4-7): interferes with normal activities (e.g., work or school) or awakens from sleep, limping    -  SEVERE (8-10): excruciating pain, unable to do any normal activities, unable to walk     8 4. WORK OR EXERCISE: "Has there been any recent work or exercise that involved this part of the body?"      no 5. CAUSE: "What do you think is causing the leg pain?"     no 6. OTHER SYMPTOMS: "Do you have any other symptoms?" (e.g., chest pain, back pain, breathing difficulty, swelling, rash, fever, numbness, weakness)     no 7. PREGNANCY: "Is there any chance you are pregnant?" "When was your last menstrual period?"     No, LMP 11-14th of November  Protocols used: LEG PAIN-A-AH

## 2016-12-25 NOTE — Telephone Encounter (Signed)
Pt is on schedule to see Dr. Caryl NeverBurchette today here in our office.

## 2017-01-21 ENCOUNTER — Other Ambulatory Visit: Payer: Self-pay | Admitting: Family Medicine

## 2017-02-22 ENCOUNTER — Ambulatory Visit: Payer: Self-pay

## 2017-02-22 NOTE — Telephone Encounter (Signed)
  Reason for Disposition . [1] SEVERE diarrhea (e.g., 7 or more times / day more than normal) AND [2] present > 24 hours (1 day)  Answer Assessment - Initial Assessment Questions 1. DIARRHEA SEVERITY: "How bad is the diarrhea?" "How many extra stools have you had in the past 24 hours than normal?"    - MILD: Few loose or mushy BMs; increase of 1-3 stools over normal daily number of stools; mild increase in ostomy output.   - MODERATE: Increase of 4-6 stools daily over normal; moderate increase in ostomy output.   - SEVERE (or Worst Possible): Increase of 7 or more stools daily over normal; moderate increase in ostomy output; incontinence.     6-7 2. ONSET: "When did the diarrhea begin?"      Started yesterday 3. BM CONSISTENCY: "How loose or watery is the diarrhea?"      Watery 4. VOMITING: "Are you also vomiting?" If so, ask: "How many times in the past 24 hours?"      Vomited yesterday 5. ABDOMINAL PAIN: "Are you having any abdominal pain?" If yes: "What does it feel like?" (e.g., crampy, dull, intermittent, constant)      No 6. ABDOMINAL PAIN SEVERITY: If present, ask: "How bad is the pain?"  (e.g., Scale 1-10; mild, moderate, or severe)    - MILD (1-3): doesn't interfere with normal activities, abdomen soft and not tender to touch     - MODERATE (4-7): interferes with normal activities or awakens from sleep, tender to touch     - SEVERE (8-10): excruciating pain, doubled over, unable to do any normal activities       No pain 7. ORAL INTAKE: If vomiting, "Have you been able to drink liquids?" "How much fluids have you had in the past 24 hours?"     Drinking liquids 8. HYDRATION: "Any signs of dehydration?" (e.g., dry mouth [not just dry lips], too weak to stand, dizziness, new weight loss) "When did you last urinate?"     No signs 9. EXPOSURE: "Have you traveled to a foreign country recently?" "Have you been exposed to anyone with diarrhea?" "Could you have eaten any food that was  spoiled?"     No 10. OTHER SYMPTOMS: "Do you have any other symptoms?" (e.g., fever, blood in stool)       Maybe has a fever 11. PREGNANCY: "Is there any chance you are pregnant?" "When was your last menstrual period?"       No  Protocols used: DIARRHEA-A-AH States can't stay at work with diarrhea.

## 2017-02-23 ENCOUNTER — Encounter: Payer: Self-pay | Admitting: Family Medicine

## 2017-02-23 ENCOUNTER — Ambulatory Visit (INDEPENDENT_AMBULATORY_CARE_PROVIDER_SITE_OTHER): Payer: Managed Care, Other (non HMO) | Admitting: Family Medicine

## 2017-02-23 VITALS — BP 116/68 | HR 74 | Temp 98.0°F | Wt 143.2 lb

## 2017-02-23 DIAGNOSIS — A084 Viral intestinal infection, unspecified: Secondary | ICD-10-CM | POA: Diagnosis not present

## 2017-02-23 NOTE — Progress Notes (Signed)
   Subjective:    Patient ID: Shannon Dyer, female    DOB: 1980-09-22, 37 y.o.   MRN: [id number]  HPI This is a 37 yo female who presents today with diarrhea and vomiting. Two days ago had diarrhea and vomiting, yesterday had diarrhea only. Brown, watery, no blood or mucus. No abdominal pain. Subjective fever. No dining out or unusual foods. Has been able to keep liquids down, has not had solid food. Has been having gatorade, chicken broth.    Past Medical History:  Diagnosis Date  . Tobacco abuse    Past Surgical History:  Procedure Laterality Date  . cystic ovarian mass  01/2005  . ELBOW LIGAMENT RECONSTRUCTION     Family History  Problem Relation Age of Onset  . Hypertension Father    Social History   Tobacco Use  . Smoking status: Current Every Day Smoker    Packs/day: 0.50    Years: 7.00    Pack years: 3.50    Types: Cigarettes  . Smokeless tobacco: Never Used  Substance Use Topics  . Alcohol use: No    Alcohol/week: 0.0 oz  . Drug use: No      Review of Systems Per HPI    Objective:   Physical Exam  Constitutional: She is oriented to person, place, and time. She appears well-developed and well-nourished.  HENT:  Head: Normocephalic and atraumatic.  Eyes: Conjunctivae are normal.  Cardiovascular: Normal rate, regular rhythm and normal heart sounds.  Pulmonary/Chest: Effort normal and breath sounds normal.  Abdominal: Soft. Bowel sounds are normal. She exhibits no distension. There is tenderness (mild upper left quad). There is no rebound and no guarding.  Neurological: She is alert and oriented to person, place, and time.  Skin: Skin is warm and dry.  Psychiatric: She has a normal mood and affect. Her behavior is normal. Judgment and thought content normal.  Vitals reviewed.     BP 116/68   Pulse 74   Temp 98 F (36.7 C) (Oral)   Wt 143 lb 4 oz (65 kg)   LMP 02/12/2017   SpO2 99%   BMI 25.38 kg/m  Wt Readings from Last 3 Encounters:    02/23/17 143 lb 4 oz (65 kg)  12/25/16 143 lb (64.9 kg)  10/23/16 145 lb 4 oz (65.9 kg)       Assessment & Plan:  1. Viral gastroenteritis -Provided written and verbal information regarding diagnosis and treatment. - RTC precautions and diet advancement reviewed - out of work note provided   Olean Reeeborah Gessner, FNP-BC  [facility]  02/23/2017 11:20 AM

## 2017-02-23 NOTE — Patient Instructions (Signed)

## 2017-02-26 ENCOUNTER — Telehealth: Payer: Self-pay

## 2017-02-26 NOTE — Telephone Encounter (Signed)
Pt was seen 02/23/17.

## 2017-02-26 NOTE — Telephone Encounter (Signed)
Copied from CRM [id number]. Topic: Inquiry >> Feb 26, 2017  4:26 PM Terisa Starraylor, Brittany L wrote: Patient states she is still having loose bowels that she came in for on Friday. She wants to know if she can be written out of work longer. Please call patient at [phone]

## 2017-02-26 NOTE — Telephone Encounter (Signed)
If she is still having symptoms, she needs to be seen. She can have a note out of work today.

## 2017-02-26 NOTE — Telephone Encounter (Signed)
Patient notified as instructed by telephone and verbalized understanding. Patient stated that she has already scheduled an appointment tomorrow with Dr. Ermalene SearingBedsole.  Advised Deboraha Sprangebbie Gessner NP of appointment tomorrow and was advised that she can get a work note when she sees Dr. Ermalene SearingBedsole.

## 2017-02-27 ENCOUNTER — Encounter: Payer: Self-pay | Admitting: Family Medicine

## 2017-02-27 ENCOUNTER — Ambulatory Visit (INDEPENDENT_AMBULATORY_CARE_PROVIDER_SITE_OTHER): Payer: Managed Care, Other (non HMO) | Admitting: Family Medicine

## 2017-02-27 ENCOUNTER — Other Ambulatory Visit: Payer: Self-pay

## 2017-02-27 VITALS — BP 102/78 | HR 100 | Temp 98.7°F | Ht 63.0 in | Wt 146.2 lb

## 2017-02-27 DIAGNOSIS — A084 Viral intestinal infection, unspecified: Secondary | ICD-10-CM

## 2017-02-27 DIAGNOSIS — E860 Dehydration: Secondary | ICD-10-CM | POA: Insufficient documentation

## 2017-02-27 MED ORDER — ONDANSETRON HCL 8 MG PO TABS: 8.0000 mg | ORAL_TABLET | Freq: Three times a day (TID) | ORAL | 0 refills | Status: DC | PRN

## 2017-02-27 NOTE — Progress Notes (Signed)
Subjective:    Patient ID: Shannon Dyer, female    DOB: [birth year], 37 y.o.   MRN: [id number]  HPI  37 year old female pt of Dr. Royden Purlower's presents for continued  nausea and diarrhea.   She was seen on 1/25 by  D. Leone PayorGessner, NP for same symptoms.. Started with emesis then moved to diarrhea. Brown, watery, no blood or mucus. No abdominal pain. Subjective fever. No dining out or unusual foods.   She reprots today that  Continued diarrhea. 2 BMs a day, watery. Continue nausea and emesis off and on.  Dry mouth. She is keeping up with fluids. 16 oz in last 12 hours.  She has been tired. She feel lightheaded. She does have some epigastric pain. Low grade temp at 99.5. Cold chills.   Using tylenol.  No blood in stool.  nml UOP. Sick contact at work.   Hx:   No GI history.  Blood pressure 102/78, pulse 100, temperature 98.7 F (37.1 C), temperature source Oral, height 5\' 3"  (1.6 m), weight 146 lb 4 oz (66.3 kg), last menstrual period 02/12/2017. Wt Readings from Last 3 Encounters:  02/27/17 146 lb 4 oz (66.3 kg)  02/23/17 143 lb 4 oz (65 kg)  12/25/16 143 lb (64.9 kg)       Review of Systems  Constitutional: Negative for fatigue and fever.  HENT: Negative for congestion.   Eyes: Negative for pain.  Respiratory: Negative for cough and shortness of breath.   Cardiovascular: Negative for chest pain, palpitations and leg swelling.  Gastrointestinal: Negative for abdominal pain.  Genitourinary: Negative for dysuria and vaginal bleeding.  Musculoskeletal: Negative for back pain.  Neurological: Negative for syncope, light-headedness and headaches.  Psychiatric/Behavioral: Negative for dysphoric mood.       Objective:   Physical Exam  Constitutional: Vital signs are normal. She appears well-developed and well-nourished. She is cooperative.  Non-toxic appearance. She does not appear ill. No distress.  HENT:  Head: Normocephalic.  Right Ear: Hearing, tympanic membrane, external  ear and ear canal normal. Tympanic membrane is not erythematous, not retracted and not bulging.  Left Ear: Hearing, tympanic membrane, external ear and ear canal normal. Tympanic membrane is not erythematous, not retracted and not bulging.  Nose: No mucosal edema or rhinorrhea. Right sinus exhibits no maxillary sinus tenderness and no frontal sinus tenderness. Left sinus exhibits no maxillary sinus tenderness and no frontal sinus tenderness.  Mouth/Throat: Uvula is midline, oropharynx is clear and moist and mucous membranes are normal.  Eyes: Conjunctivae, EOM and lids are normal. Pupils are equal, round, and reactive to light. Lids are everted and swept, no foreign bodies found.  Neck: Trachea normal and normal range of motion. Neck supple. Carotid bruit is not present. No thyroid mass and no thyromegaly present.  Cardiovascular: Regular rhythm, S1 normal, S2 normal, normal heart sounds, intact distal pulses and normal pulses. Tachycardia present. Exam reveals no gallop and no friction rub.  No murmur heard. Pulmonary/Chest: Effort normal and breath sounds normal. No tachypnea. No respiratory distress. She has no decreased breath sounds. She has no wheezes. She has no rhonchi. She has no rales.  Abdominal: Soft. Normal appearance and bowel sounds are normal. There is no hepatosplenomegaly. There is tenderness in the epigastric area. There is no rigidity, no rebound, no guarding and no CVA tenderness.  Neurological: She is alert.  Skin: Skin is warm, dry and intact. No rash noted.  Psychiatric: Her speech is normal and behavior is normal. Judgment and thought  content normal. Her mood appears not anxious. Cognition and memory are normal. She does not exhibit a depressed mood.          Assessment & Plan:

## 2017-02-27 NOTE — Assessment & Plan Note (Signed)
Discussed rehydration.

## 2017-02-27 NOTE — Patient Instructions (Addendum)
Start zofran as needed for nausea.  Push fluids  (gatorade), try to increase in the next 12 hours...  If cannot keep down liquids in next 12 hours or no urine output.. Go to ER.  Call if not improving as expect.

## 2017-02-27 NOTE — Assessment & Plan Note (Signed)
Encouraged pt to increase hydration.. Will given zofran as needed.  If not able to keep liquids down.. Go to ER for IV Fluids  if not improving by end of week we can eval with GE panel.

## 2017-03-07 ENCOUNTER — Ambulatory Visit: Payer: Self-pay | Admitting: *Deleted

## 2017-03-07 NOTE — Telephone Encounter (Signed)
Glad she is feeling better  Keep us posted  I think she sees psychiatry now and possibly counseling-continue f/u

## 2017-03-07 NOTE — Telephone Encounter (Signed)
Patient is calling to let her provider know that she had a couple of episodes- but she handled them and worked through them. She has notified her therapists and made them aware as well. She is trying to make sure that all her providers are aware so that if anything worse so happen- they are up to date. Praised and encouraged patient on how she was able to handle her panic attack and breathe through it to calm herself and use her coping skills. She is aware of the numbers to call in an emergency and she has good support. Answer Assessment - Initial Assessment Questions 1. CONCERN: "What happened that made you call today?"     Patient is calling to report that she had panic attack today- she had chest pains yesterday 2. ANXIETY SYMPTOM SCREENING: "Can you describe how you have been feeling?"  (e.g., tense, restless, panicky, anxious, keyed up, trouble sleeping, trouble concentrating)     Chest pains yesterday- patient thinks she may have stopped the panic attack. Panic attack today happened out of nowhere. Stress level is high at this time 3. ONSET: "How long have you been feeling this way?"     Patient is feeling good now 4. RECURRENT: "Have you felt this way before?"  If yes: "What happened that time?" "What helped these feelings go away in the past?"      Yes- patient has history of panic attack 5. RISK OF HARM - SUICIDAL IDEATION:  "Do you ever have thoughts of hurting or killing yourself?"  (e.g., yes, no, no but preoccupation with thoughts about death) no   - INTENT:  "Do you have thoughts of hurting or killing yourself right NOW?" (e.g., yes, no, N/A) no   - PLAN: "Do you have a specific plan for how you would do this?" (e.g., gun, knife, overdose, no plan, N/A)     no 6. RISK OF HARM - HOMICIDAL IDEATION:  "Do you ever have thoughts of hurting or killing someone else?"  (e.g., yes, no, no but preoccupation with thoughts about death) no   - INTENT:  "Do you have thoughts of hurting or killing  someone right NOW?" (e.g., yes, no, N/A) no   - PLAN: "Do you have a specific plan for how you would do this?" (e.g., gun, knife, no plan, N/A) no      7. FUNCTIONAL IMPAIRMENT: "How have things been going for you overall in your life? Have you had any more difficulties than usual doing your normal daily activities?"  (e.g., better, same, worse; self-care, school, work, interactions)  Patient is going through divorce and her stress level is high- patient is doing normal activities       8. SUPPORT: "Who is with you now?" "Who do you live with?" "Do you have family or friends nearby who you can talk to?"      Friends, mom 9. THERAPIST: "Do you have a counselor or therapist? Name?"     Yes- patient has both 10. STRESSORS: "Has there been any new stress or recent changes in your life?"       Divorce   39. CAFFEINE ABUSE: "Do you drink caffeinated beverages, and how much each day?" (e.g., coffee, tea, colas)       Patient is trying to replace fluids now 12. SUBSTANCE ABUSE: "Do you use any illegal drugs or alcohol?"       avoids 13. OTHER SYMPTOMS: "Do you have any other physical symptoms right now?" (e.g., chest pain, palpitations, difficulty  breathing, fever)       Patient is using coping skills 14. PREGNANCY: "Is there any chance you are pregnant?" "When was your last menstrual period?"       n/a  Protocols used: ANXIETY AND PANIC ATTACK-A-AH

## 2017-03-13 ENCOUNTER — Encounter (HOSPITAL_COMMUNITY): Payer: Self-pay | Admitting: Emergency Medicine

## 2017-03-13 ENCOUNTER — Emergency Department (HOSPITAL_COMMUNITY): Payer: Managed Care, Other (non HMO)

## 2017-03-13 ENCOUNTER — Other Ambulatory Visit: Payer: Self-pay

## 2017-03-13 ENCOUNTER — Emergency Department (HOSPITAL_COMMUNITY)
Admission: EM | Admit: 2017-03-13 | Discharge: 2017-03-13 | Disposition: A | Discharge status: Home / Self Care | Payer: Managed Care, Other (non HMO) | Attending: Emergency Medicine | Admitting: Emergency Medicine

## 2017-03-13 ENCOUNTER — Ambulatory Visit: Payer: Self-pay | Admitting: *Deleted

## 2017-03-13 DIAGNOSIS — R202 Paresthesia of skin: Secondary | ICD-10-CM | POA: Diagnosis not present

## 2017-03-13 DIAGNOSIS — F17210 Nicotine dependence, cigarettes, uncomplicated: Secondary | ICD-10-CM | POA: Insufficient documentation

## 2017-03-13 DIAGNOSIS — R0789 Other chest pain: Secondary | ICD-10-CM | POA: Insufficient documentation

## 2017-03-13 DIAGNOSIS — Z79899 Other long term (current) drug therapy: Secondary | ICD-10-CM | POA: Diagnosis not present

## 2017-03-13 HISTORY — DX: Panic disorder (episodic paroxysmal anxiety): F41.0

## 2017-03-13 LAB — BASIC METABOLIC PANEL
Anion gap: 11 (ref 5–15)
BUN: 5 mg/dL — AB (ref 6–20)
CO2: 24 mmol/L (ref 22–32)
Calcium: 9.7 mg/dL (ref 8.9–10.3)
Chloride: 101 mmol/L (ref 101–111)
Creatinine, Ser: 0.98 mg/dL (ref 0.44–1.00)
GFR calc Af Amer: >60 mL/min (ref >60)
GFR calc non Af Amer: >60 mL/min (ref >60)
Glucose, Bld: 86 mg/dL (ref 65–99)
POTASSIUM: 4.9 mmol/L (ref 3.5–5.1)
SODIUM: 136 mmol/L (ref 135–145)

## 2017-03-13 LAB — CBC
HEMATOCRIT: 45.6 % (ref 36.0–46.0)
Hemoglobin: 16 g/dL — ABNORMAL HIGH (ref 12.0–15.0)
MCH: 33.5 pg (ref 26.0–34.0)
MCHC: 35.1 g/dL (ref 30.0–36.0)
MCV: 95.4 fL (ref 78.0–100.0)
Platelets: 281 10*3/uL (ref 150–400)
RBC: 4.78 MIL/uL (ref 3.87–5.11)
RDW: 12.7 % (ref 11.5–15.5)
WBC: 5.8 10*3/uL (ref 4.0–10.5)

## 2017-03-13 LAB — I-STAT TROPONIN, ED
Troponin i, poc: 0 ng/mL (ref 0.00–0.08)
Troponin i, poc: 0 ng/mL (ref 0.00–0.08)

## 2017-03-13 LAB — I-STAT BETA HCG BLOOD, ED (MC, WL, AP ONLY)

## 2017-03-13 LAB — MAGNESIUM: MAGNESIUM: 1.9 mg/dL (ref 1.7–2.4)

## 2017-03-13 NOTE — Discharge Instructions (Addendum)
Your lab results were encouraging.  Orders have been placed for outpatient MRI. Please call the imaging center to set up an appointment to have this imaging performed.  You have an appointment scheduled with [facility] on March 6 at 9:45 AM.  You may also call [facility] to see if they can see you earlier.

## 2017-03-13 NOTE — ED Triage Notes (Addendum)
Numbness in both lower legs goes to toes she states  started this am about 6 am, was at work  She drove from here from work, she can walk, now states she having cp and left arm tightness that just started while sitting in waiting room states has hx of panic attacks,  Has had some nausea , Zenaida Niecevan is neg,, no slurred speech has no drift in arms or legs

## 2017-03-13 NOTE — Telephone Encounter (Signed)
Looks like she is in ED now.  

## 2017-03-13 NOTE — Telephone Encounter (Signed)
Pt reports "tingling sensation from both knees to feet", constant, onset this morning at 6am. States when she sits, "tingling sensation moves to entire body and toes and feet go numb." Reports nausea this am. States she had a "panic attack" last Wednesday; does not feel anxious presently. HR during call 132. During call pt states "now my left arm feels tight."  Directed to ED. Pt states she is able to drive; cautioned she should have someone drive, call [id number], she declined. Reason for Disposition . [1] Tingling (e.g., pins and needles) of the face, arm / hand, or leg / foot on one side of the body AND [2] present now  Answer Assessment - Initial Assessment Questions 1. SYMPTOM: "What is the main symptom you are concerned about?" (e.g., weakness, numbness)    Tingling and numbness sensation, constant knees to feet bilaterally, then "entire body when sitting." Also states when sitting, "toes and feet go numb." 2. ONSET: "When did this start?" (minutes, hours, days; while sleeping)     This AM, "about 6am. 3. LAST NORMAL: "When was the last time you were normal (no symptoms)?"     6am 4. PATTERN "Does this come and go, or has it been constant since it started?"  "Is it present now?"     Constant, numbness worse when sitting, goes down from knees to feet, tingling "pretty much everywhere." 5. CARDIAC SYMPTOMS: "Have you had any of the following symptoms: chest pain, difficulty breathing, palpitations?"     No. HR during call 132 6. NEUROLOGIC SYMPTOMS: "Have you had any of the following symptoms: headache, dizziness, vision loss, double vision, changes in speech, unsteady on your feet?"     Nausea this AM 7. OTHER SYMPTOMS: "Do you have any other symptoms?"     HR 132; during call pt stated left arm felt "tight."  8. PREGNANCY: "Is there any chance you are pregnant?" "When was your last menstrual period?"     No  Protocols used: NEUROLOGIC DEFICIT-A-AH

## 2017-03-13 NOTE — ED Notes (Signed)
Patient transported to X-ray 

## 2017-03-13 NOTE — ED Provider Notes (Signed)
[facility] Provider Note   CSN: [id number] Arrival date & time: 03/13/17  1110     History   Chief Complaint Chief Complaint  Patient presents with  . Numbness  . Chest Pain    HPI Shannon Dyer is a 37 y.o. female.  HPI   Shannon Dyer is a 37 y.o. female, with a history of panic attacks, presenting to the ED with tingling in the bilateral lower extremities that began while she was walking at around 5:30 AM this morning.  States this sensation has persisted.  She has not experienced this sensation before.  She then started to feel tingling throughout her body and momentary "tightness" in the left arm.  She did mention she had chest pain last week and this is being addressed by her PCP.  She had an instance of chest pain in the waiting room that was sharp, left chest, lasted a few seconds, seemed to be associated with feelings of anxiety, resolved, and has not recurred.  Denies history of IV drug use or HIV.  Denies recent febrile illnesses. She denies fever/chills, abdominal pain, back pain, falls/trauma, weakness, changes in bowel or bladder function, lower extremity pain, N/V/C/D, or any other complaints.      Past Medical History:  Diagnosis Date  . Panic attacks   . Tobacco abuse     Patient Active Problem List   Diagnosis Date Noted  . Viral gastroenteritis 02/27/2017  . Mild dehydration 02/27/2017  . Chest pain, atypical 10/23/2016  . Smoker 08/11/2016  . 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 Dyspepsia 02/14/2016  . Myalgia 11/21/2012  . Encounter for routine gynecological examination 05/22/2012  . 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 BACK PAIN 02/16/2010  . ANXIETY DEPRESSION 06/30/2008  . GANGLION CYST, WRIST, LEFT 07/25/2007    Past Surgical History:  Procedure Laterality Date  . cystic ovarian mass  01/2005  . ELBOW LIGAMENT RECONSTRUCTION      OB History    Gravida Para Term Preterm AB Living   0              SAB TAB Ectopic Multiple Live Births                   Home Medications    Prior to Admission medications   Medication Sig Start Date End Date Taking? Authorizing Provider  clonazePAM (KLONOPIN) 0.5 MG tablet TAKE 1/2 TABLET BY MOUTH DAILY AS NEEDED FOR ANXIETY 02/01/17   [provider]  diphenhydrAMINE (BENADRYL) 25 MG tablet Take 25 mg by mouth every 6 (six) hours as needed.    [provider]  DULoxetine (CYMBALTA) 60 MG capsule Take by mouth daily. 02/09/17   [provider]  Multiple Vitamin (MULTIVITAMIN WITH MINERALS) TABS tablet Take 1 tablet by mouth daily.    [provider]  ondansetron (ZOFRAN) 8 MG tablet Take 1 tablet (8 mg total) by mouth every 8 (eight) hours as needed for nausea or vomiting. 02/27/17   Ermalene SearingBedsole, Amy E, MD  traZODone (DESYREL) 150 MG tablet TAKE 1 AT BEDTIME AS NEEDED 02/03/17   [provider]    Family History Family History  Problem Relation Age of Onset  . Hypertension Father     Social History Social History   Tobacco Use  . Smoking status: Current Every Day Smoker    Packs/day: 0.50    Years: 7.00    Pack years: 3.50    Types: Cigarettes  . Smokeless  tobacco: Never Used  Substance Use Topics  . Alcohol use: Yes    Alcohol/week: 0.0 oz  . Drug use: No     Allergies   Patient has no known allergies.   Review of Systems Review of Systems  Constitutional: Negative for chills, diaphoresis and fever.  Respiratory: Negative for shortness of breath.   Cardiovascular: Negative for chest pain, palpitations and leg swelling.  Gastrointestinal: Negative for abdominal pain, diarrhea, nausea and vomiting.  Genitourinary: Negative for difficulty urinating, dysuria, flank pain, frequency and urgency.  Musculoskeletal: Negative for back pain and neck pain.  Neurological: Negative for dizziness, weakness, light-headedness and headaches.       Bilateral lower extremity paresthesias  All other  systems reviewed and are negative.    Physical Exam Updated Vital Signs BP 113/71 (BP Location: Right Arm)   Pulse 80   Temp 98 F (36.7 C) (Oral)   Resp 20   LMP 03/07/2017   SpO2 98%   Physical Exam  Constitutional: She is oriented to person, place, and time. She appears well-developed and well-nourished. No distress.  HENT:  Head: Normocephalic and atraumatic.  Eyes: Conjunctivae and EOM are normal. Pupils are equal, round, and reactive to light.  Neck: Neck supple.  Cardiovascular: Normal rate, regular rhythm, normal heart sounds and intact distal pulses.  Pulmonary/Chest: Effort normal and breath sounds normal. No respiratory distress. She exhibits no tenderness.  Abdominal: Soft. There is no tenderness. There is no guarding.  Musculoskeletal: She exhibits no edema.  Lymphadenopathy:    She has no cervical adenopathy.  Neurological: She is alert and oriented to person, place, and time.  Reflex Scores:      Patellar reflexes are 3+ on the right side and 3+ on the left side. No sensory deficits.  No noted speech deficits. No aphasia. Patient handles oral secretions without difficulty. No noted swallowing defects.  Equal grip strength bilaterally. Strength 5/5 in the upper extremities. Strength 5/5 with flexion and extension of the hips, knees, and ankles bilaterally.  Patellar DTRs 3+ bilaterally. No ankle clonus. Negative Romberg. No gait disturbance.  Coordination intact including heel to shin and finger to nose.  Cranial nerves III-XII grossly intact.  No facial droop.   Skin: Skin is warm and dry. She is not diaphoretic.  Psychiatric: She has a normal mood and affect. Her behavior is normal.  Nursing note and vitals reviewed.    ED Treatments / Results  Labs (all labs ordered are listed, but only abnormal results are displayed) Labs Reviewed  BASIC METABOLIC PANEL - Abnormal; Notable for the following components:      Result Value   BUN 5 (*)    All other  components within normal limits  CBC - Abnormal; Notable for the following components:   Hemoglobin 16.0 (*)    All other components within normal limits  MAGNESIUM  I-STAT TROPONIN, ED  I-STAT BETA HCG BLOOD, ED ([facility], [facility], [facility] ONLY)  I-STAT TROPONIN, ED    EKG  EKG Interpretation None       Radiology Dg Chest 2 View  Result Date: 03/13/2017 CLINICAL DATA:  Chest pain. EXAM: CHEST  2 VIEW COMPARISON:  10/19/2016. FINDINGS: Mediastinum and hilar structures normal. Lungs are clear. No pleural effusion or pneumothorax. Heart size normal. No acute bony abnormality. IMPRESSION: No acute cardiopulmonary disease. Electronically Signed   By: Maisie Fus  Register   On: 03/13/2017 11:49   Dg Lumbar Spine Complete  Result Date: 03/13/2017 CLINICAL DATA:  37 year old female with  paresthesias in lower extremity. Walking when suddenly felt numbness and tingling. Initial encounter. EXAM: LUMBAR SPINE - COMPLETE 4+ VIEW COMPARISON:  02/14/2016 CT. 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 Left L4-5 facet joint appears dysplastic and fused. Spina bifida occulta. Rudimentary disc at this level. 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 Given the patient's symptoms and this appearance, MR may then be considered. On the prior CT, I suspect the conus ends at the T12-L1 level and no bony bridge is seen at the L4-5 fused level. Electronically Signed   By: Lacy Duverney M.D.   On: 03/13/2017 16:06    Procedures Procedures (including critical care time)  Medications Ordered in ED Medications - No data to display   Initial Impression / Assessment and Plan / ED Course  I have reviewed the triage vital signs and the nursing notes.  Pertinent labs & imaging results that were available during my care of the patient were reviewed by me and considered in my medical decision making (see chart for details).  Clinical Course as of Mar 14 1611  Tue Mar 13, 2017    1604 Spoke with Dr. Otelia Limes, neurologist.  Recommends adding a magnesium level to the patient's workup.  Otherwise, she should have an outpatient  follow up with neurology and should have outpatient lumbar and thoracic MRI performed within the next two weeks.  [SJ]    Clinical Course User Index [SJ] Tomoko Sandra C, PA-C    Patient presents with lower extremity paresthesias.  No weakness, bowel or bladder dysfunction, saddle anesthesias, or any other red flag symptoms.  Suspect chest discomfort may be connected with anxiety rather than ACS.  Troponins negative. PERC negative.  Patient to have outpatient neurology follow and MRI.  End of shift patient care handoff report given to Ebbie Ridge, PA-C.  Plan: Follow up on magnesium level and discharge.   Findings and plan of care discussed with Drema Pry, MD.   Vitals:   03/13/17 1131 03/13/17 1319  BP: 106/86 113/71  Pulse: 87 80  Resp: 16 20  Temp: 98.3 F (36.8 C) 98 F (36.7 C)  TempSrc: Oral Oral  SpO2: 100% 98%     Final Clinical Impressions(s) / ED Diagnoses   Final diagnoses:  Paresthesia    ED Discharge Orders    None       Concepcion Living 03/13/17 1639    Nira Conn, MD 03/14/17 [phone]

## 2017-04-04 ENCOUNTER — Ambulatory Visit: Payer: Managed Care, Other (non HMO) | Admitting: Neurology

## 2017-05-03 ENCOUNTER — Encounter: Payer: Self-pay | Admitting: Neurology

## 2017-05-30 ENCOUNTER — Encounter: Payer: Self-pay | Admitting: Neurology

## 2017-07-06 ENCOUNTER — Ambulatory Visit (INDEPENDENT_AMBULATORY_CARE_PROVIDER_SITE_OTHER): Payer: Managed Care, Other (non HMO) | Admitting: Neurology

## 2017-07-06 ENCOUNTER — Encounter

## 2017-07-06 ENCOUNTER — Encounter: Payer: Self-pay | Admitting: Neurology

## 2017-07-06 VITALS — BP 104/78 | HR 84 | Ht 63.0 in | Wt 144.0 lb

## 2017-07-06 DIAGNOSIS — R937 Abnormal findings on diagnostic imaging of other parts of musculoskeletal system: Secondary | ICD-10-CM

## 2017-07-06 DIAGNOSIS — R202 Paresthesia of skin: Secondary | ICD-10-CM | POA: Diagnosis not present

## 2017-07-06 NOTE — Patient Instructions (Addendum)
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  We will evaluate closer with MRI of the lumbar spine.    We [facility] and they will call you directly to schedule your appointment. They are located at [address] If you need to contact them directly please call [phone].

## 2017-07-06 NOTE — Progress Notes (Signed)
NEUROLOGY CONSULTATION NOTE  Shannon Dyer MRN: [id number] DOB: 08/01/1980  Referring provider: Roxy Manns, MD Primary care provider: Roxy Manns, MD  Reason for consult:  paresthesia  HISTORY OF PRESENT ILLNESS: Shannon Dyer is a 37 year old right-handed female with history of panic attacks who presents for evaluation of paresthesias.  She is accompanied by her husband.  History supplemented by ED note.  She presented to the ED on 03/13/17 for onset of numbness and tingling of the lower extremities from below the knee to the feet in stocking distribution.  It occurred while walking at work.  She has some chronic left lower back pain but she never experienced these symptoms before.  There was no associated radicular pain or weakness down the legs or involvement of the upper extremities.  She denies bowel or bladder dysfunction.  She works at [employer], which requires her to lift heavy packages.  However, she was not lifting at this time.  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  She would experience the paresthesias off and on for the next couple of months.  Symptoms subsided in mid-May.    PAST MEDICAL HISTORY: Past Medical History:  Diagnosis Date  . Panic attacks   . Tobacco abuse     PAST SURGICAL HISTORY: Past Surgical History:  Procedure Laterality Date  . cystic ovarian mass  01/2005  . ELBOW LIGAMENT RECONSTRUCTION      MEDICATIONS: Current Outpatient Medications on File Prior to Visit  Medication Sig Dispense Refill  . clonazePAM (KLONOPIN) 0.5 MG tablet TAKE 0.5 mg TABLET BY MOUTH DAILY AS NEEDED FOR ANXIETY  1  . diphenhydrAMINE (BENADRYL) 25 MG tablet Take 25 mg by mouth every 6 (six) hours as needed.    . DULoxetine (CYMBALTA) 60 MG capsule Take 60 mg by mouth daily.   0  . Multiple Vitamin (MULTIVITAMIN WITH MINERALS) TABS tablet Take 1 tablet by mouth daily.    . ondansetron (ZOFRAN) 8 MG  tablet Take 1 tablet (8 mg total) by mouth every 8 (eight) hours as needed for nausea or vomiting. (Patient not taking: Reported on 07/06/2017) 20 tablet 0  . traZODone (DESYREL) 150 MG tablet TAKE 150 mg  AT BEDTIME AS NEEDED  1   No current facility-administered medications on file prior to visit.     ALLERGIES: No Known Allergies  FAMILY HISTORY: Family History  Problem Relation Age of Onset  . Hypertension Father 47       died of unknown causes-autopsy    SOCIAL HISTORY: Social History   Socioeconomic History  . Marital status: Married    Spouse name: Reita Cliche  . Number of children: 0  . Years of education: Not on file  . Highest education level: 12th grade  Occupational History    Employer: [employer]  Social Needs  . Financial resource strain: Not on file  . Food insecurity:    Worry: Not on file    Inability: Not on file  . Transportation needs:    Medical: Not on file    Non-medical: Not on file  Tobacco Use  . Smoking status: Current Every Day Smoker    Packs/day: 0.50    Years: 7.00    Pack years: 3.50    Types: Cigarettes  . Smokeless tobacco: Never Used  Substance and Sexual Activity  . Alcohol use: Yes    Alcohol/week: 0.0 oz  . Drug use: No  . Sexual activity: Yes  Birth control/protection: None  Lifestyle  . Physical activity:    Days per week: Not on file    Minutes per session: Not on file  . Stress: Not on file  Relationships  . Social connections:    Talks on phone: Not on file    Gets together: Not on file    Attends religious service: Not on file    Active member of club or organization: Not on file    Attends meetings of clubs or organizations: Not on file    Relationship status: Not on file  . Intimate partner violence:    Fear of current or ex partner: Not on file    Emotionally abused: Not on file    Physically abused: Not on file    Forced sexual activity: Not on file  Other Topics Concern  . Not on file  Social History Narrative     Patient is right-handed, though states she can use either. She lives wirth her husband in a 1 story house, 5 steps to enter. She drinks I cup of coffee and 1 soda a day. She walks several miles a day a work.    REVIEW OF SYSTEMS: Constitutional: No fevers, chills, or sweats, no generalized fatigue, change in appetite Eyes: No visual changes, double vision, eye pain Ear, nose and throat: No hearing loss, ear pain, nasal congestion, sore throat Cardiovascular: No chest pain, palpitations Respiratory:  No shortness of breath at rest or with exertion, wheezes GastrointestinaI: No nausea, vomiting, diarrhea, abdominal pain, fecal incontinence Genitourinary:  No dysuria, urinary retention or frequency Musculoskeletal:  Back pain Integumentary: No rash, pruritus, skin lesions Neurological: as above Psychiatric: No depression, insomnia, anxiety Endocrine: No palpitations, fatigue, diaphoresis, mood swings, change in appetite, change in weight, increased thirst Hematologic/Lymphatic:  No purpura, petechiae. Allergic/Immunologic: no itchy/runny eyes, nasal congestion, recent allergic reactions, rashes  PHYSICAL EXAM: Vitals:   07/06/17 1452  BP: 104/78  Pulse: 84  SpO2: 97%   General: No acute distress.  Patient appears well-groomed.  Head:  Normocephalic/atraumatic Eyes:  fundi examined but not visualized Neck: supple, no paraspinal tenderness, full range of motion Back: left lower tenderness Heart: regular rate and rhythm Lungs: Clear to auscultation bilaterally. Vascular: No carotid bruits. Neurological Exam: Mental status: alert and oriented to person, place, and time, recent and remote memory intact, fund of knowledge intact, attention and concentration intact, speech fluent and not dysarthric, language intact. Cranial nerves: CN I: not tested CN II: pupils equal, round and reactive to light, visual fields intact CN III, IV, VI:  full range of motion, no nystagmus, no ptosis CN  V: facial sensation intact CN VII: upper and lower face symmetric CN VIII: hearing intact CN IX, X: gag intact, uvula midline CN XI: sternocleidomastoid and trapezius muscles intact CN XII: tongue midline Bulk & Tone: normal, no fasciculations. Motor:  5/5 throughout  Sensation:  Pinprick and vibration sensation intact. Deep Tendon Reflexes:  2+ throughout, toes downgoing.  Finger to nose testing:  Without dysmetria.  Heel to shin:  Without dysmetria.  Gait:  Normal station and stride.  Able to turn and tandem walk. Romberg negative.  IMPRESSION: 1.  Intermittent bilateral lower extremity paresthesia 2.  Left lower back non-radiating back pain 3.  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  Symptoms have resolved now but given the findings on X-rays, follow up imaging should be warranted.  PLAN: MRI  of lumbar spine.  Further recommendations pending results.  Thank you for allowing me to take part in the care of this patient.  Shon Millet, DO  CC: Roxy Manns, MD

## 2017-07-16 ENCOUNTER — Ambulatory Visit: Payer: Managed Care, Other (non HMO) | Admitting: Neurology

## 2017-07-18 ENCOUNTER — Ambulatory Visit
Admission: RE | Admit: 2017-07-18 | Discharge: 2017-07-18 | Disposition: A | Discharge status: Home / Self Care | Payer: Managed Care, Other (non HMO) | Source: Ambulatory Visit | Attending: Neurology | Admitting: Neurology

## 2017-07-18 DIAGNOSIS — R202 Paresthesia of skin: Secondary | ICD-10-CM

## 2017-07-18 DIAGNOSIS — R937 Abnormal findings on diagnostic imaging of other parts of musculoskeletal system: Secondary | ICD-10-CM

## 2017-07-20 ENCOUNTER — Telehealth: Payer: Self-pay

## 2017-07-20 NOTE — Telephone Encounter (Signed)
Pt returned call, advised her of MRI results. Pt questioned next step.  Advised her I will inquire and call her back next week.

## 2017-07-20 NOTE — Telephone Encounter (Signed)
Called and LMOVM for Pt to return my call 

## 2017-07-20 NOTE — Telephone Encounter (Signed)
-----   Message from Rebecca S Tat, DO sent at 07/19/2017  7:36 AM EDT ----- You can let pt know that MRI lumbar spine is unremarkable 

## 2017-07-27 ENCOUNTER — Telehealth: Payer: Self-pay | Admitting: *Deleted

## 2017-07-27 ENCOUNTER — Telehealth: Payer: Self-pay

## 2017-07-27 DIAGNOSIS — R202 Paresthesia of skin: Secondary | ICD-10-CM

## 2017-07-27 NOTE — Telephone Encounter (Signed)
-----   Message from Octaviano Battyebecca S Tat, DO sent at 07/19/2017  7:36 AM EDT ----- You can let pt know that MRI lumbar spine is unremarkable

## 2017-07-27 NOTE — Telephone Encounter (Signed)
See next note

## 2017-07-27 NOTE — Telephone Encounter (Signed)
-----   Message from Drema DallasAdam R Jaffe, DO sent at 07/25/2017  8:33 PM EDT ----- Recommend NCV-EMG of both lower extremities for numbness and tingling.

## 2017-07-27 NOTE — Telephone Encounter (Signed)
Called Pt, LMOVM advising EMG recommendation. Order is in system

## 2017-09-24 ENCOUNTER — Ambulatory Visit (INDEPENDENT_AMBULATORY_CARE_PROVIDER_SITE_OTHER): Payer: Managed Care, Other (non HMO) | Admitting: Family Medicine

## 2017-09-24 ENCOUNTER — Encounter: Payer: Self-pay | Admitting: Family Medicine

## 2017-09-24 ENCOUNTER — Ambulatory Visit: Payer: Self-pay | Admitting: Family Medicine

## 2017-09-24 VITALS — BP 112/82 | HR 90 | Temp 98.3°F | Ht 63.0 in | Wt 150.4 lb

## 2017-09-24 DIAGNOSIS — H8143 Vertigo of central origin, bilateral: Secondary | ICD-10-CM | POA: Diagnosis not present

## 2017-09-24 DIAGNOSIS — R42 Dizziness and giddiness: Secondary | ICD-10-CM | POA: Diagnosis not present

## 2017-09-24 DIAGNOSIS — L989 Disorder of the skin and subcutaneous tissue, unspecified: Secondary | ICD-10-CM

## 2017-09-24 DIAGNOSIS — G4452 New daily persistent headache (NDPH): Secondary | ICD-10-CM

## 2017-09-24 DIAGNOSIS — F17200 Nicotine dependence, unspecified, uncomplicated: Secondary | ICD-10-CM

## 2017-09-24 DIAGNOSIS — R51 Headache: Secondary | ICD-10-CM

## 2017-09-24 DIAGNOSIS — IMO0001 Reserved for inherently not codable concepts without codable children: Secondary | ICD-10-CM

## 2017-09-24 DIAGNOSIS — R519 Headache, unspecified: Secondary | ICD-10-CM | POA: Insufficient documentation

## 2017-09-24 MED ORDER — KETOROLAC TROMETHAMINE 30 MG/ML IJ SOLN
30.0000 mg | Freq: Once | INTRAMUSCULAR | Status: AC
  Administered 2017-09-24: 30 mg via INTRAMUSCULAR

## 2017-09-24 MED ORDER — PROMETHAZINE HCL 25 MG PO TABS: 25.0000 mg | ORAL_TABLET | Freq: Three times a day (TID) | ORAL | 1 refills | Status: DC | PRN

## 2017-09-24 MED ORDER — PROMETHAZINE HCL 25 MG/ML IJ SOLN
50.0000 mg | Freq: Once | INTRAMUSCULAR | Status: AC
  Administered 2017-09-24: 50 mg via INTRAMUSCULAR

## 2017-09-24 NOTE — Telephone Encounter (Signed)
  Pt called with h/o migraine headache over the weekend and new onset dizziness that started at 3:30 this am . Pt stated that she felt "out of balance" and "woozy." Pt not currently having a migraine. Pt stated that she has never had these symptoms before. Pt's heart rate is 104 this am. Pt stated she has had cold chills. Care advice given and pt given same day appt with Dr Milinda Antisower for 11:15 this am.  Reason for Disposition . [1] MILD dizziness (e.g., walking normally) AND [2] has NOT been evaluated by physician for this  (Exception: dizziness caused by heat exposure, sudden standing, or poor fluid intake)  Answer Assessment - Initial Assessment Questions 1. DESCRIPTION: "Describe your dizziness."    Out of balance and feels woozy  LIGHTHEADED: "Do you feel lightheaded?" (e.g., somewhat faint, woozy, weak upon standing)   Not currently but did early am  3. VERTIGO: "Do you feel like either you or the room is spinning or tilting?" (i.e. vertigo)     no 4. SEVERITY: "How bad is it?"  "Do you feel like you are going to faint?" "Can you stand and walk?"   - MILD - walking normally   - MODERATE - interferes with normal activities (e.g., work, school)    - SEVERE - unable to stand, requires support to walk, feels like passing out now.      mild 5. ONSET:  "When did the dizziness begin?"     This am 0330 6. AGGRAVATING FACTORS: "Does anything make it worse?" (e.g., standing, change in head position)     If closes eyes feels like the room is spinning 7. HEART RATE: "Can you tell me your heart rate?" "How many beats in 15 seconds?"  (Note: not all patients can do this)       104 per minute 8. CAUSE: "What do you think is causing the dizziness?"     Migraine headaches 9. RECURRENT SYMPTOM: "Have you had dizziness before?" If so, ask: "When was the last time?" "What happened that time?"     no 10. OTHER SYMPTOMS: "Do you have any other symptoms?" (e.g., fever, chest pain, vomiting, diarrhea, bleeding)       No- cold chills 11. PREGNANCY: "Is there any chance you are pregnant?" "When was your last menstrual period?"       No LMP: August 10  Protocols used: Donnie CoffinIZZINESS - LIGHTHEADEDNESS-A-AH

## 2017-09-24 NOTE — Assessment & Plan Note (Signed)
Disc in detail risks of smoking and possible outcomes including copd, vascular/ heart disease, cancer , respiratory and sinus infections  Pt voices understanding She is not ready to quit yet  

## 2017-09-24 NOTE — Assessment & Plan Note (Signed)
New headache with migraine features (unilat/wakes up with) as well as dizziness  Ref for CT of head (this is new in the last mo)  Reassuring exam  Disc habits for HA prev and handout given  toradol and phenergan IM today to try and abort this headache  Urged to inc water/avoid caffeine and get better sleep (use trazodone nightly if needed)  Smoking cessation enc Further plan to follow after CT

## 2017-09-24 NOTE — Patient Instructions (Addendum)
Aim for 64 oz of fluids per day /mostly water Avoid caffeine   toradol (anti inflammatory pain medicine) and phenergan (for dizziness and headache)- shots today  Drink lots of water  Avoid caffeine unless you have a headache   We will plan a CT scan   If headache or dizziness suddenly worsen-go to the emergency room

## 2017-09-24 NOTE — Progress Notes (Signed)
Subjective:    Patient ID: Shannon Dyer, female    DOB: 1980/11/15, 37 y.o.   MRN: [id number]  HPI  Here for migraine and dizziness   Wt Readings from Last 3 Encounters:  09/24/17 150 lb 6.4 oz (68.2 kg)  07/06/17 144 lb (65.3 kg)  02/27/17 146 lb 4 oz (66.3 kg)   26.64 kg/m   More migraines for a month - never had them before  Yesterday and today   Friday and Saturday - 2 weeks in a row   Dizzy/light headed  HR goes up  Head hurts on the R side (always on the R)  Constant- not throbbing  Light bothers her / but vision is ok  No noise sensitivity  No n/v  Sometimes wakes up with some   No stroke symptoms   5.5/10 on pain scale today    Lump on top of head feels bigger than it used to be    Smoking -less than 1/2ppd (not in the process of quitting) Caffeine - 1 soda or less per day  Drinking a lot of water Hot at work- she keeps up with fluids   No changes in stress or medications  Takes cymbalta  trazadone prn sleep  Mood is stable   Otc: took some ibuprofen (2) - helped a little  Does not get rid of headache   BP Readings from Last 3 Encounters:  09/24/17 112/82  07/06/17 104/78  03/13/17 (!) 133/94   Pulse Readings from Last 3 Encounters:  09/24/17 90  07/06/17 84  03/13/17 78    Patient Active Problem List   Diagnosis Date Noted  . Headache 09/24/2017  . Dizziness 09/24/2017  . Smoker 08/11/2016  . 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 Dyspepsia 02/14/2016  . Myalgia 11/21/2012  . Encounter for routine gynecological examination 05/22/2012  . 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 BACK PAIN 02/16/2010  . ANXIETY DEPRESSION 06/30/2008  . GANGLION CYST, WRIST, LEFT 07/25/2007   Past Medical History:  Diagnosis Date  . Panic attacks   . Tobacco abuse    Past Surgical History:  Procedure Laterality Date  . cystic ovarian mass  01/2005  . ELBOW LIGAMENT RECONSTRUCTION     Social History   Tobacco Use  . Smoking status:  Current Every Day Smoker    Packs/day: 0.50    Years: 7.00    Pack years: 3.50    Types: Cigarettes  . Smokeless tobacco: Never Used  Substance Use Topics  . Alcohol use: Yes    Alcohol/week: 0.0 standard drinks  . Drug use: No   Family History  Problem Relation Age of Onset  . Hypertension Father 59       died of unknown causes-autopsy   No Known Allergies Current Outpatient Medications on File Prior to Visit  Medication Sig Dispense Refill  . DULoxetine (CYMBALTA) 60 MG capsule Take 60 mg by mouth daily.   0  . traZODone (DESYREL) 150 MG tablet TAKE 150 mg  AT BEDTIME AS NEEDED  1  . clonazePAM (KLONOPIN) 0.5 MG tablet TAKE 0.5 mg TABLET BY MOUTH DAILY AS NEEDED FOR ANXIETY  1  . diphenhydrAMINE (BENADRYL) 25 MG tablet Take 25 mg by mouth every 6 (six) hours as needed.    . Multiple Vitamin (MULTIVITAMIN WITH MINERALS) TABS tablet Take 1 tablet by mouth daily.    . ondansetron (ZOFRAN) 8 MG tablet Take 1 tablet (8 mg total) by mouth every 8 (eight) hours as needed for  nausea or vomiting. (Patient not taking: Reported on 07/06/2017) 20 tablet 0   No current facility-administered medications on file prior to visit.     Review of Systems  Constitutional: Positive for fatigue. Negative for activity change, appetite change, fever and unexpected weight change.  HENT: Negative for congestion, ear pain, rhinorrhea, sinus pressure and sore throat.   Eyes: Negative for pain, redness and visual disturbance.  Respiratory: Negative for cough, shortness of breath and wheezing.   Cardiovascular: Negative for chest pain, palpitations and leg swelling.  Gastrointestinal: Negative for abdominal pain, blood in stool, constipation and diarrhea.  Endocrine: Negative for polydipsia and polyuria.  Genitourinary: Negative for dysuria, frequency and urgency.  Musculoskeletal: Negative for arthralgias, back pain and myalgias.  Skin: Negative for pallor and rash.       Cyst/lesion on scalp R side     Allergic/Immunologic: Negative for environmental allergies.  Neurological: Positive for dizziness and headaches. Negative for tremors, seizures, syncope, facial asymmetry, weakness and numbness.  Hematological: Negative for adenopathy. Does not bruise/bleed easily.  Psychiatric/Behavioral: Negative for decreased concentration and dysphoric mood. The patient is not nervous/anxious.        Mood has been stable        Objective:   Physical Exam  Constitutional: She is oriented to person, place, and time. She appears well-developed and well-nourished. No distress.  Uncomfortable from headache-light is bothering her  HENT:  Head: Normocephalic and atraumatic.  Right Ear: External ear normal.  Left Ear: External ear normal.  Nose: Nose normal.  Mouth/Throat: Oropharynx is clear and moist. No oropharyngeal exudate.  No sinus tenderness No temporal tenderness  No TMJ tenderness  Eyes: Pupils are equal, round, and reactive to light. Conjunctivae and EOM are normal. Right eye exhibits no discharge. Left eye exhibits no discharge. No scleral icterus.  No nystagmus- but lateral gaze causes dizziness both ways   Neck: Normal range of motion and full passive range of motion without pain. Neck supple. No JVD present. Carotid bruit is not present. No tracheal deviation present. No thyromegaly present.  Cardiovascular: Normal rate, regular rhythm and normal heart sounds.  No murmur heard. Pulmonary/Chest: Effort normal and breath sounds normal. No respiratory distress. She has no wheezes. She has no rales.  Abdominal: Soft. Bowel sounds are normal. She exhibits no distension and no mass. There is no tenderness.  Musculoskeletal: She exhibits no edema or tenderness.  Lymphadenopathy:    She has no cervical adenopathy.  Neurological: She is alert and oriented to person, place, and time. She has normal strength and normal reflexes. She displays no atrophy and no tremor. No cranial nerve deficit or  sensory deficit. She exhibits normal muscle tone. She displays a negative Romberg sign. Coordination and gait normal.  No focal cerebellar signs   Skin: Skin is warm and dry. No rash noted. No pallor.  1 cm area of fullness R scalp/ with 2 mm skin colored firm raised ? Nevus on top -non tender   Psychiatric: She has a normal mood and affect. Her behavior is normal. Thought content normal.  Pleasant           Assessment & Plan:   Problem List Items Addressed This Visit      Musculoskeletal and Integument   Skin lesion    R scalp -per pt bigger than past Looks like b9 nevus but also features of cyst  nontender Doubt related to headache but may warrant dermatology eval        Other  Dizziness    Along with migraine type headache /new in the past mo with no hx of migraine  tx with phenergan today (IM plus px)  CT of head ordered  Further plan to follow        Relevant Medications   ketorolac (TORADOL) 30 MG/ML injection 30 mg (Completed)   promethazine (PHENERGAN) injection 50 mg (Completed)   Headache - Primary    New headache with migraine features (unilat/wakes up with) as well as dizziness  Ref for CT of head (this is new in the last mo)  Reassuring exam  Disc habits for HA prev and handout given  toradol and phenergan IM today to try and abort this headache  Urged to inc water/avoid caffeine and get better sleep (use trazodone nightly if needed)  Smoking cessation enc Further plan to follow after CT      Relevant Medications   ketorolac (TORADOL) 30 MG/ML injection 30 mg (Completed)   promethazine (PHENERGAN) injection 50 mg (Completed)   Other Relevant Orders   CT Head Wo Contrast   Smoker    Disc in detail risks of smoking and possible outcomes including copd, vascular/ heart disease, cancer , respiratory and sinus infections  Pt voices understanding  She is not ready to quit yet        Other Visit Diagnoses    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

## 2017-09-24 NOTE — Assessment & Plan Note (Signed)
Along with migraine type headache /new in the past mo with no hx of migraine  tx with phenergan today (IM plus px)  CT of head ordered  Further plan to follow

## 2017-09-24 NOTE — Assessment & Plan Note (Signed)
R scalp -per pt bigger than past Looks like b9 nevus but also features of cyst  nontender Doubt related to headache but may warrant dermatology eval

## 2017-09-25 ENCOUNTER — Other Ambulatory Visit: Payer: Self-pay

## 2017-09-25 ENCOUNTER — Emergency Department (HOSPITAL_COMMUNITY)
Admission: EM | Admit: 2017-09-25 | Discharge: 2017-09-26 | Disposition: A | Discharge status: Home / Self Care | Payer: Managed Care, Other (non HMO) | Attending: Emergency Medicine | Admitting: Emergency Medicine

## 2017-09-25 ENCOUNTER — Telehealth: Payer: Self-pay

## 2017-09-25 ENCOUNTER — Emergency Department (HOSPITAL_COMMUNITY): Payer: Managed Care, Other (non HMO)

## 2017-09-25 ENCOUNTER — Encounter (HOSPITAL_COMMUNITY): Payer: Self-pay

## 2017-09-25 DIAGNOSIS — R519 Headache, unspecified: Secondary | ICD-10-CM

## 2017-09-25 DIAGNOSIS — R42 Dizziness and giddiness: Secondary | ICD-10-CM

## 2017-09-25 DIAGNOSIS — R51 Headache: Secondary | ICD-10-CM | POA: Insufficient documentation

## 2017-09-25 DIAGNOSIS — L729 Follicular cyst of the skin and subcutaneous tissue, unspecified: Secondary | ICD-10-CM

## 2017-09-25 DIAGNOSIS — G4452 New daily persistent headache (NDPH): Secondary | ICD-10-CM

## 2017-09-25 DIAGNOSIS — F17210 Nicotine dependence, cigarettes, uncomplicated: Secondary | ICD-10-CM | POA: Insufficient documentation

## 2017-09-25 DIAGNOSIS — Z79899 Other long term (current) drug therapy: Secondary | ICD-10-CM | POA: Insufficient documentation

## 2017-09-25 LAB — BASIC METABOLIC PANEL
Anion gap: 8 (ref 5–15)
BUN: 13 mg/dL (ref 6–20)
CHLORIDE: 108 mmol/L (ref 98–111)
CO2: 23 mmol/L (ref 22–32)
Calcium: 8.8 mg/dL — ABNORMAL LOW (ref 8.9–10.3)
Creatinine, Ser: 0.96 mg/dL (ref 0.44–1.00)
GFR calc Af Amer: >60 mL/min (ref >60)
GLUCOSE: 98 mg/dL (ref 70–99)
POTASSIUM: 4.2 mmol/L (ref 3.5–5.1)
SODIUM: 139 mmol/L (ref 135–145)

## 2017-09-25 LAB — I-STAT BETA HCG BLOOD, ED (MC, WL, AP ONLY)

## 2017-09-25 LAB — CBC
HEMATOCRIT: 41.2 % (ref 36.0–46.0)
Hemoglobin: 14 g/dL (ref 12.0–15.0)
MCH: 33.4 pg (ref 26.0–34.0)
MCHC: 34 g/dL (ref 30.0–36.0)
MCV: 98.3 fL (ref 78.0–100.0)
Platelets: 279 10*3/uL (ref 150–400)
RBC: 4.19 MIL/uL (ref 3.87–5.11)
RDW: 13 % (ref 11.5–15.5)
WBC: 5.6 10*3/uL (ref 4.0–10.5)

## 2017-09-25 LAB — CBG MONITORING, ED
GLUCOSE-CAPILLARY: 117 mg/dL — AB (ref 70–99)
Glucose-Capillary: 98 mg/dL (ref 70–99)

## 2017-09-25 MED ORDER — SODIUM CHLORIDE 0.9 % IV BOLUS
1000.0000 mL | Freq: Once | INTRAVENOUS | Status: AC
  Administered 2017-09-25: 1000 mL via INTRAVENOUS

## 2017-09-25 MED ORDER — DIPHENHYDRAMINE HCL 50 MG/ML IJ SOLN
25.0000 mg | Freq: Once | INTRAMUSCULAR | Status: AC
  Administered 2017-09-25: 25 mg via INTRAVENOUS
  Filled 2017-09-25: qty 1

## 2017-09-25 MED ORDER — PROCHLORPERAZINE EDISYLATE 10 MG/2ML IJ SOLN
10.0000 mg | Freq: Once | INTRAMUSCULAR | Status: AC
  Administered 2017-09-25: 10 mg via INTRAVENOUS
  Filled 2017-09-25: qty 2

## 2017-09-25 MED ORDER — DEXAMETHASONE SODIUM PHOSPHATE 10 MG/ML IJ SOLN
10.0000 mg | Freq: Once | INTRAMUSCULAR | Status: AC
  Administered 2017-09-25: 10 mg via INTRAVENOUS
  Filled 2017-09-25: qty 1

## 2017-09-25 NOTE — ED Triage Notes (Addendum)
Patient c/o migraine headaches that have been for frequent and lasting longer. Patient states she has had this migraine x 3 days. Patient  States she is scheduled for  Head CT tomorrow, but pain was not tolerable. Patient reports a cyst on her scalp. Patient c/o light sensitivity as well.

## 2017-09-25 NOTE — Telephone Encounter (Signed)
How is her headache?   She can return to work based on symptoms-I would probably keep her out tomorrow because she has the CT scan scheduled anyway - and write the note to return 8/29 if feeling better  Let me know if she is agreeable to that

## 2017-09-25 NOTE — Telephone Encounter (Signed)
Still feeling lightheaded, her head hurts a little but med is helping some she is also taking, Excedrin migraine. Pt said her head was really hurting last night but she took med and woke up and it was better but she was still dizzy and light headed. Pt said she is okay with doc note putting her out until 09/27/17 if she is feeling better, please write note

## 2017-09-25 NOTE — Telephone Encounter (Signed)
Spoke with patient. Patient needs a work note if possible. Patient was out yesterday and today and is going to try to go to work tomorrow on 09/26/17 but is not sure if she can. She tried to go today but woke up really dizzy. Her CT scan is scheduled for 09/26/2017. Patient is not sure how long she should be out. Please Jonelle Sportsadvise-Shannon Dyer V Shannon Dyer, RMA

## 2017-09-25 NOTE — Telephone Encounter (Signed)
Letter in IN box 

## 2017-09-25 NOTE — Discharge Instructions (Addendum)
Your CT scan and labs look good today.  You may continue using Excedrin as needed for headaches.  Please follow-up with your primary care doctor for continued evaluation of these headaches.  If you desire to have scalp cyst removed your doctor can refer you to a dermatologist for further treatment.  Get help right away if: Your headache becomes really bad. You keep throwing up. You have a stiff neck. You have trouble seeing. You have trouble speaking. You have pain in the eye or ear. Your muscles are weak or you lose muscle control. You lose your balance or have trouble walking. You feel like you will pass out (faint) or you pass out. You have confusion.

## 2017-09-25 NOTE — ED Provider Notes (Signed)
[facility]-EMERGENCY DEPT Provider Note   CSN: [id number] Arrival date & time: 09/25/17  1757     History   Chief Complaint Chief Complaint  Patient presents with  . Dizziness  . Migraine  . Abscess    HPI Shannon Dyer is a 37 y.o. female.  Shannon Dyer is a 37 y.o. Female with history of panic attacks and tobacco use, who presents to the emergency department for evaluation of headache.  Patient reports for the past month she has been getting intermittent headaches, that are present on the right side of her head are a constant dull throbbing ache, make her sensitive to light and sound.  She reports associated nausea but no vomiting.  She reports the most recent headache has been persistent over the past 3 days intermittently improving but then coming back.  Headache progresses gradually and is not maximal intensity at onset.  She saw her primary care doctor regarding these headaches and has a head CT scheduled for tomorrow but reports worsening pain today despite using Excedrin.  She reports she has had a cyst on her scalp for 2 to 3 years and wondered if this could be contributing, she feels like is gotten a little bit bigger, is not red or painful and has not had any drainage.  Aside from light sensitivity patient denies any associated vision changes, no fevers or chills, no neck stiffness.  No change in speech or difficulty swallowing, no facial asymmetry or numbness, no numbness or weakness in extremities.  Patient reports she does feel slightly dizzy with migraines but this does not persist when headache is not present.  No associated chest pain, shortness of breath or abdominal pain.  Aside from Excedrin patient has not had anything for headaches, reports her primary care doctor gave her 2 shots when she saw her earlier this week, but she cannot remember what these were.   The history is provided by the patient.    Past Medical History:  Diagnosis Date    . Panic attacks   . Tobacco abuse     Patient Active Problem List   Diagnosis Date Noted  . Headache 09/24/2017  . Dizziness 09/24/2017  . Skin lesion 09/24/2017  . Smoker 08/11/2016  . 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 Dyspepsia 02/14/2016  . Myalgia 11/21/2012  . Encounter for routine gynecological examination 05/22/2012  . 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 BACK PAIN 02/16/2010  . ANXIETY DEPRESSION 06/30/2008  . GANGLION CYST, WRIST, LEFT 07/25/2007    Past Surgical History:  Procedure Laterality Date  . cystic ovarian mass  01/2005  . ELBOW LIGAMENT RECONSTRUCTION       OB History    Gravida  0   Para      Term      Preterm      AB      Living        SAB      TAB      Ectopic      Multiple      Live Births               Home Medications    Prior to Admission medications   Medication Sig Start Date End Date Taking? Authorizing Provider  DULoxetine (CYMBALTA) 60 MG capsule Take 60 mg by mouth daily.  02/09/17  Yes [provider]  promethazine (PHENERGAN) 25 MG tablet Take 1 tablet (25 mg total) by mouth every 8 (eight) hours as needed  for nausea or vomiting (dizziness). Caution of sedation 09/24/17  Yes Tower, Audrie GallusMarne A, MD  traZODone (DESYREL) 150 MG tablet Take 150 mg by mouth at bedtime as needed for sleep.  02/03/17  Yes [provider]  ondansetron (ZOFRAN) 8 MG tablet Take 1 tablet (8 mg total) by mouth every 8 (eight) hours as needed for nausea or vomiting. Patient not taking: Reported on 07/06/2017 02/27/17   Excell SeltzerBedsole, Amy E, MD    Family History Family History  Problem Relation Age of Onset  . Hypertension Father 6160       died of unknown causes-autopsy    Social History Social History   Tobacco Use  . Smoking status: Current Every Day Smoker    Packs/day: 0.50    Years: 7.00    Pack years: 3.50    Types: Cigarettes  . Smokeless tobacco: Never Used  Substance Use Topics  . Alcohol use: Yes     Alcohol/week: 0.0 standard drinks    Comment: occasionally  . Drug use: No     Allergies   Patient has no known allergies.   Review of Systems Review of Systems  Constitutional: Negative for chills and fever.  HENT: Negative for congestion, ear pain, rhinorrhea and sore throat.   Eyes: Positive for photophobia. Negative for pain, redness and visual disturbance.  Respiratory: Negative for cough and shortness of breath.   Cardiovascular: Negative for chest pain.  Gastrointestinal: Positive for nausea. Negative for abdominal pain and vomiting.  Genitourinary: Negative for dysuria and frequency.  Musculoskeletal: Negative for arthralgias, back pain, myalgias, neck pain and neck stiffness.  Skin: Negative for color change and rash.  Neurological: Positive for dizziness and headaches. Negative for tremors, seizures, syncope, facial asymmetry, speech difficulty, weakness, light-headedness and numbness.     Physical Exam Updated Vital Signs BP 122/75 (BP Location: Left Arm)   Pulse 83   Temp 98.2 F (36.8 C) (Oral)   Resp (!) 21   Ht 5\' 3"  (1.6 m)   Wt 68 kg   LMP 09/08/2017 (Exact Date)   SpO2 98%   BMI 26.57 kg/m   Physical Exam  Constitutional: She is oriented to person, place, and time. She appears well-developed and well-nourished. No distress.  HENT:  Head: Normocephalic and atraumatic.  Mouth/Throat: Oropharynx is clear and moist.  Eyes: Pupils are equal, round, and reactive to light. EOM are normal. Right eye exhibits no discharge. Left eye exhibits no discharge.  No nystagmus  Neck: Neck supple.  No nuchal rigidity, negative Kernig's and Brudzinski sign  Cardiovascular: Normal rate, regular rhythm, normal heart sounds and intact distal pulses.  Pulmonary/Chest: Effort normal and breath sounds normal. No respiratory distress. She has no wheezes. She has no rales.  Respirations equal and unlabored, patient able to speak in full sentences, lungs clear to auscultation  bilaterally  Abdominal: Soft. Bowel sounds are normal. She exhibits no distension and no mass. There is no tenderness. There is no guarding.  Musculoskeletal: She exhibits no edema or deformity.  Neurological: She is alert and oriented to person, place, and time. Coordination normal.  Speech is clear, able to follow commands CN III-XII intact Normal strength in upper and lower extremities bilaterally including dorsiflexion and plantar flexion, strong and equal grip strength Sensation normal to light and sharp touch Moves extremities without ataxia, coordination intact Normal finger to nose and rapid alternating movements No pronator drift Ambulatory with steady gait  Skin: Skin is warm and dry. Capillary refill takes less than 2  seconds. She is not diaphoretic.  Psychiatric: She has a normal mood and affect. Her behavior is normal.  Nursing note and vitals reviewed.    ED Treatments / Results  Labs (all labs ordered are listed, but only abnormal results are displayed) Labs Reviewed  BASIC METABOLIC PANEL - Abnormal; Notable for the following components:      Result Value   Calcium 8.8 (*)    All other components within normal limits  URINALYSIS, ROUTINE W REFLEX MICROSCOPIC - Abnormal; Notable for the following components:   Leukocytes, UA TRACE (*)    All other components within normal limits  CBG MONITORING, ED - Abnormal; Notable for the following components:   Glucose-Capillary 117 (*)    All other components within normal limits  CBC  CBG MONITORING, ED  I-STAT BETA HCG BLOOD, ED ([facility], [facility], [facility] ONLY)    EKG None  Radiology Ct Head Wo Contrast  Result Date: 09/25/2017 CLINICAL DATA:  Migraine headaches. EXAM: CT HEAD WITHOUT CONTRAST TECHNIQUE: Contiguous axial images were obtained from the base of the skull through the vertex without intravenous contrast. COMPARISON:  CT head dated Jun 01, 2011. FINDINGS: Brain: No evidence of acute infarction, hemorrhage, hydrocephalus,  extra-axial collection or mass lesion/mass effect. Vascular: No hyperdense vessel or unexpected calcification. Skull: Normal. Negative for fracture or focal lesion. Sinuses/Orbits: No acute finding. Other: None. IMPRESSION: 1. Normal noncontrast head CT. Electronically Signed   By: Obie Dredge M.D.   On: 09/25/2017 21:39    Procedures Procedures (including critical care time)  Medications Ordered in ED Medications  sodium chloride 0.9 % bolus 1,000 mL (0 mLs Intravenous Stopped 09/26/17 0039)  prochlorperazine (COMPAZINE) injection 10 mg (10 mg Intravenous Given 09/25/17 2157)  diphenhydrAMINE (BENADRYL) injection 25 mg (25 mg Intravenous Given 09/25/17 2157)  dexamethasone (DECADRON) injection 10 mg (10 mg Intravenous Given 09/25/17 2157)     Initial Impression / Assessment and Plan / ED Course  I have reviewed the triage vital signs and the nursing notes.  Pertinent labs & imaging results that were available during my care of the patient were reviewed by me and considered in my medical decision making (see chart for details).  Patient presents for further evaluation of headaches, has been getting right-sided headaches intermittently for the past month, story does sound typical of migraine, pain not improving with Excedrin today, was seen by her PCP and scheduled for outpatient head CT tomorrow.  Notes bump on scalp that is been present for multiple years but she felt as though it was getting bigger, on exam there is a cystic structure on the top of the scalp, no erythema or tenderness to palpation.  I doubt this is contributing to headaches.  Presentation non concerning for [facility], ICH, Meningitis, or temporal arteritis. Pt is afebrile with no focal neuro deficits, nuchal rigidity, or change in vision.  Basic labs ordered from triage which are reassuring, no leukocytosis and normal hemoglobin, no acute electrolyte derangements and normal creatinine, negative pregnancy, urinalysis without signs of  infection.  Will go ahead and do head CT here in the emergency department since she is scheduled for an outpatient basis, no evidence of intracranial abnormalities, on CT, images reviewed by myself and agree with radiologist findings.  Cystic structure seen appears to be confined to the scalp head.  Headache has completely resolved with treatment here in the emergency department.  I do not feel that further imaging is indicated at this time.  Pt is to follow up with  PCP to discuss prophylactic medication. Pt verbalizes understanding and is agreeable with plan to dc.    Final Clinical Impressions(s) / ED Diagnoses   Final diagnoses:  Bad headache  Scalp cyst    ED Discharge Orders    None       Dartha Lodge, [place] 09/26/17 1425    Little, Ambrose Finland, MD 09/29/17 [phone]

## 2017-09-26 ENCOUNTER — Ambulatory Visit (HOSPITAL_COMMUNITY): Payer: Managed Care, Other (non HMO)

## 2017-09-26 LAB — URINALYSIS, ROUTINE W REFLEX MICROSCOPIC
BILIRUBIN URINE: NEGATIVE
Bacteria, UA: NONE SEEN
GLUCOSE, UA: NEGATIVE mg/dL
HGB URINE DIPSTICK: NEGATIVE
KETONES UR: NEGATIVE mg/dL
NITRITE: NEGATIVE
PH: 6 (ref 5.0–8.0)
Protein, ur: NEGATIVE mg/dL
Specific Gravity, Urine: 1.017 (ref 1.005–1.030)

## 2017-09-26 NOTE — Telephone Encounter (Signed)
Left message letting pt know we will f/u with her tomorrow once Dr. Milinda Antisower reviews ER records

## 2017-09-26 NOTE — Telephone Encounter (Signed)
It looks like she was treated with fluids/compazine (anti nausea med similar to what I gave) and decadron (an IV steroid)  Notes from the ED indicated that her headache was gone by the time she left CT was normal  The spot on her scalp looks like a cyst (unrelated to the headache)  I think a taper of prednisone may help a lot -since the decadron did (another steroid)  Please ask what pharmacy and I will send it in.   Hopefully this will break the headache cycle.  Please let her know side effects of prednisone -temporary hyperness/ jittery and hungry  Keep me posted

## 2017-09-26 NOTE — Telephone Encounter (Signed)
The ED provider has not finished their note yet (for me to see all results/plan) - it will generally be done within 24 hours and I want to review it  Let her know - I will review that when ready (should be within 24 hours) and then get back to her  I will cc this back to myself  Thanks

## 2017-09-26 NOTE — Telephone Encounter (Signed)
Called pt and let her know letter ready for pick up.   Pt wanted Dr. Tower to know [facility] to sever head pain. Pt said they did the CT there and it was normal so they advise her to f/u with Dr. Milinda Antisower. Pt declined to schedule a f/u now due to her trying to go back to work but she did want Dr. Milinda Antisower to know and see what she thinks she should do given CT was ok

## 2017-09-27 MED ORDER — PREDNISONE 20 MG PO TABS: ORAL_TABLET | ORAL | 0 refills | Status: DC

## 2017-09-27 NOTE — Telephone Encounter (Signed)
Ref done  Will route to [facility] 

## 2017-09-27 NOTE — Telephone Encounter (Signed)
Pt advise of Dr. Royden Purlower's comments about ER notes.   Pt said that she tried to go to work today and only lasted 2 hrs before she had to come back home. Pt was wearing sunglasses inside the building because of the light sensitivity, she is still having severe HA and feeling lightheaded and now she has a sharp pain in her right ear that comes and goes. Pt is willing to try the prednisone taper ([facility] on file) but wanted to know if she needs to do anything else since HA are not any better

## 2017-09-27 NOTE — Telephone Encounter (Signed)
Pt notified Rx sent and she does agree with neuro referral, [facility], please put referral in

## 2017-09-27 NOTE — Telephone Encounter (Signed)
I sent a px for 60 mg prednisone taper  I also want to do neurology referral for migraine headache if agreeable -let me know

## 2017-09-27 NOTE — Telephone Encounter (Signed)
Called Dr Moises BloodJaffe's office with Shannon Dyer and he will review notes and possibly see the patient tomorrow. Called patient and made her aware that Dr Moises BloodJaffe's office would call her after review.

## 2017-09-27 NOTE — Addendum Note (Signed)
Addended by: Roxy MannsWER, MARNE A on: 09/27/2017 11:57 AM   Modules accepted: Orders

## 2017-09-27 NOTE — Addendum Note (Signed)
Addended by: Roxy MannsWER, Damarian Priola A on: 09/27/2017 12:32 PM   Modules accepted: Orders

## 2017-10-02 ENCOUNTER — Ambulatory Visit: Payer: Self-pay | Admitting: Family Medicine

## 2017-10-02 ENCOUNTER — Emergency Department (HOSPITAL_COMMUNITY)
Admission: EM | Admit: 2017-10-02 | Discharge: 2017-10-02 | Disposition: A | Discharge status: Home / Self Care | Payer: Managed Care, Other (non HMO) | Attending: Emergency Medicine | Admitting: Emergency Medicine

## 2017-10-02 ENCOUNTER — Encounter (HOSPITAL_COMMUNITY): Payer: Self-pay | Admitting: Emergency Medicine

## 2017-10-02 ENCOUNTER — Emergency Department (HOSPITAL_COMMUNITY): Payer: Managed Care, Other (non HMO)

## 2017-10-02 ENCOUNTER — Other Ambulatory Visit: Payer: Self-pay

## 2017-10-02 DIAGNOSIS — Z79899 Other long term (current) drug therapy: Secondary | ICD-10-CM | POA: Insufficient documentation

## 2017-10-02 DIAGNOSIS — R51 Headache: Secondary | ICD-10-CM | POA: Diagnosis present

## 2017-10-02 DIAGNOSIS — G43801 Other migraine, not intractable, with status migrainosus: Secondary | ICD-10-CM | POA: Diagnosis not present

## 2017-10-02 DIAGNOSIS — F17210 Nicotine dependence, cigarettes, uncomplicated: Secondary | ICD-10-CM | POA: Diagnosis not present

## 2017-10-02 HISTORY — DX: Migraine, unspecified, not intractable, without status migrainosus: G43.909

## 2017-10-02 LAB — COMPREHENSIVE METABOLIC PANEL
ALK PHOS: 54 U/L (ref 38–126)
ALT: 13 U/L (ref 0–44)
AST: 19 U/L (ref 15–41)
Albumin: 3.9 g/dL (ref 3.5–5.0)
Anion gap: 12 (ref 5–15)
BUN: 11 mg/dL (ref 6–20)
CHLORIDE: 103 mmol/L (ref 98–111)
CO2: 24 mmol/L (ref 22–32)
Calcium: 9.1 mg/dL (ref 8.9–10.3)
Creatinine, Ser: 0.95 mg/dL (ref 0.44–1.00)
GLUCOSE: 87 mg/dL (ref 70–99)
Potassium: 3.8 mmol/L (ref 3.5–5.1)
Sodium: 139 mmol/L (ref 135–145)
Total Bilirubin: 0.6 mg/dL (ref 0.3–1.2)
Total Protein: 7.3 g/dL (ref 6.5–8.1)

## 2017-10-02 LAB — CBC
HCT: 42.8 % (ref 36.0–46.0)
HEMOGLOBIN: 15.1 g/dL — AB (ref 12.0–15.0)
MCH: 34.1 pg — AB (ref 26.0–34.0)
MCHC: 35.3 g/dL (ref 30.0–36.0)
MCV: 96.6 fL (ref 78.0–100.0)
Platelets: 362 10*3/uL (ref 150–400)
RBC: 4.43 MIL/uL (ref 3.87–5.11)
RDW: 13.1 % (ref 11.5–15.5)
WBC: 15.3 10*3/uL — ABNORMAL HIGH (ref 4.0–10.5)

## 2017-10-02 MED ORDER — PROCHLORPERAZINE EDISYLATE 10 MG/2ML IJ SOLN
10.0000 mg | Freq: Once | INTRAMUSCULAR | Status: AC
  Administered 2017-10-02: 10 mg via INTRAVENOUS
  Filled 2017-10-02: qty 2

## 2017-10-02 MED ORDER — SODIUM CHLORIDE 0.9 % IV BOLUS
1000.0000 mL | Freq: Once | INTRAVENOUS | Status: AC
  Administered 2017-10-02: 1000 mL via INTRAVENOUS

## 2017-10-02 NOTE — ED Provider Notes (Signed)
[facility]-EMERGENCY DEPT Provider Note   CSN: [id number] Arrival date & time: 10/02/17  4098     History   Chief Complaint Chief Complaint  Patient presents with  . Migraine    HPI Shannon Dyer is a 38 y.o. female.  HPI  This is a 37 year old female comes in today complaining of migraine headache.  She states her headache started 10 days ago on Friday.  It was mild and worsened on Saturday.  It is mostly behind her right eye.  It is severe in nature.  She is having some pain down into her neck now.  She was seen by her primary care doctor and started on prednisone, and Phenergan.  And not having relief from this.  She has some nausea associated with this.  She has not had fever or injury.  She states she has not had a headache that has lasted this long before.  In the past she has had headaches that she identifies as tension type headaches.  She has taken over-the-counter medications without relief.  Past Medical History:  Diagnosis Date  . Migraines   . Panic attacks   . Tobacco abuse     Patient Active Problem List   Diagnosis Date Noted  . Headache 09/24/2017  . Dizziness 09/24/2017  . Skin lesion 09/24/2017  . Smoker 08/11/2016  . 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 Dyspepsia 02/14/2016  . Myalgia 11/21/2012  . Encounter for routine gynecological examination 05/22/2012  . 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 BACK PAIN 02/16/2010  . ANXIETY DEPRESSION 06/30/2008  . GANGLION CYST, WRIST, LEFT 07/25/2007    Past Surgical History:  Procedure Laterality Date  . cystic ovarian mass  01/2005  . ELBOW LIGAMENT RECONSTRUCTION       OB History    Gravida  0   Para      Term      Preterm      AB      Living        SAB      TAB      Ectopic      Multiple      Live Births               Home Medications    Prior to Admission medications   Medication Sig Start Date End Date Taking? Authorizing Provider  DULoxetine  (CYMBALTA) 60 MG capsule Take 60 mg by mouth daily.  02/09/17  Yes [provider]  predniSONE (DELTASONE) 10 MG tablet Take 10 mg by mouth daily.   Yes [provider]  promethazine (PHENERGAN) 25 MG tablet Take 1 tablet (25 mg total) by mouth every 8 (eight) hours as needed for nausea or vomiting (dizziness). Caution of sedation 09/24/17  Yes Tower, Audrie Gallus, MD  traZODone (DESYREL) 100 MG tablet Take 100-200 mg by mouth at bedtime as needed for sleep.  02/03/17  Yes [provider]  ondansetron (ZOFRAN) 8 MG tablet Take 1 tablet (8 mg total) by mouth every 8 (eight) hours as needed for nausea or vomiting. Patient not taking: Reported on 07/06/2017 02/27/17   Excell Seltzer, MD    Family History Family History  Problem Relation Age of Onset  . Hypertension Father 32       died of unknown causes-autopsy    Social History Social History   Tobacco Use  . Smoking status: Current Every Day Smoker    Packs/day: 0.50    Years: 7.00  Pack years: 3.50    Types: Cigarettes  . Smokeless tobacco: Never Used  Substance Use Topics  . Alcohol use: Yes    Alcohol/week: 0.0 standard drinks    Comment: occasionally  . Drug use: No     Allergies   Patient has no known allergies.   Review of Systems Review of Systems  All other systems reviewed and are negative.    Physical Exam Updated Vital Signs BP (!) 126/91   Pulse 87   Temp 97.7 F (36.5 C) (Oral)   Resp 16   Ht 1.6 m (5\' 3" )   Wt 68.4 kg   LMP 10/02/2017 (Exact Date)   SpO2 100%   BMI 26.71 kg/m   Physical Exam  Constitutional: She is oriented to person, place, and time. She appears well-developed and well-nourished.  HENT:  Head: Normocephalic and atraumatic.  Right Ear: External ear normal.  Left Ear: External ear normal.  Nose: Nose normal.  Mouth/Throat: Oropharynx is clear and moist.  Eyes: Pupils are equal, round, and reactive to light. Conjunctivae and EOM are normal.  Neck: Normal  range of motion. Neck supple.  Cardiovascular: Normal rate, regular rhythm, normal heart sounds and intact distal pulses.  Pulmonary/Chest: Effort normal and breath sounds normal.  Abdominal: Soft. Bowel sounds are normal.  Musculoskeletal: Normal range of motion.  Neurological: She is alert and oriented to person, place, and time. She has normal reflexes. She displays normal reflexes. No cranial nerve deficit or sensory deficit. She exhibits normal muscle tone. Coordination normal.  Patient is ambulatory with normal gait   Skin: Skin is warm and dry. Capillary refill takes less than 2 seconds.  Psychiatric: She has a normal mood and affect. Her behavior is normal. Judgment and thought content normal.  Nursing note and vitals reviewed.    ED Treatments / Results  Labs (all labs ordered are listed, but only abnormal results are displayed) Labs Reviewed  CBC - Abnormal; Notable for the following components:      Result Value   WBC 15.3 (*)    Hemoglobin 15.1 (*)    MCH 34.1 (*)    All other components within normal limits  COMPREHENSIVE METABOLIC PANEL    EKG None  Radiology Ct Head Wo Contrast  Result Date: 10/02/2017 CLINICAL DATA:  Severe headache for several days.  Photophobia. EXAM: CT HEAD WITHOUT CONTRAST TECHNIQUE: Contiguous axial images were obtained from the base of the skull through the vertex without intravenous contrast. COMPARISON:  September 25, 2017 FINDINGS: Brain: The ventricles are normal in size and configuration. There is no intracranial mass, hemorrhage, extra-axial fluid collection, or midline shift. The gray-white compartments are normal. No acute infarct evident. Vascular: No hyperdense vessel. No appreciable vascular calcification. Skull: Bony calvarium appears intact. Sinuses/Orbits: Paranasal sinuses which are visualized are clear. Visualized orbits appear symmetric bilaterally. Other: Mastoid air cells are clear. There is an apparent partially calcified  sebaceous cyst on the right near the frontal-parietal junction superiorly measuring 1.1 x 1.1 cm. IMPRESSION: Sebaceous cyst on the right superiorly. Study otherwise unremarkable. Electronically Signed   By: Bretta Bang III M.D.   On: 10/02/2017 10:06    Procedures Procedures (including critical care time)  Medications Ordered in ED Medications  sodium chloride 0.9 % bolus 1,000 mL (0 mLs Intravenous Stopped 10/02/17 1124)  prochlorperazine (COMPAZINE) injection 10 mg (10 mg Intravenous Given 10/02/17 1020)     Initial Impression / Assessment and Plan / ED Course  I have reviewed the triage  vital signs and the nursing notes.  Pertinent labs & imaging results that were available during my care of the patient were reviewed by me and considered in my medical decision making (see chart for details).    37 year old female with new type of headache.  Evaluation here reveals a normal neurological exam.  She is given IV fluids and 10 mg Compazine with resolution of her headache.  Head CT obtained shows no evidence of acute abnormality.  Sebaceous cyst was noted.  I discussed results with patient.  She is discharged home in improved condition.  She does have a mild leukocytosis which I suspect is secondary to her steroids.  She has no other signs or symptoms of infection.  She has some neck pain but neck was supple on exam and there have been no fevers.  All symptoms have resolved with Compazine.  Final Clinical Impressions(s) / ED Diagnoses   Final diagnoses:  Other migraine with status migrainosus, not intractable    ED Discharge Orders    None       Margarita Grizzle, MD 10/02/17 1202

## 2017-10-02 NOTE — ED Triage Notes (Signed)
Per pt, states she has had a migraine since last Sunday-was seen by PCP and in ED last Tuesday-cant see neuro until October-states headache has go to back of head and causing pain to neck, back and radiating down arms-states her body feels "bruised"-sensitive to light

## 2017-10-02 NOTE — Telephone Encounter (Signed)
I saw the ED note- evidently the prednisone is not helping I want to refer her to neurology-please ask if agreeable to that to help with headaches

## 2017-10-02 NOTE — Telephone Encounter (Signed)
You have already referred pt to neuro, pt has appt scheduled on 11/21/17

## 2017-10-02 NOTE — Telephone Encounter (Signed)
Left VM requesting pt to call the office back, CRM created 

## 2017-10-02 NOTE — Telephone Encounter (Signed)
While we are waiting for an appt - I would like to try a migraine preventative medicine (gabapentin)  I would start with pm dosing and then move up to twice daily  If may cause some sedation initially- usually this goes away (but it may help sleep) I do not think she has been on it before   Let me know if she would like to try this and which pharmacy

## 2017-10-02 NOTE — Telephone Encounter (Signed)
Per chart review pt is at [facility] ED.

## 2017-10-02 NOTE — ED Notes (Signed)
Patient given po fluids per her request.

## 2017-10-02 NOTE — Telephone Encounter (Signed)
She called in c/o having a migraine headache since last Monday.   I went to the ED  Last week for a migraine too. While triaging her she decided to go to the ED instead of the office.   I let her know to call us back if that did not work out for her.   She verbalized understanding and is going to the ED. Reason for Disposition . [1] SEVERE headache (e.g., excruciating) AND [2] not improved after 2 hours of pain medicine  Answer Assessment - Initial Assessment Questions 1. LOCATION: "Where does it hurt?"      Had a migraine since last Monday.   I've taken Excedrin  this morning.     2. ONSET: "When did the headache start?" (Minutes, hours or days)      I went to the ED last Tuesday for a migraine.    While triaging her she decided she would go to the ED. 3. PATTERN: "Does the pain come and go, or has it been constant since it started?"     See above.    She has decided to go to the ED instead of the office. 4. SEVERITY: "How bad is the pain?" and "What does it keep you from doing?"  (e.g., Scale 1-10; mild, moderate, or severe)   - MILD (1-3): doesn't interfere with normal activities    - MODERATE (4-7): interferes with normal activities or awakens from sleep    - SEVERE (8-10): excruciating pain, unable to do any normal activities        Severe. 5. RECURRENT SYMPTOM: "Have you ever had headaches before?" If so, ask: "When was the last time?" and "What happened that time?"      Yes 6. CAUSE: "What do you think is causing the headache?"     Migraine 7. MIGRAINE: "Have you been diagnosed with migraine headaches?" If so, ask: "Is this headache similar?"      Yes 8. HEAD INJURY: "Has there been any recent injury to the head?"      *No Answer* 9. OTHER SYMPTOMS: "Do you have any other symptoms?" (fever, stiff neck, eye pain, sore throat, cold symptoms)     *No Answer* 10. PREGNANCY: "Is there any chance you are pregnant?" "When was your last menstrual period?"       *No Answer*  Protocols  used: HEADACHE-A-AH

## 2017-10-02 NOTE — Discharge Instructions (Addendum)
Please follow-up with your doctor for further referral if continued problems with headaches Return here if you have worsening symptoms especially numbness, weakness, or fevers

## 2017-10-03 MED ORDER — GABAPENTIN 300 MG PO CAPS: 300.0000 mg | ORAL_CAPSULE | Freq: Two times a day (BID) | ORAL | 5 refills | Status: DC

## 2017-10-03 NOTE — Telephone Encounter (Signed)
Pt notified Rx sent and advise of Dr. Tower's instructions and verbalized understanding  

## 2017-10-03 NOTE — Telephone Encounter (Signed)
The px is going to say take 1 pill twice daily   Tell her to start with one pill at bedtime for 2 weeks  Then if doing well /tolerating it she can add a 2nd dose in the am   At first this may make her sleepy or dizzy- this usually gets better fairly quickly (most folks get used to it) If not or any intolerable side effects please let me know (or if it changes her mood)     It will take a while to work for headache prevention as well  Please keep me posted

## 2017-10-03 NOTE — Addendum Note (Signed)
Addended by: Roxy Manns A on: 10/03/2017 04:49 PM   Modules accepted: Orders

## 2017-10-03 NOTE — Telephone Encounter (Signed)
Spoke with patient and advised as below. Patient does want to try Gabapentin. She uses [facility]. Please advise on how many nights she is suppose to take medication for pm only dose before moving up to twice daily on the RX. Patient aware to check with pharmacy later on and to update [place] with any problems or concerns that may Bufford Spikes, RMA

## 2017-11-12 ENCOUNTER — Ambulatory Visit (INDEPENDENT_AMBULATORY_CARE_PROVIDER_SITE_OTHER): Payer: Managed Care, Other (non HMO) | Admitting: Family Medicine

## 2017-11-12 ENCOUNTER — Ambulatory Visit: Payer: Managed Care, Other (non HMO) | Admitting: Family Medicine

## 2017-11-12 ENCOUNTER — Encounter (INDEPENDENT_AMBULATORY_CARE_PROVIDER_SITE_OTHER): Payer: Self-pay

## 2017-11-12 ENCOUNTER — Encounter: Payer: Self-pay | Admitting: Family Medicine

## 2017-11-12 VITALS — BP 102/64 | HR 85 | Temp 97.5°F | Ht 63.0 in | Wt 156.5 lb

## 2017-11-12 DIAGNOSIS — J069 Acute upper respiratory infection, unspecified: Secondary | ICD-10-CM

## 2017-11-12 DIAGNOSIS — J208 Acute bronchitis due to other specified organisms: Secondary | ICD-10-CM

## 2017-11-12 DIAGNOSIS — B9689 Other specified bacterial agents as the cause of diseases classified elsewhere: Secondary | ICD-10-CM | POA: Insufficient documentation

## 2017-11-12 DIAGNOSIS — B9789 Other viral agents as the cause of diseases classified elsewhere: Secondary | ICD-10-CM | POA: Diagnosis not present

## 2017-11-12 MED ORDER — PROMETHAZINE-DM 6.25-15 MG/5ML PO SYRP: 5.0000 mL | ORAL_SOLUTION | Freq: Every evening | ORAL | 0 refills | Status: DC | PRN

## 2017-11-12 MED ORDER — BENZONATATE 200 MG PO CAPS: 200.0000 mg | ORAL_CAPSULE | Freq: Three times a day (TID) | ORAL | 1 refills | Status: DC | PRN

## 2017-11-12 NOTE — Progress Notes (Signed)
Subjective:    Patient ID: Shannon Dyer, female    DOB: 04/12/1980, 37 y.o.   MRN: [id number]  HPI  Here for cough and congestion   Started on Saturday  Nausea  Then sneezing and nasal congestion  Nasal d/c is green  Also cough -at times productive (green d/c)  A little sob / perhaps wheeze  No facial pain   Has not had a headache (that finally settled down and sees neuro this month)   Throat is mildly sore No ear pain   Current smoker- still 1/2 ppd trying to cut back   Otc: had theraflu liquid It helped a little bit   No fever/chills/body aches   Patient Active Problem List   Diagnosis Date Noted  . Viral URI with cough 11/12/2017  . Headache 09/24/2017  . Dizziness 09/24/2017  . Skin lesion 09/24/2017  . Smoker 08/11/2016  . 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 Dyspepsia 02/14/2016  . Myalgia 11/21/2012  . Encounter for routine gynecological examination 05/22/2012  . 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 BACK PAIN 02/16/2010  . ANXIETY DEPRESSION 06/30/2008  . GANGLION CYST, WRIST, LEFT 07/25/2007   Past Medical History:  Diagnosis Date  . Migraines   . Panic attacks   . Tobacco abuse    Past Surgical History:  Procedure Laterality Date  . cystic ovarian mass  01/2005  . ELBOW LIGAMENT RECONSTRUCTION     Social History   Tobacco Use  . Smoking status: Current Every Day Smoker    Packs/day: 0.50    Years: 7.00    Pack years: 3.50    Types: Cigarettes  . Smokeless tobacco: Never Used  Substance Use Topics  . Alcohol use: Yes    Alcohol/week: 0.0 standard drinks    Comment: occasionally  . Drug use: No   Family History  Problem Relation Age of Onset  . Hypertension Father 70       died of unknown causes-autopsy   No Known Allergies Current Outpatient Medications on File Prior to Visit  Medication Sig Dispense Refill  . DULoxetine (CYMBALTA) 60 MG capsule Take 60 mg by mouth daily.   0  . gabapentin (NEURONTIN) 300 MG capsule  Take 1 capsule (300 mg total) by mouth 2 (two) times daily. 60 capsule 5  . traZODone (DESYREL) 100 MG tablet Take 100-200 mg by mouth at bedtime as needed for sleep.   1   No current facility-administered medications on file prior to visit.      Review of Systems  Constitutional: Positive for fatigue. Negative for activity change, appetite change, chills, fever and unexpected weight change.  HENT: Positive for congestion, postnasal drip, sneezing and sore throat. Negative for ear pain, mouth sores, rhinorrhea, sinus pressure and voice change.   Eyes: Negative for pain, discharge, redness and visual disturbance.  Respiratory: Positive for cough and wheezing. Negative for shortness of breath and stridor.   Cardiovascular: Negative for chest pain and palpitations.  Gastrointestinal: Negative for abdominal pain, blood in stool, constipation, diarrhea, nausea and vomiting.  Endocrine: Negative for polydipsia and polyuria.  Genitourinary: Negative for dysuria, frequency, hematuria and urgency.  Musculoskeletal: Negative for arthralgias, back pain and myalgias.  Skin: Negative for pallor and rash.  Allergic/Immunologic: Negative for environmental allergies.  Neurological: Negative for dizziness, syncope, weakness, light-headedness and headaches.  Hematological: Negative for adenopathy. Does not bruise/bleed easily.  Psychiatric/Behavioral: Negative for confusion, decreased concentration and dysphoric mood. The patient is not nervous/anxious.  Objective:   Physical Exam  Constitutional: She appears well-developed and well-nourished. No distress.  overwt and well appearing   HENT:  Head: Normocephalic and atraumatic.  Right Ear: External ear normal.  Left Ear: External ear normal.  Mouth/Throat: Oropharynx is clear and moist.  Nares are injected and congested  No sinus tenderness Clear rhinorrhea and post nasal drip   Eyes: Pupils are equal, round, and reactive to light.  Conjunctivae and EOM are normal. Right eye exhibits no discharge. Left eye exhibits no discharge.  Neck: Normal range of motion. Neck supple.  Cardiovascular: Normal rate and normal heart sounds.  Pulmonary/Chest: Effort normal and breath sounds normal. No stridor. No respiratory distress. She has no wheezes. She has no rales. She exhibits no tenderness.  Good air exch  No rales or rhonchi  Lymphadenopathy:    She has no cervical adenopathy.  Neurological: She is alert. No cranial nerve deficit. Coordination normal.  Skin: Skin is warm and dry. No rash noted.  Psychiatric: She has a normal mood and affect.          Assessment & Plan:   Problem List Items Addressed This Visit      Respiratory   Viral URI with cough - Primary    With reassuring exam  Disc symptomatic care - see instructions on AVS  Px tessalon for cough tid and prometh DM for pm (caution of sedation) Fluids/rest and antihistamine prn  Update if not starting to improve in a week or if worsening   Handout given

## 2017-11-12 NOTE — Patient Instructions (Addendum)
Drink fluids Get rest when you can  Try to keep cutting down (or quit smoking)   Try tessalon for cough three times daily  prometh -DM for night time   Antihistamine should help nasal symptoms  claritin or zyrtec or allegra over the counter  Tylenol for aches or fever   Update if not starting to improve in a week or if worsening  (especially if wheezing)

## 2017-11-12 NOTE — Assessment & Plan Note (Signed)
With reassuring exam  Disc symptomatic care - see instructions on AVS  Px tessalon for cough tid and prometh DM for pm (caution of sedation) Fluids/rest and antihistamine prn  Update if not starting to improve in a week or if worsening   Handout given

## 2017-11-20 NOTE — Progress Notes (Signed)
NEUROLOGY FOLLOW UP OFFICE NOTE  Shannon Dyer [id number]  HISTORY OF PRESENT ILLNESS: Shannon Dyer is a 37 year old right-handed female with panic attacks who I saw for paresthesias presents today for headache and dizziness.  Onset:  She reports history of "headaches" but developed "migraines" 2 months ago.  Typical headaches were dull, diffuse, non-throbbing.  These "migraines" are: Location:  Right fronting Quality:  Sharp pounding Intensity:  8/10 Aura: No  Prodrome: No Postdrome: No Associated symptoms:  Dizziness, lightheadedness, nausea, photophobia, phonophobia.  Once vomiting.  She denies associated visual disturbance or unilateral numbness or weakness. Duration:  She had one lasting 2 1/2 weeks.  Subsequently, occur 2 to 7 days Frequency:  Twice a month (15 days a month) Frequency of abortive medication: None Triggers:  Fluorescent lights Relieving factors:  Lay down in dark room Activity:  Aggravates but forces self to function  She has been to the ED for headache on 09/25/17 and 10/02/17.  CT of head performed both visits were personally reviewed and demonstrated no acute intracranial abnormality.    Current NSAIDS: None Current analgesics: None Current triptans: None Current ergotamine: None Current anti-emetic: None Current muscle relaxants: None Current anti-anxiolytic: None Current sleep aide:  trazodone Current Antihypertensive medications: None Current Antidepressant medications:  Cymbalta 60mg  daily Current Anticonvulsant medications:  Gabapentin 300mg  daily as needed (for abortive therapy) Current anti-CGRP: None Current Vitamins/Herbal/Supplements: None Current Antihistamines/Decongestants: None Other therapy: None Hormone/birth control: None  Past NSAIDS:  Ibuprofen, Aleve Past analgesics:  Excedrin Migraine, Tylenol Past abortive triptans: None Past abortive ergotamine: None Past muscle relaxants: None Past anti-emetic: None Past  antihypertensive medications: None Past antidepressant medications:  Venlafaxine XR 37.5mg  Past anticonvulsant medications: None Past anti-CGRP: None Past vitamins/Herbal/Supplements: None Past antihistamines/decongestants: None Other past therapies: None  Caffeine:  1/2 cup coffee daily.  1 Mt Dew daily Smoker:  Less than 1/2ppd.  Currently trying to quit. Diet:  4 16oz bottles of water daily.  Not a big appetite.  Snack during work and then dinner. Exercise:  Walks 15 miles a day Depression: okay; Anxiety:  no Other pain:  no Sleep hygiene:  Off.  Problems falling asleep and staying asleep. Family history of headache:  No  10/02/17 CMP:  Na 139, K 3.8, Cl 103, CO2 24, glucose 87, BUN 11, Cr 0.95, t bili 0.6, ALP 54, AST 19, ALT 13.  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  Degenerative disc disease at L4-L5 noted but no associated spinal stenosis or neural impingement.  NCV-EMG of lower extremities recommended.   PAST MEDICAL HISTORY: Past Medical History:  Diagnosis Date  . Migraines   . Panic attacks   . Tobacco abuse     MEDICATIONS: Current Outpatient Medications on File Prior to Visit  Medication Sig Dispense Refill  . benzonatate (TESSALON) 200 MG capsule Take 1 capsule (200 mg total) by mouth 3 (three) times daily as needed. Do not bite pill 30 capsule 1  . DULoxetine (CYMBALTA) 60 MG capsule Take 60 mg by mouth daily.   0  . gabapentin (NEURONTIN) 300 MG capsule Take 1 capsule (300 mg total) by mouth 2 (two) times daily. 60 capsule 5  . promethazine-dextromethorphan (PROMETHAZINE-DM) 6.25-15 MG/5ML syrup Take 5 mLs by mouth at bedtime as needed for cough. 118 mL 0  . traZODone (DESYREL) 100 MG tablet Take 100-200 mg by mouth  at bedtime as needed for sleep.   1   No current  facility-administered medications on file prior to visit.     ALLERGIES: No Known Allergies  FAMILY HISTORY: Family History  Problem Relation Age of Onset  . Hypertension Father 60       died of unknown causes-autopsy   SOCIAL HISTORY: Social History   Socioeconomic History  . Marital status: Married    Spouse name: Shannon Dyer  . Number of children: 0  . Years of education: Not on file  . Highest education level: 12th grade  Occupational History    Employer: [employer]  Social Needs  . Financial resource strain: Not on file  . Food insecurity:    Worry: Not on file    Inability: Not on file  . Transportation needs:    Medical: Not on file    Non-medical: Not on file  Tobacco Use  . Smoking status: Current Every Day Smoker    Packs/day: 0.50    Years: 7.00    Pack years: 3.50    Types: Cigarettes  . Smokeless tobacco: Never Used  Substance and Sexual Activity  . Alcohol use: Yes    Alcohol/week: 0.0 standard drinks    Comment: occasionally  . Drug use: No  . Sexual activity: Yes    Birth control/protection: None  Lifestyle  . Physical activity:    Days per week: Not on file    Minutes per session: Not on file  . Stress: Not on file  Relationships  . Social connections:    Talks on phone: Not on file    Gets together: Not on file    Attends religious service: Not on file    Active member of club or organization: Not on file    Attends meetings of clubs or organizations: Not on file    Relationship status: Not on file  . Intimate partner violence:    Fear of current or ex partner: Not on file    Emotionally abused: Not on file    Physically abused: Not on file    Forced sexual activity: Not on file  Other Topics Concern  . Not on file  Social History Narrative   Patient is right-handed, though states she can use either. She lives wirth her husband in a 1 story house, 5 steps to enter. She drinks I cup of coffee and 1 soda a day. She walks several miles a day a  work.    REVIEW OF SYSTEMS: Constitutional: No fevers, chills, or sweats, no generalized fatigue, change in appetite Eyes: No visual changes, double vision, eye pain Ear, nose and throat: No hearing loss, ear pain, nasal congestion, sore throat Cardiovascular: No chest pain, palpitations Respiratory:  No shortness of breath at rest or with exertion, wheezes GastrointestinaI: No nausea, vomiting, diarrhea, abdominal pain, fecal incontinence Genitourinary:  No dysuria, urinary retention or frequency Musculoskeletal:  No neck pain, back pain Integumentary: No rash, pruritus, skin lesions Neurological: as above Psychiatric: No depression, insomnia, anxiety Endocrine: No palpitations, fatigue, diaphoresis, mood swings, change in appetite, change in weight, increased thirst Hematologic/Lymphatic:  No purpura, petechiae. Allergic/Immunologic: no itchy/runny eyes, nasal congestion, recent allergic reactions, rashes  PHYSICAL EXAM: Blood pressure 96/74, pulse 82, height 5\' 3"  (1.6 m), weight 157 lb (71.2 kg), last menstrual period 10/29/2017, SpO2 (!) 77 %. General: No acute distress.  Patient appears well-groomed.  Head:  Normocephalic/atraumatic Eyes:  Fundi examined but not visualized Neck: supple, no paraspinal tenderness, full range of  motion Heart:  Regular rate and rhythm Lungs:  Clear to auscultation bilaterally Back: No paraspinal tenderness Neurological Exam: alert and oriented to person, place, and time. Attention span and concentration intact, recent and remote memory intact, fund of knowledge intact.  Speech fluent and not dysarthric, language intact.  CN II-XII intact. Bulk and tone normal, muscle strength 5/5 throughout.  Sensation to light touch, temperature and vibration intact.  Deep tendon reflexes 2+ throughout, toes downgoing.  Finger to nose and heel to shin testing intact.  Gait normal, Romberg negative.  IMPRESSION: 1.  Migraine without aura, with status migrainosus,  intractable 2.  Intermittent bilateral lower extremity paresthesias 3.  Tobacco use disorder  PLAN: 1. She will stop gabapentin.  For preventative management, start topiramate 50 mg at bedtime.  Side effects discussed, including paresthesias.  Advised to take precautions not to get pregnant due to potential teratogenic effects.  If headaches not improved in 4 weeks, she is to contact us so we can increase the dose of topiramate to 100 mg at bedtime. 2.  For abortive therapy, sumatriptan 100 mg with naproxen 500 mg. 3.  Limit use of pain relievers to no more than 2 days out of week to prevent risk of rebound or medication-overuse headache. 4.  Keep headache diary 5.  Exercise, hydration, caffeine cessation, sleep hygiene, monitor for and avoid triggers 6.  Consider:  magnesium citrate 400mg  daily, riboflavin 400mg  daily, and coenzyme Q10 100mg  three times daily 7.  NCV-EMG of lower extremities to further evaluate paresthesias in legs.  8.  Tobacco cessation counseling (CPT 99406):  Tobacco use with no history of CAD, stroke, or cancer  - Currently smoking less than 1/2 packs/day   - Patient was informed of the dangers of tobacco abuse including stroke, cancer, and MI, as well as benefits of tobacco cessation. - Patient is willing to quit at this time. - Approximately 5 mins were spent counseling patient cessation techniques. We discussed various methods to help quit smoking, including deciding on a date to quit, joining a support group, pharmacological agents- nicotine gum/patch/lozenges, chantix.  - I will reassess her progress at the next follow-up visit 9.  Follow up in 3 to 4 months.  Shon Millet, DO  CC:  Roxy Manns, MD

## 2017-11-21 ENCOUNTER — Encounter

## 2017-11-21 ENCOUNTER — Encounter: Payer: Self-pay | Admitting: Neurology

## 2017-11-21 ENCOUNTER — Ambulatory Visit (INDEPENDENT_AMBULATORY_CARE_PROVIDER_SITE_OTHER): Payer: Managed Care, Other (non HMO) | Admitting: Neurology

## 2017-11-21 VITALS — BP 96/74 | HR 82 | Ht 63.0 in | Wt 157.0 lb

## 2017-11-21 DIAGNOSIS — R202 Paresthesia of skin: Secondary | ICD-10-CM

## 2017-11-21 DIAGNOSIS — G43011 Migraine without aura, intractable, with status migrainosus: Secondary | ICD-10-CM

## 2017-11-21 DIAGNOSIS — F17200 Nicotine dependence, unspecified, uncomplicated: Secondary | ICD-10-CM

## 2017-11-21 MED ORDER — SUMATRIPTAN SUCCINATE 100 MG PO TABS: ORAL_TABLET | ORAL | 3 refills | Status: DC

## 2017-11-21 MED ORDER — NAPROXEN 500 MG PO TABS: 500.0000 mg | ORAL_TABLET | Freq: Two times a day (BID) | ORAL | 3 refills | Status: DC | PRN

## 2017-11-21 MED ORDER — TOPIRAMATE 50 MG PO TABS: 50.0000 mg | ORAL_TABLET | Freq: Every day | ORAL | 3 refills | Status: DC

## 2017-11-21 NOTE — Patient Instructions (Addendum)
Migraine Recommendations: 1.  Start topiramate 50mg  at bedtime.  Contact me in 4 weeks with update and we can adjust dose if needed. 2.  Take sumatriptan 100mg  with naproxen 500mg  at earliest onset of headache.  May repeat dose once in 2 hours if needed.  Do not exceed two doses in 24 hours.  STOP GABAPENTIN. 3.  Limit use of pain relievers to no more than 2 days out of the week.  These medications include acetaminophen, ibuprofen, triptans and narcotics.  This will help reduce risk of rebound headaches. 4.  Be aware of common food triggers such as processed sweets, processed foods with nitrites (such as deli meat, hot dogs, sausages), foods with MSG, alcohol (such as wine), chocolate, certain cheeses, certain fruits (dried fruits, bananas, pineapple), vinegar, diet soda. 4.  Avoid caffeine 5.  Routine exercise 6.  Proper sleep hygiene 7.  Stay adequately hydrated with water 8.  Keep a headache diary. 9.  Maintain proper stress management. 10.  Do not skip meals. 11.  Consider supplements:  Magnesium citrate 400mg  to 600mg  daily, riboflavin 400mg , Coenzyme Q 10 100mg  three times daily 12.  We will check nerve study of legs 13.  Follow up in 3 to 4 months.

## 2017-11-26 NOTE — Progress Notes (Signed)
BP 126/80 (BP Location: Left Arm, Patient Position: Sitting, Cuff Size: Normal)   Pulse 97   Temp 97.9 F (36.6 C) (Oral)   Ht 5\' 3"  (1.6 m)   Wt 154 lb 12 oz (70.2 kg)   LMP 10/28/2017   SpO2 100%   BMI 27.41 kg/m    CC: cough Subjective:    Patient ID: Shannon Dyer, female    DOB: 02/27/1980, 37 y.o.   MRN: [id number]  HPI: Shannon Dyer is a 37 y.o. female presenting on 11/27/2017 for Cough (C/o productive cough causing left side neck pain. Started 2-3 wks ago. States she can taste the infection when she coughs up the mucous. Seen and treated recently, not improving. Concerned it may be pneumonia or bronchitis. )   Ongoing productive cough with post tussive emesis, dyspnea with coughing fits. Ongoing for 2-3 wks. 2d ago R neck started hurting with cough - thinks she strained muscle. "Tastes infection" when coughing. Chilled, ST, PNDrainage, HA. Some wheezing and ongoing chest congestion.   No fevers/chills, ear or tooth pain, abd pain, wheezing, head congestion.  No sick contacts at home.  Smoker - working on quitting.  No h/o asthma.   Seen by PCP 2 wks ago with likely viral URI treated with tessalon and phenergan cough syrup. No improvement.  Has also tried theraflu.   Relevant past medical, surgical, family and social history reviewed and updated as indicated. Interim medical history since our last visit reviewed. Allergies and medications reviewed and updated. Outpatient Medications Prior to Visit  Medication Sig Dispense Refill  . naproxen (NAPROSYN) 500 MG tablet Take 1 tablet (500 mg total) by mouth 2 (two) times daily as needed. 20 tablet 3  . SUMAtriptan (IMITREX) 100 MG tablet Take 1 tablet earliest onset of migraine.  May repeat x1 in 2 hours if headache persists or recurs.  Do not exceed 2 tablets in 24h 10 tablet 3  . topiramate (TOPAMAX) 50 MG tablet Take 1 tablet (50 mg total) by mouth at bedtime. 30 tablet 3  . DULoxetine (CYMBALTA) 60 MG capsule Take  60 mg by mouth daily.   0  . traZODone (DESYREL) 100 MG tablet Take 100-200 mg by mouth at bedtime as needed for sleep.   1  . benzonatate (TESSALON) 200 MG capsule Take 1 capsule (200 mg total) by mouth 3 (three) times daily as needed. Do not bite pill (Patient not taking: Reported on 11/21/2017) 30 capsule 1  . promethazine-dextromethorphan (PROMETHAZINE-DM) 6.25-15 MG/5ML syrup Take 5 mLs by mouth at bedtime as needed for cough. (Patient not taking: Reported on 11/21/2017) 118 mL 0   No facility-administered medications prior to visit.      Per HPI unless specifically indicated in ROS section below Review of Systems     Objective:    BP 126/80 (BP Location: Left Arm, Patient Position: Sitting, Cuff Size: Normal)   Pulse 97   Temp 97.9 F (36.6 C) (Oral)   Ht 5\' 3"  (1.6 m)   Wt 154 lb 12 oz (70.2 kg)   LMP 10/28/2017   SpO2 100%   BMI 27.41 kg/m   Wt Readings from Last 3 Encounters:  11/27/17 154 lb 12 oz (70.2 kg)  11/21/17 157 lb (71.2 kg)  11/12/17 156 lb 8 oz (71 kg)    Physical Exam  Constitutional: She appears well-developed and well-nourished. No distress.  HENT:  Head: Normocephalic and atraumatic.  Right Ear: Hearing, tympanic membrane, external ear and ear canal normal.  Left  Ear: Hearing, tympanic membrane, external ear and ear canal normal.  Nose: No mucosal edema or rhinorrhea. Right sinus exhibits no maxillary sinus tenderness and no frontal sinus tenderness. Left sinus exhibits no maxillary sinus tenderness and no frontal sinus tenderness.  Mouth/Throat: Uvula is midline, oropharynx is clear and moist and mucous membranes are normal. No oropharyngeal exudate, posterior oropharyngeal edema, posterior oropharyngeal erythema or tonsillar abscesses.  Eyes: Pupils are equal, round, and reactive to light. Conjunctivae and EOM are normal. No scleral icterus.  Neck: Neck supple.  Tender/tight to palpation R paracervical mm and trap muscles Limited ROM of cervical neck  2/2 pain  Cardiovascular: Normal rate, regular rhythm, normal heart sounds and intact distal pulses.  No murmur heard. Lungs clear  Pulmonary/Chest: Effort normal and breath sounds normal. No respiratory distress. She has no wheezes. She has no rales.  Lymphadenopathy:    She has no cervical adenopathy.  Skin: Skin is warm and dry. No rash noted.  Nursing note and vitals reviewed.     Assessment & Plan:   Problem List Items Addressed This Visit    Smoker    Continue to encourage - she is motivated to work towards quitting.       Neck strain, initial encounter    After coughing fits. Supportive care reviewed.       Acute bacterial bronchitis - Primary    Anticipate bacterial infection given duration and progression of symptoms. With chills, post tussive emesis, will cover for atypical infection with zpack antibiotic.  Phenergan cough syrup refilled today.  Red flags to seek further care reviewed. Pt agrees with plan.       Relevant Medications   azithromycin (ZITHROMAX) 250 MG tablet       Meds ordered this encounter  Medications  . azithromycin (ZITHROMAX) 250 MG tablet    Sig: Take two tablets on day one followed by one tablet on days 2-5    Dispense:  6 each    Refill:  0  . promethazine-dextromethorphan (PROMETHAZINE-DM) 6.25-15 MG/5ML syrup    Sig: Take 5 mLs by mouth at bedtime as needed for cough.    Dispense:  118 mL    Refill:  0   No orders of the defined types were placed in this encounter.   Follow up plan: No follow-ups on file.  Eustaquio Boyden, MD

## 2017-11-27 ENCOUNTER — Encounter: Payer: Self-pay | Admitting: Family Medicine

## 2017-11-27 ENCOUNTER — Ambulatory Visit (INDEPENDENT_AMBULATORY_CARE_PROVIDER_SITE_OTHER): Payer: Managed Care, Other (non HMO) | Admitting: Family Medicine

## 2017-11-27 VITALS — BP 126/80 | HR 97 | Temp 97.9°F | Ht 63.0 in | Wt 154.8 lb

## 2017-11-27 DIAGNOSIS — S161XXA Strain of muscle, fascia and tendon at neck level, initial encounter: Secondary | ICD-10-CM

## 2017-11-27 DIAGNOSIS — B9689 Other specified bacterial agents as the cause of diseases classified elsewhere: Secondary | ICD-10-CM | POA: Diagnosis not present

## 2017-11-27 DIAGNOSIS — J208 Acute bronchitis due to other specified organisms: Secondary | ICD-10-CM | POA: Diagnosis not present

## 2017-11-27 DIAGNOSIS — F17200 Nicotine dependence, unspecified, uncomplicated: Secondary | ICD-10-CM

## 2017-11-27 MED ORDER — AZITHROMYCIN 250 MG PO TABS: ORAL_TABLET | ORAL | 0 refills | Status: DC

## 2017-11-27 MED ORDER — PROMETHAZINE-DM 6.25-15 MG/5ML PO SYRP: 5.0000 mL | ORAL_SOLUTION | Freq: Every evening | ORAL | 0 refills | Status: DC | PRN

## 2017-11-27 NOTE — Assessment & Plan Note (Signed)
Continue to encourage - she is motivated to work towards quitting.

## 2017-11-27 NOTE — Assessment & Plan Note (Signed)
After coughing fits. Supportive care reviewed.

## 2017-11-27 NOTE — Patient Instructions (Signed)
You do have bronchitis, likely bacterial at this point. Treat with zpack antibiotic, refilled phenergan cough syrup.  Push fluids and rest. For neck strain - treat with heating pad, gentle stretches, and ibuprofen or naprosyn anti inflammatory. Let us know if fever >101, worsening productive cough, or not improving with treatment.

## 2017-11-27 NOTE — Assessment & Plan Note (Signed)
Anticipate bacterial infection given duration and progression of symptoms. With chills, post tussive emesis, will cover for atypical infection with zpack antibiotic.  Phenergan cough syrup refilled today.  Red flags to seek further care reviewed. Pt agrees with plan.

## 2017-12-11 ENCOUNTER — Encounter: Payer: Self-pay | Admitting: Family Medicine

## 2017-12-11 ENCOUNTER — Ambulatory Visit (INDEPENDENT_AMBULATORY_CARE_PROVIDER_SITE_OTHER): Payer: Managed Care, Other (non HMO) | Admitting: Family Medicine

## 2017-12-11 ENCOUNTER — Ambulatory Visit: Payer: Managed Care, Other (non HMO) | Admitting: Family Medicine

## 2017-12-11 VITALS — BP 112/81 | HR 73 | Ht 63.0 in | Wt 152.0 lb

## 2017-12-11 DIAGNOSIS — N979 Female infertility, unspecified: Secondary | ICD-10-CM

## 2017-12-11 DIAGNOSIS — Z1211 Encounter for screening for malignant neoplasm of colon: Secondary | ICD-10-CM

## 2017-12-11 DIAGNOSIS — Z124 Encounter for screening for malignant neoplasm of cervix: Secondary | ICD-10-CM

## 2017-12-11 DIAGNOSIS — G43909 Migraine, unspecified, not intractable, without status migrainosus: Secondary | ICD-10-CM | POA: Insufficient documentation

## 2017-12-11 DIAGNOSIS — Z01419 Encounter for gynecological examination (general) (routine) without abnormal findings: Secondary | ICD-10-CM

## 2017-12-11 DIAGNOSIS — G43009 Migraine without aura, not intractable, without status migrainosus: Secondary | ICD-10-CM

## 2017-12-11 NOTE — Progress Notes (Signed)
  Subjective:     Shannon Dyer is a 37 y.o. female and is here for a comprehensive physical exam. The patient reports problems - infertility, normal blood work previously. New partner, has 2 kids of his own. Has never achieved pregnancy. Has regular cycles, come monthly last 2-3 days. Has had left ovarian cystectomy.  The following portions of the patient's history were reviewed and updated as appropriate: allergies, current medications, past family history, past medical history, past social history, past surgical history and problem list.  Review of Systems Pertinent items noted in HPI and remainder of comprehensive ROS otherwise negative.   Objective:    BP 112/81   Pulse 73   Ht 5\' 3"  (1.6 m)   Wt 152 lb (68.9 kg)   LMP 11/26/2017 (Exact Date)   BMI 26.93 kg/m  General appearance: alert, cooperative and appears stated age Head: Normocephalic, without obvious abnormality, atraumatic Neck: no adenopathy, supple, symmetrical, trachea midline and thyroid not enlarged, symmetric, no tenderness/mass/nodules Lungs: clear to auscultation bilaterally Breasts: normal appearance, no masses or tenderness Heart: regular rate and rhythm, S1, S2 normal, no murmur, click, rub or gallop Abdomen: soft, non-tender; bowel sounds normal; no masses,  no organomegaly Pelvic: cervix normal in appearance, external genitalia normal, no adnexal masses or tenderness, no cervical motion tenderness, uterus normal size, shape, and consistency and vagina normal without discharge Extremities: extremities normal, atraumatic, no cyanosis or edema Pulses: 2+ and symmetric Skin: Skin color, texture, turgor normal. No rashes or lesions Lymph nodes: Cervical, supraclavicular, and axillary nodes normal. Neurologic: Grossly normal    Assessment:    Healthy female exam.      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    See After Visit Summary for Counseling Recommendations

## 2017-12-11 NOTE — Patient Instructions (Signed)
Preventive Care 18-39 Years, Female Preventive care refers to lifestyle choices and visits with your health care provider that can promote health and wellness. What does preventive care include?  A yearly physical exam. This is also called an annual well check.  Dental exams once or twice a year.  Routine eye exams. Ask your health care provider how often you should have your eyes checked.  Personal lifestyle choices, including: ? Daily care of your teeth and gums. ? Regular physical activity. ? Eating a healthy diet. ? Avoiding tobacco and drug use. ? Limiting alcohol use. ? Practicing safe sex. ? Taking vitamin and mineral supplements as recommended by your health care provider. What happens during an annual well check? The services and screenings done by your health care provider during your annual well check will depend on your age, overall health, lifestyle risk factors, and family history of disease. Counseling Your health care provider may ask you questions about your:  Alcohol use.  Tobacco use.  Drug use.  Emotional well-being.  Home and relationship well-being.  Sexual activity.  Eating habits.  Work and work Statistician.  Method of birth control.  Menstrual cycle.  Pregnancy history.  Screening You may have the following tests or measurements:  Height, weight, and BMI.  Diabetes screening. This is done by checking your blood sugar (glucose) after you have not eaten for a while (fasting).  Blood pressure.  Lipid and cholesterol levels. These may be checked every 5 years starting at age 38.  Skin check.  Hepatitis C blood test.  Hepatitis B blood test.  Sexually transmitted disease (STD) testing.  BRCA-related cancer screening. This may be done if you have a family history of breast, ovarian, tubal, or peritoneal cancers.  Pelvic exam and Pap test. This may be done every 3 years starting at age 38. Starting at age 30, this may be done  every 5 years if you have a Pap test in combination with an HPV test.  Discuss your test results, treatment options, and if necessary, the need for more tests with your health care provider. Vaccines Your health care provider may recommend certain vaccines, such as:  Influenza vaccine. This is recommended every year.  Tetanus, diphtheria, and acellular pertussis (Tdap, Td) vaccine. You may need a Td booster every 10 years.  Varicella vaccine. You may need this if you have not been vaccinated.  HPV vaccine. If you are 39 or younger, you may need three doses over 6 months.  Measles, mumps, and rubella (MMR) vaccine. You may need at least one dose of MMR. You may also need a second dose.  Pneumococcal 13-valent conjugate (PCV13) vaccine. You may need this if you have certain conditions and were not previously vaccinated.  Pneumococcal polysaccharide (PPSV23) vaccine. You may need one or two doses if you smoke cigarettes or if you have certain conditions.  Meningococcal vaccine. One dose is recommended if you are age 68-21 years and a first-year college student living in a residence hall, or if you have one of several medical conditions. You may also need additional booster doses.  Hepatitis A vaccine. You may need this if you have certain conditions or if you travel or work in places where you may be exposed to hepatitis A.  Hepatitis B vaccine. You may need this if you have certain conditions or if you travel or work in places where you may be exposed to hepatitis B.  Haemophilus influenzae type b (Hib) vaccine. You may need this  if you have certain risk factors.  Talk to your health care provider about which screenings and vaccines you need and how often you need them. This information is not intended to replace advice given to you by your health care provider. Make sure you discuss any questions you have with your health care provider. Document Released: 03/14/2001 Document Revised:  10/06/2015 Document Reviewed: 11/17/2014 Elsevier Interactive Patient Education  2018 Elsevier Inc.  

## 2017-12-11 NOTE — Assessment & Plan Note (Signed)
HSG--if no pregnancy in 3 months--refer to REI

## 2017-12-11 NOTE — Assessment & Plan Note (Signed)
Can see Shannon Dyer if desires

## 2017-12-13 LAB — CYTOLOGY - PAP

## 2017-12-14 ENCOUNTER — Encounter: Payer: Self-pay | Admitting: Radiology

## 2017-12-20 ENCOUNTER — Ambulatory Visit (INDEPENDENT_AMBULATORY_CARE_PROVIDER_SITE_OTHER): Payer: Managed Care, Other (non HMO) | Admitting: Neurology

## 2017-12-20 DIAGNOSIS — R202 Paresthesia of skin: Secondary | ICD-10-CM | POA: Diagnosis not present

## 2017-12-20 NOTE — Procedures (Signed)
[facility]  [address], [address]  [place], [place] [address] Tel: [phone] Fax:  [phone] Test Date:  12/20/2017  Patient: Shannon BarthelChristine Dyer DOB: 1980/04/08 Physician: Nita Sickleonika Patel, DO  Sex: Female Height: 5\' 3"  Ref Phys: Shon MilletAdam Jaffe, DO  ID#: [id number] Temp: 36.0C Technician:    Patient Complaints: This is a 37 year old female referred for evaluation of bilateral leg numbness and tingling.  NCV & EMG Findings: Electrodiagnostic testing of the right lower extremity and additional studies of the left shows: 1. Bilateral sural and superficial peroneal sensory responses are within normal limits. 2. Bilateral peroneal and tibial motor responses are within normal limits. 3. Bilateral tibial H reflex studies are within normal limits. 4. There is no evidence of active or chronic motor axonal changes affecting any of the tested muscles.  Motor unit configuration and recruitment pattern is within normal limits.   Impression: This is a normal study of the lower extremities.  In particular, there is no evidence of a sensorimotor polyneuropathy or lumbosacral radiculopathy.    ___________________________ Nita Sickleonika Patel, DO    Nerve Conduction Studies Anti Sensory Summary Table   Site NR Peak (ms) Norm Peak (ms) P-T Amp (V) Norm P-T Amp  Left Sup Peroneal Anti Sensory (Ant Lat Mall)  36C  12 cm    1.9 <4.5 27.9 >5  Right Sup Peroneal Anti Sensory (Ant Lat Mall)  36C  12 cm    2.2 <4.5 22.5 >5  Left Sural Anti Sensory (Lat Mall)  36C  Calf    2.5 <4.5 29.4 >5  Right Sural Anti Sensory (Lat Mall)  36C  Calf    2.4 <4.5 25.2 >5   Motor Summary Table   Site NR Onset (ms) Norm Onset (ms) O-P Amp (mV) Norm O-P Amp Site1 Site2 Delta-0 (ms) Dist (cm) Vel (m/s) Norm Vel (m/s)  Left Peroneal Motor (Ext Dig Brev)  36C  Ankle    3.4 <5.5 5.1 >3 B Fib Ankle 6.4 33.0 52 >40  B Fib    9.8  4.6  Poplt B Fib 1.0 8.0 80 >40  Poplt    10.8  4.5         Right  Peroneal Motor (Ext Dig Brev)  36C  Ankle    2.3 <5.5 6.0 >3 B Fib Ankle 6.1 35.0 57 >40  B Fib    8.4  6.0  Poplt B Fib 1.1 9.0 82 >40  Poplt    9.5  5.8         Left Tibial Motor (Abd Hall Brev)  36C  Ankle    2.8 <6.0 15.2 >8 Knee Ankle 8.0 36.0 45 >40  Knee    10.8  12.1         Right Tibial Motor (Abd Hall Brev)  36C  Ankle    3.8 <6.0 11.6 >8 Knee Ankle 7.1 36.0 51 >40  Knee    10.9  9.1          H Reflex Studies   NR H-Lat (ms) Lat Norm (ms) L-R H-Lat (ms)  Left Tibial (Gastroc)  36C     28.71 <35 0.00  Right Tibial (Gastroc)  36C     28.71 <35 0.00   EMG   Side Muscle Ins Act Fibs Psw Fasc Number Recrt Dur Dur. Amp Amp. Poly Poly. Comment  Right Gastroc Nml Nml Nml Nml Nml Nml Nml Nml Nml Nml Nml Nml N/A  Right AntTibialis Nml Nml Nml Nml Nml Nml Nml  Nml Nml Nml Nml Nml N/A  Right Flex Dig Long Nml Nml Nml Nml Nml Nml Nml Nml Nml Nml Nml Nml N/A  Right RectFemoris Nml Nml Nml Nml Nml Nml Nml Nml Nml Nml Nml Nml N/A  Right GluteusMed Nml Nml Nml Nml Nml Nml Nml Nml Nml Nml Nml Nml N/A  Left AntTibialis Nml Nml Nml Nml Nml Nml Nml Nml Nml Nml Nml Nml N/A  Left Gastroc Nml Nml Nml Nml Nml Nml Nml Nml Nml Nml Nml Nml N/A  Left Flex Dig Long Nml Nml Nml Nml Nml Nml Nml Nml Nml Nml Nml Nml N/A  Left RectFemoris Nml Nml Nml Nml Nml Nml Nml Nml Nml Nml Nml Nml N/A  Left GluteusMed Nml Nml Nml Nml Nml Nml Nml Nml Nml Nml Nml Nml N/A      Waveforms:

## 2017-12-25 ENCOUNTER — Telehealth: Payer: Self-pay | Admitting: *Deleted

## 2017-12-25 NOTE — Telephone Encounter (Signed)
Patient given results and instructions.  She now has complaint of possible RLS.  She will come in for a follow up to discuss this with Dr. Everlena CooperJaffe.

## 2017-12-25 NOTE — Telephone Encounter (Signed)
-----   Message from Vivien RotaAshley H Renald Haithcock, LPN sent at 21/30/865711/26/2019 11:26 AM EST -----   ----- Message ----- From: Drema DallasJaffe, Adam R, DO Sent: 12/20/2017   5:46 PM EST To: Dorthy CoolerSandra J Burns, CMA  Nerve study normal.  I would like to check B12 level as cause of numbness and tingling.

## 2017-12-25 NOTE — Telephone Encounter (Signed)
-----   Message from Elianna Windom H Selig Wampole, LPN sent at 12/25/2017 11:26 AM EST -----   ----- Message ----- From: Jaffe, Adam R, DO Sent: 12/20/2017   5:46 PM EST To: Sandra J Burns, CMA  Nerve study normal.  I would like to check B12 level as cause of numbness and tingling. 

## 2017-12-30 NOTE — Progress Notes (Signed)
NEUROLOGY FOLLOW UP OFFICE NOTE  Shannon BarthelChristine Buntrock [id number]  HISTORY OF PRESENT ILLNESS: Shannon Dyer is a 37 year old right-handed female who follows up for back pain and leg discomfort.  UPDATE: NCV-EMG of lower extremities from 11/21/17 was normal.  I requested checking B12 level which was not yet performed.  She states continued low back pain and her legs feel painful and figidty.  She notes shin splints in the left leg.    Past medications include gabapentin (ineffective).  She was previously on Cymbalta for anxiety.    HISTORY: She presented to the ED on 03/13/17 for onset of numbness and tingling of the lower extremities from below the knee to the feet in stocking distribution.  It occurred while walking at work.  She has some chronic left lower back pain but she never experienced these symptoms before.  There was no associated radicular pain or weakness down the legs or involvement of the upper extremities.  She denies bowel or bladder dysfunction.  She works at [employer], which requires her to lift heavy packages.  However, she was not lifting at this time.  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  She would experience the paresthesias off and on for the next couple of months.  Symptoms subsided in mid-May.  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  Degenerative disc disease at L4-L5 noted but no associated spinal stenosis or neural impingement.    PAST MEDICAL HISTORY: Past Medical History:  Diagnosis Date  . Migraines   . Panic attacks   . Tobacco abuse     MEDICATIONS: Current Outpatient Medications on File Prior to Visit  Medication Sig Dispense Refill  . naproxen (NAPROSYN) 500 MG tablet Take 1 tablet (500 mg total) by mouth 2 (two) times daily as needed. (Patient not  taking: Reported on 12/11/2017) 20 tablet 3  . SUMAtriptan (IMITREX) 100 MG tablet Take 1 tablet earliest onset of migraine.  May repeat x1 in 2 hours if headache persists or recurs.  Do not exceed 2 tablets in 24h 10 tablet 3  . topiramate (TOPAMAX) 50 MG tablet Take 1 tablet (50 mg total) by mouth at bedtime. 30 tablet 3  . traZODone (DESYREL) 100 MG tablet Take 100-200 mg by mouth at bedtime as needed for sleep.   1   No current facility-administered medications on file prior to visit.     ALLERGIES: No Known Allergies  FAMILY HISTORY: Family History  Problem Relation Age of Onset  . Hypertension Father 1060       died of unknown causes-autopsy  . COPD Father   . Asthma Sister    SOCIAL HISTORY: Social History   Socioeconomic History  . Marital status: Legally Separated    Spouse name: Reita ClicheBobby  . Number of children: 0  . Years of education: Not on file  . Highest education level: 12th grade  Occupational History  . Occupation: Event organisersupervisor    Employer: [employer]  Social Needs  . Financial resource strain: Not on file  . Food insecurity:    Worry: Not on file    Inability: Not on file  . Transportation needs:    Medical: Not on file    Non-medical: Not on file  Tobacco Use  . Smoking status: Current Every Day Smoker    Packs/day: 0.50    Years: 7.00  Pack years: 3.50    Types: Cigarettes  . Smokeless tobacco: Never Used  Substance and Sexual Activity  . Alcohol use: Yes    Alcohol/week: 0.0 standard drinks    Comment: occasionally  . Drug use: No  . Sexual activity: Yes    Birth control/protection: None  Lifestyle  . Physical activity:    Days per week: Not on file    Minutes per session: Not on file  . Stress: Not on file  Relationships  . Social connections:    Talks on phone: Not on file    Gets together: Not on file    Attends religious service: Not on file    Active member of club or organization: Not on file    Attends meetings of clubs or  organizations: Not on file    Relationship status: Not on file  . Intimate partner violence:    Fear of current or ex partner: Not on file    Emotionally abused: Not on file    Physically abused: Not on file    Forced sexual activity: Not on file  Other Topics Concern  . Not on file  Social History Narrative   Patient is right-handed, though states she can use either. She lives wirth her husband in a 1 story house, 5 steps to enter. She drinks I cup of coffee and 1 soda a day. She walks several miles a day a work.      Patient is currently separated from her husband. She lives in a 2 level home now. She drinks one cup of coffee and one soda a day.    REVIEW OF SYSTEMS: Constitutional: No fevers, chills, or sweats, no generalized fatigue, change in appetite Eyes: No visual changes, double vision, eye pain Ear, nose and throat: No hearing loss, ear pain, nasal congestion, sore throat Cardiovascular: No chest pain, palpitations Respiratory:  No shortness of breath at rest or with exertion, wheezes GastrointestinaI: No nausea, vomiting, diarrhea, abdominal pain, fecal incontinence Genitourinary:  No dysuria, urinary retention or frequency Musculoskeletal:  No neck pain, back pain Integumentary: No rash, pruritus, skin lesions Neurological: as above Psychiatric: No depression, insomnia, anxiety Endocrine: No palpitations, fatigue, diaphoresis, mood swings, change in appetite, change in weight, increased thirst Hematologic/Lymphatic:  No purpura, petechiae. Allergic/Immunologic: no itchy/runny eyes, nasal congestion, recent allergic reactions, rashes  PHYSICAL EXAM: Blood pressure 112/82, pulse (!) 101, height 5\' 3"  (1.6 m), weight 152 lb (68.9 kg), SpO2 98 %. General: No acute distress.  Patient appears well-groomed.   IMPRESSION: Low back pain Bilateral leg pain/paresthesias. Unclear etiology.  No evidence of radiculopathy, peripheral neuropathy or myopathy on testing.  PLAN: 1.   Will try Lyrica 50mg  three times daily.   2.  Will check B12 as potential cause of leg paresthesias and discomfort. 3.  Follow up in February as already scheduled.  16 minutes spent face to face with patient, 100% spent discussing diagnosis and plan.  Shon Millet, DO  CC: Roxy Manns, MD

## 2017-12-31 ENCOUNTER — Other Ambulatory Visit (INDEPENDENT_AMBULATORY_CARE_PROVIDER_SITE_OTHER): Payer: Managed Care, Other (non HMO)

## 2017-12-31 ENCOUNTER — Encounter: Payer: Self-pay | Admitting: Neurology

## 2017-12-31 ENCOUNTER — Ambulatory Visit (INDEPENDENT_AMBULATORY_CARE_PROVIDER_SITE_OTHER): Payer: Managed Care, Other (non HMO) | Admitting: Neurology

## 2017-12-31 VITALS — BP 112/82 | HR 101 | Ht 63.0 in | Wt 152.0 lb

## 2017-12-31 DIAGNOSIS — M79605 Pain in left leg: Secondary | ICD-10-CM

## 2017-12-31 DIAGNOSIS — R6889 Other general symptoms and signs: Secondary | ICD-10-CM

## 2017-12-31 DIAGNOSIS — R202 Paresthesia of skin: Secondary | ICD-10-CM | POA: Diagnosis not present

## 2017-12-31 DIAGNOSIS — M79604 Pain in right leg: Secondary | ICD-10-CM

## 2017-12-31 DIAGNOSIS — M545 Low back pain, unspecified: Secondary | ICD-10-CM

## 2017-12-31 LAB — VITAMIN B12: Vitamin B-12: 295 pg/mL (ref 211–911)

## 2017-12-31 MED ORDER — PREGABALIN 50 MG PO CAPS: 50.0000 mg | ORAL_CAPSULE | Freq: Three times a day (TID) | ORAL | 0 refills | Status: DC

## 2017-12-31 NOTE — Patient Instructions (Addendum)
For leg discomfort, start Lyrica 50mg  three times daily.  If effective, contact me for prescription Check B12 for tingling in legs. Follow up in February as scheduled.  Your provider has requested that you have labwork completed today. Please go to [facility] ([id number]) on the second floor of this building before leaving the office today. You do not need to check in. If you are not called within 15 minutes please check with the front desk.

## 2018-01-08 ENCOUNTER — Telehealth: Payer: Self-pay

## 2018-01-08 DIAGNOSIS — E538 Deficiency of other specified B group vitamins: Secondary | ICD-10-CM

## 2018-01-08 NOTE — Telephone Encounter (Signed)
-----   Message from Drema DallasAdam R Jaffe, DO sent at 01/01/2018 10:40 AM EST ----- B12 level is in a low-normal range, which may still present with symptoms of B12 deficiency.  Recommend starting over the counter B12 1000mcg daily.  Repeat level about a week prior to follow up.

## 2018-01-08 NOTE — Telephone Encounter (Signed)
Called and advised Pt of B12 results and to start OTC supplement and to come for repeat labs in February prior to appt.

## 2018-01-14 ENCOUNTER — Encounter: Payer: Self-pay | Admitting: Radiology

## 2018-02-24 ENCOUNTER — Other Ambulatory Visit: Payer: Self-pay

## 2018-02-24 ENCOUNTER — Emergency Department (HOSPITAL_COMMUNITY)
Admission: EM | Admit: 2018-02-24 | Discharge: 2018-02-24 | Disposition: A | Discharge status: Home / Self Care | Payer: No Typology Code available for payment source | Attending: Emergency Medicine | Admitting: Emergency Medicine

## 2018-02-24 ENCOUNTER — Encounter (HOSPITAL_COMMUNITY): Payer: Self-pay

## 2018-02-24 DIAGNOSIS — F17210 Nicotine dependence, cigarettes, uncomplicated: Secondary | ICD-10-CM | POA: Insufficient documentation

## 2018-02-24 DIAGNOSIS — M25521 Pain in right elbow: Secondary | ICD-10-CM | POA: Diagnosis not present

## 2018-02-24 DIAGNOSIS — F419 Anxiety disorder, unspecified: Secondary | ICD-10-CM | POA: Diagnosis not present

## 2018-02-24 DIAGNOSIS — F329 Major depressive disorder, single episode, unspecified: Secondary | ICD-10-CM | POA: Insufficient documentation

## 2018-02-24 DIAGNOSIS — Z79899 Other long term (current) drug therapy: Secondary | ICD-10-CM | POA: Diagnosis not present

## 2018-02-24 MED ORDER — PREDNISONE 10 MG PO TABS: 40.0000 mg | ORAL_TABLET | Freq: Every day | ORAL | 0 refills | Status: DC

## 2018-02-24 MED ORDER — KETOROLAC TROMETHAMINE 60 MG/2ML IM SOLN
30.0000 mg | Freq: Once | INTRAMUSCULAR | Status: AC
  Administered 2018-02-24: 30 mg via INTRAMUSCULAR
  Filled 2018-02-24: qty 2

## 2018-02-24 MED ORDER — OXYCODONE-ACETAMINOPHEN 5-325 MG PO TABS
1.0000 | ORAL_TABLET | Freq: Once | ORAL | Status: AC
  Administered 2018-02-24: 1 via ORAL
  Filled 2018-02-24: qty 1

## 2018-02-24 NOTE — ED Provider Notes (Signed)
[facility]-EMERGENCY DEPT Provider Note   CSN: [id number] Arrival date & time: 02/24/18  1317     History   Chief Complaint Chief Complaint  Patient presents with  . Elbow Pain    HPI Rosey Feria is a 38 y.o. female who presents to the ED with elbow pain due to injury at work last week. Patient was evaluated at Urgent Care last week. She is scheduled for a follow up Worker's Comp appointment in 3 days, however, she has been unable to control the pain so she came here. X-ray of elbow at [facility] was normal. Patient taking ibuprofen, applying ice, elevating the area but pain has worsened.   HPI  Past Medical History:  Diagnosis Date  . Migraines   . Panic attacks   . Tobacco abuse     Patient Active Problem List   Diagnosis Date Noted  . Migraines   . Neck strain, initial encounter 11/27/2017  . Headache 09/24/2017  . Dizziness 09/24/2017  . Skin lesion 09/24/2017  . Smoker 08/11/2016  . 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 Dyspepsia 02/14/2016  . Myalgia 11/21/2012  . Encounter for routine gynecological examination 05/22/2012  . 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 BACK PAIN 02/16/2010  . ANXIETY DEPRESSION 06/30/2008  . GANGLION CYST, WRIST, LEFT 07/25/2007    Past Surgical History:  Procedure Laterality Date  . cystic ovarian mass  01/2005  . ELBOW LIGAMENT RECONSTRUCTION       OB History    Gravida  0   Para      Term      Preterm      AB      Living        SAB      TAB      Ectopic      Multiple      Live Births               Home Medications    Prior to Admission medications   Medication Sig Start Date End Date Taking? Authorizing Provider  naproxen (NAPROSYN) 500 MG tablet Take 1 tablet (500 mg total) by mouth 2 (two) times daily as needed. 11/21/17   Drema Dallas, DO  predniSONE (DELTASONE) 10 MG tablet Take 4 tablets (40 mg total) by mouth daily with breakfast. 02/24/18   Janne Napoleon, NP    pregabalin (LYRICA) 50 MG capsule Take 1 capsule (50 mg total) by mouth 3 (three) times daily. 12/31/17   Drema Dallas, DO  SUMAtriptan (IMITREX) 100 MG tablet Take 1 tablet earliest onset of migraine.  May repeat x1 in 2 hours if headache persists or recurs.  Do not exceed 2 tablets in 24h 11/21/17   Everlena Cooper, Adam R, DO  topiramate (TOPAMAX) 50 MG tablet Take 1 tablet (50 mg total) by mouth at bedtime. 11/21/17   Drema Dallas, DO  traZODone (DESYREL) 100 MG tablet Take 100-200 mg by mouth at bedtime as needed for sleep.  02/03/17   [provider]    Family History Family History  Problem Relation Age of Onset  . Hypertension Father 34       died of unknown causes-autopsy  . COPD Father   . Asthma Sister     Social History Social History   Tobacco Use  . Smoking status: Current Every Day Smoker    Packs/day: 0.50    Years: 7.00    Pack years: 3.50    Types: Cigarettes  .  Smokeless tobacco: Never Used  Substance Use Topics  . Alcohol use: Yes    Alcohol/week: 0.0 standard drinks    Comment: occasionally  . Drug use: No     Allergies   Patient has no known allergies.   Review of Systems Review of Systems  Musculoskeletal: Positive for arthralgias and joint swelling.  All other systems reviewed and are negative.    Physical Exam Updated Vital Signs BP (!) 142/92   Pulse 97   Temp 97.7 F (36.5 C) (Oral)   Resp 16   SpO2 98%   Physical Exam Vitals signs and nursing note reviewed.  Constitutional:      General: She is not in acute distress.    Appearance: She is well-developed.  HENT:     Head: Normocephalic.     Mouth/Throat:     Mouth: Mucous membranes are moist.  Eyes:     Conjunctiva/sclera: Conjunctivae normal.  Neck:     Musculoskeletal: Neck supple.  Cardiovascular:     Rate and Rhythm: Normal rate.  Pulmonary:     Effort: Pulmonary effort is normal.  Musculoskeletal:     Right elbow: She exhibits swelling. She exhibits no deformity  and no laceration. Decreased range of motion: due to pain. Tenderness found.       Arms:     Comments: Radial pulses 2+, adequate circulation. Pain to the posterior aspect of the right elbow with flexion and extension. Muscle tenderness posterior elbow with swelling.   Skin:    General: Skin is warm and dry.  Neurological:     Mental Status: She is alert and oriented to person, place, and time.  Psychiatric:        Mood and Affect: Mood normal.      ED Treatments / Results  Labs (all labs ordered are listed, but only abnormal results are displayed) Labs Reviewed - No data to display  Radiology No results found.  Procedures Procedures (including critical care time)  Medications Ordered in ED Medications  oxyCODONE-acetaminophen (PERCOCET/ROXICET) 5-325 MG per tablet 1 tablet (1 tablet Oral Given 02/24/18 1407)  ketorolac (TORADOL) injection 30 mg (30 mg Intramuscular Given 02/24/18 1408)     Initial Impression / Assessment and Plan / ED Course  I have reviewed the triage vital signs and the nursing notes. 38 y.o. female here with right elbow pain s/p injury last week stable for d/c without focal neuro deficits. Pain managed in the ED. Will give short steroid burst and patient to keep her appointment is week for follow up.   Final Clinical Impressions(s) / ED Diagnoses   Final diagnoses:  Right elbow pain    ED Discharge Orders         Ordered    predniSONE (DELTASONE) 10 MG tablet  Daily with breakfast     02/24/18 1451           Damian Leavell [place], NP 02/24/18 1454    Shaune Pollack, MD 02/25/18 [phone]

## 2018-02-24 NOTE — Discharge Instructions (Addendum)
Continue to take the medications you are taking for pain in addition to the one we prescribe for you. Follow up as scheduled this week.

## 2018-02-24 NOTE — ED Triage Notes (Signed)
Pt reports elbow injury at work last week and was seen at [facility] last Wednesday. Pt has follow-up appointment this Wednesday but reports that she is unable to control pain with OTC medications.

## 2018-03-19 ENCOUNTER — Other Ambulatory Visit: Payer: Self-pay | Admitting: Neurology

## 2018-03-25 NOTE — Progress Notes (Deleted)
NEUROLOGY FOLLOW UP OFFICE NOTE  Shannon Dyer [id number]  HISTORY OF PRESENT ILLNESS: Shannon Dyer is a 38 year old right-handed woman who follows up for low back pain with lower extremity paresthesias and migraines.  UPDATE: Paresthesias/low back pain: B12 from 12/31/17 was 295.  She was advised to start oral daily.  She was prescribed Lyrica 50mg  three times daily.    Migraines: Intensity:  *** Duration:  *** Frequency:  *** Frequency of abortive medication: *** Current NSAIDS: Naproxen (with sumatriptan) Current analgesics: None Current triptans: Sumatriptan 100mg  (with naproxen) Current ergotamine: None Current anti-emetic: None Current muscle relaxants: None Current anti-anxiolytic: None Current sleep aide:  trazodone Current Antihypertensive medications: None Current Antidepressant medications:  None Current Anticonvulsant medications:  topiramate 50mg  at bedtime Current anti-CGRP: None Current Vitamins/Herbal/Supplements: None Current Antihistamines/Decongestants: None Other therapy: None Hormone/birth control: None  Caffeine:  1/2 cup coffee daily.  1 Mt Dew daily Smoker:  Less than 1/2ppd.  Currently trying to quit. Diet:  4 16oz bottles of water daily.  Not a big appetite.  Snack during work and then dinner. Exercise:  Walks 15 miles a day Depression: okay; Anxiety:  no Other pain:  no Sleep hygiene:  Off.  Problems falling asleep and staying asleep.  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 It occurred while walking at work. She has some chronic left lower back pain but she never experienced these symptoms before. There was no associated radicular pain or weakness down the legs or involvement of the upper extremities. She denies bowel or bladder dysfunction. She works at [employer], which requires  her to lift heavy packages. However, she was not lifting at this time. 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 She would experience the paresthesias off and on for the next couple of months. Symptoms subsided in mid-May.  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 Degenerative disc disease at L4-L5 noted but no associated spinal stenosis or neural impingement. NCV-EMG of lower extremities from 11/21/17 was normal.   Past medications include gabapentin (ineffective).  She was previously on Cymbalta for anxiety.    Migraines: Onset:   Longstanding history of headaches but developed new migraines in August 2019.  Typical headaches were dull, diffuse, non-throbbing.  These "migraines" are: Location:  Right fronting Quality:  Sharp pounding Initial intensity:  8/10 Aura: No  Prodrome: No Postdrome: No Associated symptoms: Dizziness, lightheadedness, nausea, photophobia, phonophobia.  Once vomiting.  She denies associated visual disturbance or unilateral numbness or weakness. Initial duration:  She had one lasting 2 1/2 weeks.  Subsequently, occur 2 to 7 days Initial Frequency:  Twice a month (15 days a month) Initial Frequency of abortive medication: None Triggers: Fluorescent lights Relieving factors: Laying down in a dark room Activity:  Aggravates but forces self to function  She has been to the ED for headache on 09/25/17 and 10/02/17.  CT of head performed both visits were personally reviewed and demonstrated no acute intracranial abnormality.    Past NSAIDS:  Ibuprofen, Aleve Past analgesics:  Excedrin Migraine, Tylenol Past abortive triptans: None Past abortive ergotamine: None Past muscle relaxants: None Past anti-emetic: None Past antihypertensive medications: None Past  antidepressant medications:  Venlafaxine XR 37.5mg , Cymbalta 60mg  Past anticonvulsant medications: Gabapentin  300mg  daily as needed (for abortive therapy) Past anti-CGRP: None Past vitamins/Herbal/Supplements: None Past antihistamines/decongestants: None Other past therapies: None  Family history of headache:  No  PAST MEDICAL HISTORY: Past Medical History:  Diagnosis Date  . Migraines   . Panic attacks   . Tobacco abuse     MEDICATIONS: Current Outpatient Medications on File Prior to Visit  Medication Sig Dispense Refill  . naproxen (NAPROSYN) 500 MG tablet Take 1 tablet (500 mg total) by mouth 2 (two) times daily as needed. 20 tablet 3  . predniSONE (DELTASONE) 10 MG tablet Take 4 tablets (40 mg total) by mouth daily with breakfast. 16 tablet 0  . pregabalin (LYRICA) 50 MG capsule Take 1 capsule (50 mg total) by mouth 3 (three) times daily. 21 capsule 0  . SUMAtriptan (IMITREX) 100 MG tablet Take 1 tablet earliest onset of migraine.  May repeat x1 in 2 hours if headache persists or recurs.  Do not exceed 2 tablets in 24h 10 tablet 3  . topiramate (TOPAMAX) 50 MG tablet TAKE 1 TABLET BY MOUTH EVERYDAY AT BEDTIME 30 tablet 5  . traZODone (DESYREL) 100 MG tablet Take 100-200 mg by mouth at bedtime as needed for sleep.   1   No current facility-administered medications on file prior to visit.     ALLERGIES: No Known Allergies  FAMILY HISTORY: Family History  Problem Relation Age of Onset  . Hypertension Father 66       died of unknown causes-autopsy  . COPD Father   . Asthma Sister    SOCIAL HISTORY: Social History   Socioeconomic History  . Marital status: Legally Separated    Spouse name: Reita Cliche  . Number of children: 0  . Years of education: Not on file  . Highest education level: 12th grade  Occupational History  . Occupation: Event organiser: [employer]  Social Needs  . Financial resource strain: Not on file  . Food insecurity:    Worry: Not on file     Inability: Not on file  . Transportation needs:    Medical: Not on file    Non-medical: Not on file  Tobacco Use  . Smoking status: Current Every Day Smoker    Packs/day: 0.50    Years: 7.00    Pack years: 3.50    Types: Cigarettes  . Smokeless tobacco: Never Used  Substance and Sexual Activity  . Alcohol use: Yes    Alcohol/week: 0.0 standard drinks    Comment: occasionally  . Drug use: No  . Sexual activity: Yes    Birth control/protection: None  Lifestyle  . Physical activity:    Days per week: Not on file    Minutes per session: Not on file  . Stress: Not on file  Relationships  . Social connections:    Talks on phone: Not on file    Gets together: Not on file    Attends religious service: Not on file    Active member of club or organization: Not on file    Attends meetings of clubs or organizations: Not on file    Relationship status: Not on file  . Intimate partner violence:    Fear of current or ex partner: Not on file    Emotionally abused: Not on file    Physically abused: Not on file    Forced sexual activity: Not on file  Other Topics Concern  . Not on file  Social History Narrative   Patient is right-handed,  though states she can use either. She lives wirth her husband in a 1 story house, 5 steps to enter. She drinks I cup of coffee and 1 soda a day. She walks several miles a day a work.      Patient is currently separated from her husband. She lives in a 2 level home now. She drinks one cup of coffee and one soda a day.    REVIEW OF SYSTEMS: Constitutional: No fevers, chills, or sweats, no generalized fatigue, change in appetite Eyes: No visual changes, double vision, eye pain Ear, nose and throat: No hearing loss, ear pain, nasal congestion, sore throat Cardiovascular: No chest pain, palpitations Respiratory:  No shortness of breath at rest or with exertion, wheezes GastrointestinaI: No nausea, vomiting, diarrhea, abdominal pain, fecal  incontinence Genitourinary:  No dysuria, urinary retention or frequency Musculoskeletal:  No neck pain, back pain Integumentary: No rash, pruritus, skin lesions Neurological: as above Psychiatric: No depression, insomnia, anxiety Endocrine: No palpitations, fatigue, diaphoresis, mood swings, change in appetite, change in weight, increased thirst Hematologic/Lymphatic:  No purpura, petechiae. Allergic/Immunologic: no itchy/runny eyes, nasal congestion, recent allergic reactions, rashes  PHYSICAL EXAM: *** General: No acute distress.  Patient appears ***-groomed.  *** body habitus. Head:  Normocephalic/atraumatic Eyes:  Fundi examined but not visualized Neck: supple, no paraspinal tenderness, full range of motion Heart:  Regular rate and rhythm Lungs:  Clear to auscultation bilaterally Back: No paraspinal tenderness Neurological Exam: alert and oriented to person, place, and time. Attention span and concentration intact, recent and remote memory intact, fund of knowledge intact.  Speech fluent and not dysarthric, language intact.  CN II-XII intact. Bulk and tone normal, muscle strength 5/5 throughout.  Sensation to light touch, temperature and vibration intact.  Deep tendon reflexes 2+ throughout, toes downgoing.  Finger to nose and heel to shin testing intact.  Gait normal, Romberg negative.  IMPRESSION: 1.  Low Back Pain with paresthesias in legs:  Work up negative for neuropathy or radiculopathy/spinal stenosis 2.  Migraine without aura, without status migrainosus, not intractable  PLAN: 1.  For preventative management, *** 2.  For abortive therapy, *** 3.  Limit use of pain relievers to no more than 2 days out of week to prevent risk of rebound or medication-overuse headache. 4.  Keep headache diary 5.  Exercise, hydration, caffeine cessation, sleep hygiene, monitor for and avoid triggers 6.  Consider:  magnesium citrate  daily, riboflavin  daily, and coenzyme Q10   three times daily 7.  Follow up ***  Shon Millet, DO  CC:  Roxy Manns, MD

## 2018-03-27 ENCOUNTER — Ambulatory Visit: Payer: Managed Care, Other (non HMO) | Admitting: Neurology

## 2018-04-09 ENCOUNTER — Encounter (HOSPITAL_COMMUNITY): Payer: Self-pay | Admitting: *Deleted

## 2018-04-09 ENCOUNTER — Other Ambulatory Visit: Payer: Self-pay

## 2018-04-09 ENCOUNTER — Emergency Department (HOSPITAL_COMMUNITY)
Admission: EM | Admit: 2018-04-09 | Discharge: 2018-04-09 | Disposition: A | Discharge status: Home / Self Care | Payer: Managed Care, Other (non HMO) | Attending: Emergency Medicine | Admitting: Emergency Medicine

## 2018-04-09 DIAGNOSIS — H9202 Otalgia, left ear: Secondary | ICD-10-CM

## 2018-04-09 DIAGNOSIS — Z79899 Other long term (current) drug therapy: Secondary | ICD-10-CM | POA: Diagnosis not present

## 2018-04-09 DIAGNOSIS — H6992 Unspecified Eustachian tube disorder, left ear: Secondary | ICD-10-CM | POA: Insufficient documentation

## 2018-04-09 DIAGNOSIS — H6982 Other specified disorders of Eustachian tube, left ear: Secondary | ICD-10-CM

## 2018-04-09 MED ORDER — FLUTICASONE PROPIONATE 50 MCG/ACT NA SUSP: 2.0000 | Freq: Two times a day (BID) | NASAL | 0 refills | Status: DC

## 2018-04-09 NOTE — ED Provider Notes (Signed)
[facility] EMERGENCY DEPARTMENT Provider Note   CSN: [id number] Arrival date & time: 04/09/18  2050    History   Chief Complaint Chief Complaint  Patient presents with  . Otalgia    HPI Shannon Dyer is a 38 y.o. female presenting today for left ear pain that began this morning.  Patient states that shortly after she woke up she began having a left ear pain that she describes as a moderate in intensity sharp pressure constant without aggravating or alleviating factors.  Patient reports that she has a history of allergies for which she takes Benadryl however this has not helped with her ear pain today.  Patient states that she has felt the need to pop her ears more often over the past few days and feels that she cannot "pop" her left ear today.  Patient denies fever, chills, swelling of the face/head or neck, immunocompromising diseases, cough, body aches, sore throat or any additional concerns today.     HPI  Past Medical History:  Diagnosis Date  . Migraines   . Panic attacks   . Tobacco abuse     Patient Active Problem List   Diagnosis Date Noted  . Migraines   . Neck strain, initial encounter 11/27/2017  . Headache 09/24/2017  . Dizziness 09/24/2017  . Skin lesion 09/24/2017  . Smoker 08/11/2016  . 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 Dyspepsia 02/14/2016  . Myalgia 11/21/2012  . Encounter for routine gynecological examination 05/22/2012  . 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 BACK PAIN 02/16/2010  . ANXIETY DEPRESSION 06/30/2008  . GANGLION CYST, WRIST, LEFT 07/25/2007    Past Surgical History:  Procedure Laterality Date  . cystic ovarian mass  01/2005  . ELBOW LIGAMENT RECONSTRUCTION       OB History    Gravida  0   Para      Term      Preterm      AB      Living        SAB      TAB      Ectopic      Multiple      Live Births               Home Medications    Prior to Admission medications     Medication Sig Start Date End Date Taking? Authorizing Provider  fluticasone (FLONASE) 50 MCG/ACT nasal spray Place 2 sprays into both nostrils 2 (two) times daily. 04/09/18   Harlene Salts A, PA-C  naproxen (NAPROSYN) 500 MG tablet Take 1 tablet (500 mg total) by mouth 2 (two) times daily as needed. 11/21/17   Drema Dallas, DO  predniSONE (DELTASONE) 10 MG tablet Take 4 tablets (40 mg total) by mouth daily with breakfast. 02/24/18   Janne Napoleon, NP  pregabalin (LYRICA) 50 MG capsule Take 1 capsule (50 mg total) by mouth 3 (three) times daily. 12/31/17   Drema Dallas, DO  SUMAtriptan (IMITREX) 100 MG tablet Take 1 tablet earliest onset of migraine.  May repeat x1 in 2 hours if headache persists or recurs.  Do not exceed 2 tablets in 24h 11/21/17   Jaffe, Adam R, DO  topiramate (TOPAMAX) 50 MG tablet TAKE 1 TABLET BY MOUTH EVERYDAY AT BEDTIME 03/19/18   Everlena Cooper, Adam R, DO  traZODone (DESYREL) 100 MG tablet Take 100-200 mg by mouth at bedtime as needed for sleep.  02/03/17   [provider]    Family  History Family History  Problem Relation Age of Onset  . Hypertension Father 58       died of unknown causes-autopsy  . COPD Father   . Asthma Sister     Social History Social History   Tobacco Use  . Smoking status: Current Every Day Smoker    Packs/day: 0.50    Years: 7.00    Pack years: 3.50    Types: Cigarettes  . Smokeless tobacco: Never Used  Substance Use Topics  . Alcohol use: Yes    Alcohol/week: 0.0 standard drinks    Comment: occasionally  . Drug use: No     Allergies   Patient has no known allergies.   Review of Systems Review of Systems  Constitutional: Negative.  Negative for chills and fever.  HENT: Positive for ear pain and rhinorrhea. Negative for dental problem, ear discharge, facial swelling, hearing loss, sore throat, tinnitus, trouble swallowing and voice change.   Eyes: Negative.  Negative for pain, redness and visual disturbance.  Respiratory:  Negative.  Negative for cough.   Gastrointestinal: Negative.  Negative for abdominal pain, nausea and vomiting.  Neurological: Negative.  Negative for dizziness, weakness, numbness and headaches.   Physical Exam Updated Vital Signs BP 120/77 (BP Location: Right Arm)   Pulse 87   Temp 98.2 F (36.8 C) (Oral)   Resp 16   LMP 03/26/2018   SpO2 97%   Physical Exam Constitutional:      General: She is not in acute distress.    Appearance: Normal appearance. She is well-developed. She is not ill-appearing or diaphoretic.  HENT:     Head: Normocephalic and atraumatic. No raccoon eyes, Battle's sign, right periorbital erythema or left periorbital erythema.     Jaw: There is normal jaw occlusion. No trismus or tenderness.     Comments: No pain about the temporal arteries.    Right Ear: Tympanic membrane, ear canal and external ear normal. There is no impacted cerumen. No foreign body. No mastoid tenderness. No hemotympanum. Tympanic membrane is not perforated, erythematous or bulging.     Left Ear: Tympanic membrane, ear canal and external ear normal. There is no impacted cerumen. No foreign body. No mastoid tenderness. No hemotympanum. Tympanic membrane is not perforated, erythematous or bulging.     Ears:     Comments: Patient with small amount of clear fluid behind left tympanic membrane    Nose: Nose normal.     Right Sinus: No maxillary sinus tenderness or frontal sinus tenderness.     Left Sinus: No maxillary sinus tenderness or frontal sinus tenderness.     Mouth/Throat:     Comments: Patient with poor dentition overall, multiple dental caries however no pain to percussion of any of the teeth.  The patient has normal phonation and is in control of secretions. No stridor.  Midline uvula without edema. Soft palate rises symmetrically. No tonsillar erythema, swelling or exudates. Tongue protrusion is normal, floor of mouth is soft. No trismus. No creptius on neck palpation. No gingival  erythema or fluctuance noted. Mucus membranes moist.  Eyes:     General: Vision grossly intact. Gaze aligned appropriately.     Extraocular Movements: Extraocular movements intact.     Conjunctiva/sclera: Conjunctivae normal.     Pupils: Pupils are equal, round, and reactive to light.     Comments: Visual fields grossly intact bilaterally.  Neck:     Musculoskeletal: Full passive range of motion without pain, normal range of motion and neck supple.  Trachea: Trachea and phonation normal. No tracheal tenderness or tracheal deviation.  Cardiovascular:     Rate and Rhythm: Normal rate and regular rhythm.     Heart sounds: Normal heart sounds.  Pulmonary:     Effort: Pulmonary effort is normal. No respiratory distress.     Breath sounds: Normal breath sounds and air entry. No wheezing or rhonchi.  Musculoskeletal: Normal range of motion.     Comments: Moving all extremities spontaneously.  Normal gait.  Skin:    General: Skin is warm and dry.  Neurological:     Mental Status: She is alert.     GCS: GCS eye subscore is 4. GCS verbal subscore is 5. GCS motor subscore is 6.     Comments: Speech is clear and goal oriented, follows commands Major Cranial nerves without deficit, no facial droop Moves extremities without ataxia, coordination intact Normal gait  Psychiatric:        Behavior: Behavior normal.    ED Treatments / Results  Labs (all labs ordered are listed, but only abnormal results are displayed) Labs Reviewed - No data to display  EKG None  Radiology No results found.  Procedures Procedures (including critical care time)  Medications Ordered in ED Medications - No data to display   Initial Impression / Assessment and Plan / ED Course  I have reviewed the triage vital signs and the nursing notes.  Pertinent labs & imaging results that were available during my care of the patient were reviewed by me and considered in my medical decision making (see chart for  details).    38 year old female without history immunocompromising diseases presents today for left otalgia that began this morning.  Physical examination remarkable for clear fluid behind left TM.  No signs of otitis media, otitis externa, mastoiditis or injury of the head or neck.  There are no vesicles present in the ear canal suggestive of herpes infection.  No pain at the TMJ with movement.  Patient does have poor dentition overall however there is no pain to percussion of the teeth and no signs of dental abscess.  Airway is clear without abnormality, no signs of Ludwig's angina, retropharyngeal abscess or other deep tissue infections of the head or neck.  Patient does have mild clear rhinorrhea without signs of sinusitis.  Patient states she has a history of allergies which she uses Benadryl for.  She reports popping her ears often however has been unable to do so over the past few days.  At this time I suspect eustachian tube dysfunction as cause of patient's symptoms today.  Will treat with Flonase and OTC antihistamines.  Discussed with patient who is agreeable to plan discharge at this time.  Patient states that she has a primary care doctor and plans to call them tomorrow to schedule a follow-up.  At this time there does not appear to be any evidence of an acute emergency medical condition and the patient appears stable for discharge with appropriate outpatient follow up. Diagnosis was discussed with patient who verbalizes understanding of care plan and is agreeable to discharge. I have discussed return precautions with patient and husband who verbalize understanding of return precautions. Patient encouraged to follow-up with their PCP. All questions answered.  Patient has been discharged in good condition.   Note: Portions of this report may have been transcribed using voice recognition software. Every effort was made to ensure accuracy; however, inadvertent computerized transcription errors may  still be present.  Final Clinical Impressions(s) /  ED Diagnoses   Final diagnoses:  Otalgia of left ear  Eustachian tube dysfunction, left    ED Discharge Orders         Ordered    fluticasone (FLONASE) 50 MCG/ACT nasal spray  2 times daily     04/09/18 2154           Elizabeth Palau 04/09/18 2159    Eber Hong, MD 04/10/18 [phone]

## 2018-04-09 NOTE — ED Notes (Signed)
ED Provider at bedside. 

## 2018-04-09 NOTE — Discharge Instructions (Signed)
You have been diagnosed today with left ear pain likely due to eustachian tube dysfunction.  At this time there does not appear to be the presence of an emergent medical condition, however there is always the potential for conditions to change. Please read and follow the below instructions.  Please return to the Emergency Department immediately for any new or worsening symptoms. Please be sure to follow up with your Primary Care Provider within one week regarding your visit today; please call their office to schedule an appointment even if you are feeling better for a follow-up visit. You may use the medication Flonase as directed to help with your symptoms. You may also use over-the-counter Zyrtec as directed on the packaging to help with your symptoms.  If you choose to begin using Zyrtec then please do not use Benadryl as these medications act in a similar fashion and will make you very sleepy.  Either choose Benadryl or Zyrtec to take, not both.  But these medications may make you drowsy so do not drive while taking his medications.  Get help right away if: You have a severe headache. You have a stiff neck. You have trouble swallowing. You have redness or swelling behind your ear. You have fluid or blood coming from your ear. You have hearing loss. You feel dizzy. Any new or concerning symptoms.  Please read the additional information packets attached to your discharge summary.  Do not take your medicine if  develop an itchy rash, swelling in your mouth or lips, or difficulty breathing.

## 2018-04-09 NOTE — ED Triage Notes (Signed)
Left ear pain since waking this morning.

## 2018-04-12 ENCOUNTER — Encounter: Payer: Self-pay | Admitting: Family Medicine

## 2018-04-12 ENCOUNTER — Ambulatory Visit (INDEPENDENT_AMBULATORY_CARE_PROVIDER_SITE_OTHER): Payer: Managed Care, Other (non HMO) | Admitting: Family Medicine

## 2018-04-12 ENCOUNTER — Other Ambulatory Visit: Payer: Self-pay

## 2018-04-12 VITALS — BP 118/70 | HR 92 | Temp 98.4°F | Ht 63.0 in | Wt 148.1 lb

## 2018-04-12 DIAGNOSIS — J019 Acute sinusitis, unspecified: Secondary | ICD-10-CM | POA: Insufficient documentation

## 2018-04-12 DIAGNOSIS — J0100 Acute maxillary sinusitis, unspecified: Secondary | ICD-10-CM

## 2018-04-12 MED ORDER — AMOXICILLIN-POT CLAVULANATE 875-125 MG PO TABS: 1.0000 | ORAL_TABLET | Freq: Two times a day (BID) | ORAL | 0 refills | Status: DC

## 2018-04-12 NOTE — Progress Notes (Signed)
Subjective:    Patient ID: Shannon Dyer, female    DOB: [birth year], 38 y.o.   MRN: [id number]  HPI Here for f/u of L ear pain - seen in ED on 3/10   Exam -clear fluid behind L TM without signs of infection  Poor dentition noted but no signs of abscess  She reported popping ears frequently Has nasal allergies tx with flonase and otc antihistamines   Current smoker 3.5 pack years   Symptoms are worse More pain (sharp) in that ear (R ear is ok)  No d/c from ear  Had a temp this am 99.6 low grade  Both runny and stuffy nose  A little cough -not a lot   No wheezing   otc- ibuprofen    Patient Active Problem List   Diagnosis Date Noted  . Acute sinusitis 04/12/2018  . Migraines   . Neck strain, initial encounter 11/27/2017  . Headache 09/24/2017  . Dizziness 09/24/2017  . Skin lesion 09/24/2017  . Smoker 08/11/2016  . 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 Dyspepsia 02/14/2016  . Myalgia 11/21/2012  . Encounter for routine gynecological examination 05/22/2012  . 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 BACK PAIN 02/16/2010  . ANXIETY DEPRESSION 06/30/2008  . GANGLION CYST, WRIST, LEFT 07/25/2007   Past Medical History:  Diagnosis Date  . Migraines   . Panic attacks   . Tobacco abuse    Past Surgical History:  Procedure Laterality Date  . cystic ovarian mass  01/2005  . ELBOW LIGAMENT RECONSTRUCTION     Social History   Tobacco Use  . Smoking status: Current Every Day Smoker    Packs/day: 0.50    Years: 7.00    Pack years: 3.50    Types: Cigarettes  . Smokeless tobacco: Never Used  Substance Use Topics  . Alcohol use: Yes    Alcohol/week: 0.0 standard drinks    Comment: occasionally  . Drug use: No   Family History  Problem Relation Age of Onset  . Hypertension Father 81       died of unknown causes-autopsy  . COPD Father   . Asthma Sister    No Known Allergies Current Outpatient Medications on File Prior to Visit  Medication Sig Dispense  Refill  . fluticasone (FLONASE) 50 MCG/ACT nasal spray Place 2 sprays into both nostrils 2 (two) times daily. 9.9 g 0  . SUMAtriptan (IMITREX) 100 MG tablet Take 1 tablet earliest onset of migraine.  May repeat x1 in 2 hours if headache persists or recurs.  Do not exceed 2 tablets in 24h 10 tablet 3  . topiramate (TOPAMAX) 50 MG tablet TAKE 1 TABLET BY MOUTH EVERYDAY AT BEDTIME 30 tablet 5   No current facility-administered medications on file prior to visit.     Review of Systems  Constitutional: Positive for appetite change. Negative for fatigue and fever.  HENT: Positive for congestion, ear pain, postnasal drip, rhinorrhea, sinus pressure and sore throat. Negative for ear discharge, hearing loss and nosebleeds.   Eyes: Negative for pain, redness and itching.  Respiratory: Positive for cough. Negative for shortness of breath and wheezing.   Cardiovascular: Negative for chest pain.  Gastrointestinal: Negative for abdominal pain, diarrhea, nausea and vomiting.  Endocrine: Negative for polyuria.  Genitourinary: Negative for dysuria, frequency and urgency.  Musculoskeletal: Negative for arthralgias and myalgias.  Allergic/Immunologic: Negative for immunocompromised state.  Neurological: Positive for headaches. Negative for dizziness, tremors, syncope, weakness and numbness.  Hematological: Negative for adenopathy. Does not  bruise/bleed easily.  Psychiatric/Behavioral: Negative for dysphoric mood. The patient is not nervous/anxious.        Objective:   Physical Exam Constitutional:      General: She is not in acute distress.    Appearance: Normal appearance. She is well-developed and normal weight. She is not ill-appearing.  HENT:     Head: Normocephalic and atraumatic.     Comments: Left maxillary and frontal sinus tenderness    Right Ear: Tympanic membrane, ear canal and external ear normal.     Left Ear: Ear canal and external ear normal.     Ears:     Comments: L TM is dull with  mild pink color     Nose: Congestion and rhinorrhea present.     Mouth/Throat:     Pharynx: Oropharynx is clear. No oropharyngeal exudate or posterior oropharyngeal erythema.     Comments: Clear pnd Eyes:     General:        Right eye: No discharge.        Left eye: No discharge.     Conjunctiva/sclera: Conjunctivae normal.     Pupils: Pupils are equal, round, and reactive to light.  Neck:     Musculoskeletal: Normal range of motion and neck supple.  Cardiovascular:     Rate and Rhythm: Normal rate and regular rhythm.  Pulmonary:     Effort: Pulmonary effort is normal. No respiratory distress.     Breath sounds: Normal breath sounds. No wheezing or rales.     Comments: Good air exch No rales or rhonchi  bs are mildly distant No wheeze even on forced exp Lymphadenopathy:     Cervical: No cervical adenopathy.  Skin:    General: Skin is warm and dry.     Findings: No rash.  Neurological:     Mental Status: She is alert.     Cranial Nerves: No cranial nerve deficit.     Coordination: Coordination normal.  Psychiatric:        Mood and Affect: Mood normal.           Assessment & Plan:   Problem List Items Addressed This Visit      Respiratory   Acute sinusitis - Primary    L maxillary and frontal with L ear pain ETD (poss early OM) tx with augmentin Symptom control with ibuprofen Orinda Kenner flonase  Rest/fluids Warm compresses Update if not starting to improve in a week or if worsening        Relevant Medications   amoxicillin-clavulanate (AUGMENTIN) 875-125 MG tablet

## 2018-04-12 NOTE — Assessment & Plan Note (Signed)
L maxillary and frontal with L ear pain ETD (poss early OM) tx with augmentin Symptom control with ibuprofen Orinda Kenner flonase  Rest/fluids Warm compresses Update if not starting to improve in a week or if worsening

## 2018-04-12 NOTE — Patient Instructions (Signed)
Work on cutting smoking further  Drink fluids Breathe steam  Warm compresses to face and ear   Continue flonase  Take augmentin as directed   For pain and inflammation ibuprofen 400 mg every 6-8 hours with food  This will help a lot    Update if not starting to improve in a week or if worsening

## 2018-04-19 ENCOUNTER — Ambulatory Visit: Payer: Managed Care, Other (non HMO) | Admitting: Family Medicine

## 2018-04-19 ENCOUNTER — Encounter: Payer: Self-pay | Admitting: Family Medicine

## 2018-04-19 ENCOUNTER — Telehealth: Payer: Self-pay

## 2018-04-19 MED ORDER — AZITHROMYCIN 250 MG PO TABS: ORAL_TABLET | ORAL | 0 refills | Status: DC

## 2018-04-19 NOTE — Telephone Encounter (Signed)
Left detailed message on voicemail for call back with pharmacy preference.

## 2018-04-19 NOTE — Addendum Note (Signed)
Addended by: Roxy Manns A on: 04/19/2018 03:57 PM   Modules accepted: Orders

## 2018-04-19 NOTE — Telephone Encounter (Addendum)
Pt did not come to appt - plz call for an update on symptoms.  Will cc PCP

## 2018-04-19 NOTE — Progress Notes (Deleted)
   LMP 03/26/2018    CC: not better Subjective:    Patient ID: Shannon Dyer, female    DOB: 01/23/1981, 39 y.o.   MRN: [id number]  HPI: Shannon Dyer is a 38 y.o. female presenting on 04/19/2018 for No chief complaint on file.    See prior note for details. Saw PCP last Friday, note reviewed. At that time, had L ear pain, Tmax 99.6, nasal congestion and rhinorrhea, mild cough. Treated for acute sinusitis with augmentin 7d course, rec ibuprofen, flonase.   Cough has progressed - productive of green phlegm and now with coughing fits leading to dyspnea. Feverish, now bilateral ear pain.   L nipple sore -  No recent mammogram.   Current smoker.  No h/o asthma.  No recent travel or COVID19 exposure.       Relevant past medical, surgical, family and social history reviewed and updated as indicated. Interim medical history since our last visit reviewed. Allergies and medications reviewed and updated. Outpatient Medications Prior to Visit  Medication Sig Dispense Refill  . amoxicillin-clavulanate (AUGMENTIN) 875-125 MG tablet Take 1 tablet by mouth 2 (two) times daily. With food 14 tablet 0  . fluticasone (FLONASE) 50 MCG/ACT nasal spray Place 2 sprays into both nostrils 2 (two) times daily. 9.9 g 0  . SUMAtriptan (IMITREX) 100 MG tablet Take 1 tablet earliest onset of migraine.  May repeat x1 in 2 hours if headache persists or recurs.  Do not exceed 2 tablets in 24h 10 tablet 3  . topiramate (TOPAMAX) 50 MG tablet TAKE 1 TABLET BY MOUTH EVERYDAY AT BEDTIME 30 tablet 5   No facility-administered medications prior to visit.      Per HPI unless specifically indicated in ROS section below Review of Systems Objective:    LMP 03/26/2018   Wt Readings from Last 3 Encounters:  04/12/18 148 lb 1 oz (67.2 kg)  12/31/17 152 lb (68.9 kg)  12/11/17 152 lb (68.9 kg)    Physical Exam    Results for orders placed or performed in visit on 12/31/17  Vitamin B12  Result Value Ref Range    Vitamin B-12 295 211 - 911 pg/mL   Assessment & Plan:   Problem List Items Addressed This Visit    None       No orders of the defined types were placed in this encounter.  No orders of the defined types were placed in this encounter.   Follow up plan: No follow-ups on file.  Eustaquio Boyden, MD

## 2018-04-19 NOTE — Telephone Encounter (Signed)
Patient advised.

## 2018-04-19 NOTE — Telephone Encounter (Signed)
I pended a px for zpak to send to her pref pharmacy (she has several listed)  Looks like she did not come in today  I am out of the office If sob any worse - needs to go to ED

## 2018-04-19 NOTE — Addendum Note (Signed)
Addended by: Annamarie Major on: 04/19/2018 05:10 PM   Modules accepted: Orders

## 2018-04-19 NOTE — Telephone Encounter (Signed)
Pt last seen 04/12/18 and has 2 more Augmentin to take but prod cough has green phlegm and pt has coughing episodes that takes her breath (SOB) which started today.? Fever pt is at work and cannot take temp; pt said she might feel a little warm but not sure.Lt earache continues and rt earache starting today. Nipple on lt breast is hurting and pt feels something hard; no drainage.occurred 2 wks ago and went away but has come back today. Pt has not traveled and no exposure to positive covid or flu individuals. Pt scheduled appt with Dr Reece Agar 04/19/18 at 2 PM. ED precautions given.

## 2018-04-19 NOTE — Telephone Encounter (Signed)
Will see today.  

## 2018-04-19 NOTE — Telephone Encounter (Signed)
Left message on vm for pt to call back.  Need to get update on pt.  If she is worse and want abx, please send pharmacy of pt's choice.

## 2018-04-22 ENCOUNTER — Other Ambulatory Visit: Payer: Self-pay

## 2018-04-22 ENCOUNTER — Encounter: Payer: Self-pay | Admitting: Radiology

## 2018-04-22 ENCOUNTER — Other Ambulatory Visit: Payer: Self-pay | Admitting: Obstetrics & Gynecology

## 2018-04-22 ENCOUNTER — Other Ambulatory Visit: Payer: Self-pay | Admitting: *Deleted

## 2018-04-22 ENCOUNTER — Ambulatory Visit: Payer: Self-pay | Admitting: Obstetrics & Gynecology

## 2018-04-22 ENCOUNTER — Ambulatory Visit
Admission: RE | Admit: 2018-04-22 | Discharge: 2018-04-22 | Disposition: A | Discharge status: Home / Self Care | Payer: Managed Care, Other (non HMO) | Source: Ambulatory Visit | Attending: Obstetrics & Gynecology | Admitting: Obstetrics & Gynecology

## 2018-04-22 DIAGNOSIS — N6320 Unspecified lump in the left breast, unspecified quadrant: Secondary | ICD-10-CM

## 2018-04-24 ENCOUNTER — Other Ambulatory Visit: Payer: Self-pay

## 2018-04-24 ENCOUNTER — Telehealth (INDEPENDENT_AMBULATORY_CARE_PROVIDER_SITE_OTHER): Payer: Managed Care, Other (non HMO) | Admitting: Family Medicine

## 2018-04-24 DIAGNOSIS — J069 Acute upper respiratory infection, unspecified: Secondary | ICD-10-CM | POA: Diagnosis not present

## 2018-04-24 DIAGNOSIS — B9789 Other viral agents as the cause of diseases classified elsewhere: Secondary | ICD-10-CM | POA: Diagnosis not present

## 2018-04-24 DIAGNOSIS — F17200 Nicotine dependence, unspecified, uncomplicated: Secondary | ICD-10-CM

## 2018-04-24 MED ORDER — BENZONATATE 200 MG PO CAPS: 200.0000 mg | ORAL_CAPSULE | Freq: Three times a day (TID) | ORAL | 0 refills | Status: DC | PRN

## 2018-04-24 MED ORDER — GUAIFENESIN-CODEINE 100-10 MG/5ML PO SYRP: 5.0000 mL | ORAL_SOLUTION | Freq: Four times a day (QID) | ORAL | 0 refills | Status: DC | PRN

## 2018-04-24 NOTE — Patient Instructions (Signed)
Follow Up Instructions: Take tessalon and robitussin ac (caution of sedation) for cough Tylenol for chills if helpful Try hard to quit smoking  Isolate yourself at home until totally better (7 days or longer)  A work note is available for you up front  If worse (short of breath)- get to the ER

## 2018-04-24 NOTE — Assessment & Plan Note (Signed)
Disc in detail risks of smoking and possible outcomes including copd, vascular/ heart disease, cancer , respiratory and sinus infections  Pt voices understanding Pt unsure if she can quit smoking while she is sick but will try

## 2018-04-24 NOTE — Progress Notes (Signed)
Virtual Visit via Telephone Note  I connected with Shannon Dyer on 04/24/18 at 11:00 AM EDT by telephone and verified that I am speaking with the correct person using two identifiers.   I discussed the limitations, risks, security and privacy concerns of performing an evaluation and management service by telephone and the availability of in person appointments. I also discussed with the patient that there may be a patient responsible charge related to this service. The patient expressed understanding and agreed to proceed.   History of Present Illness: Having cold chills  No body aches  Her cough is getting worse -taking her breath away  Green phlegm with it  Sneezing more than normal as well / has allergies  Head feels warm to the touch (thermometer does not work)  She took tylenol this am /did not help   Some ear pain -not as bad as it was  Throat- not sore   Scant wheeze early in am only -not bad   She had a sinus infection 04/12/18 L maxillary and frontal with L ear pain -tx with augmentin  She got worse with prod cough/green phlegm  Then zpak was px  Has flonase also   Smoking status  1/2ppd -has only smoked 1 today   She left work and is now home   ROS: negative for  Body aches Inability to swallow Neck pain or stiffness  N/v/d  Rash Shortness of breath Headache  Depression or anxiety   Patient Active Problem List   Diagnosis Date Noted  . Viral URI with cough 04/24/2018    Priority: High  . Acute sinusitis 04/12/2018  . Migraines   . Neck strain, initial encounter 11/27/2017  . Headache 09/24/2017  . Dizziness 09/24/2017  . Skin lesion 09/24/2017  . Smoker 08/11/2016  . 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 Dyspepsia 02/14/2016  . Myalgia 11/21/2012  . Encounter for routine gynecological examination 05/22/2012  . 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 BACK PAIN 02/16/2010  . ANXIETY DEPRESSION 06/30/2008  . GANGLION CYST, WRIST, LEFT 07/25/2007    Past Medical History:  Diagnosis Date  . Migraines   . Panic attacks   . Tobacco abuse    Past Surgical History:  Procedure Laterality Date  . cystic ovarian mass  01/2005  . ELBOW LIGAMENT RECONSTRUCTION     Social History   Tobacco Use  . Smoking status: Current Every Day Smoker    Packs/day: 0.50    Years: 7.00    Pack years: 3.50    Types: Cigarettes  . Smokeless tobacco: Never Used  Substance Use Topics  . Alcohol use: Yes    Alcohol/week: 0.0 standard drinks    Comment: occasionally  . Drug use: No   Family History  Problem Relation Age of Onset  . Hypertension Father 56       died of unknown causes-autopsy  . COPD Father   . Asthma Sister   . Breast cancer Neg Hx    No Known Allergies Current Outpatient Medications on File Prior to Visit  Medication Sig Dispense Refill  . amoxicillin-clavulanate (AUGMENTIN) 875-125 MG tablet Take 1 tablet by mouth 2 (two) times daily. With food 14 tablet 0  . azithromycin (ZITHROMAX Z-PAK) 250 MG tablet Take 2 pills by mouth today and then 1 pill daily for 4 days 6 tablet 0  . fluticasone (FLONASE) 50 MCG/ACT nasal spray Place 2 sprays into both nostrils 2 (two) times daily. 9.9 g 0  . SUMAtriptan (IMITREX) 100  MG tablet Take 1 tablet earliest onset of migraine.  May repeat x1 in 2 hours if headache persists or recurs.  Do not exceed 2 tablets in 24h 10 tablet 3  . topiramate (TOPAMAX) 50 MG tablet TAKE 1 TABLET BY MOUTH EVERYDAY AT BEDTIME 30 tablet 5   No current facility-administered medications on file prior to visit.       Observations/Objective: Pt sounds fatigued on the phone with occ dry sounding cough and slt hoarseness   Assessment and Plan: Problem List Items Addressed This Visit      Respiratory   Viral URI with cough - Primary    In pt who smokes with recent tx of sinusitis/ear infx (those sympt are improved with augmentin and then zpak)  Now she has more cold chills (needs a thermometer-? If fever) and  cough with green sputum production  Adv to finish zpack Also to isolate self (1-2 weeks depending on when symptoms go away)-will write work note for pick up  Sent in tessalon and robitussin AC (caution of sedation) to her pharmacy  Enc very strongly to quit smoking  Disc poss of covid (cannot rule it out)-stressed inst to go to ED if she becomes sob or if symptoms worsen  A family member will go to pharmacy and pick up meds as well as a thermometer Update if not starting to improve in a week or if worsening          Other   Smoker    Disc in detail risks of smoking and possible outcomes including copd, vascular/ heart disease, cancer , respiratory and sinus infections  Pt voices understanding Pt unsure if she can quit smoking while she is sick but will try            Follow Up Instructions: Take tessalon and robitussin ac (caution of sedation) for cough Tylenol for chills if helpful Get a thermometer to check temperature if you can  Try hard to quit smoking  Isolate yourself at home until totally better (7 days or longer)  A work note is available for you up front  If worse (short of breath)- get to the ER     I discussed the assessment and treatment plan with the patient. The patient was provided an opportunity to ask questions and all were answered. The patient agreed with the plan and demonstrated an understanding of the instructions.   The patient was advised to call back or seek an in-person evaluation if the symptoms worsen or if the condition fails to improve as anticipated.  I provided 12 minutes of non-face-to-face time during this encounter.   Roxy Manns, MD

## 2018-04-24 NOTE — Assessment & Plan Note (Signed)
In pt who smokes with recent tx of sinusitis/ear infx (those sympt are improved with augmentin and then zpak)  Now she has more cold chills (needs a thermometer-? If fever) and cough with green sputum production  Adv to finish zpack Also to isolate self (1-2 weeks depending on when symptoms go away)-will write work note for pick up  Sent in tessalon and robitussin AC (caution of sedation) to her pharmacy  Enc very strongly to quit smoking  Disc poss of covid (cannot rule it out)-stressed inst to go to ED if she becomes sob or if symptoms worsen  A family member will go to pharmacy and pick up meds as well as a thermometer Update if not starting to improve in a week or if worsening

## 2018-04-29 ENCOUNTER — Other Ambulatory Visit: Payer: Self-pay

## 2018-05-03 ENCOUNTER — Encounter (HOSPITAL_BASED_OUTPATIENT_CLINIC_OR_DEPARTMENT_OTHER): Payer: Self-pay | Admitting: Emergency Medicine

## 2018-05-03 ENCOUNTER — Emergency Department (HOSPITAL_BASED_OUTPATIENT_CLINIC_OR_DEPARTMENT_OTHER)
Admission: EM | Admit: 2018-05-03 | Discharge: 2018-05-03 | Disposition: A | Discharge status: Home / Self Care | Payer: Managed Care, Other (non HMO) | Attending: Emergency Medicine | Admitting: Emergency Medicine

## 2018-05-03 ENCOUNTER — Other Ambulatory Visit: Payer: Self-pay

## 2018-05-03 ENCOUNTER — Ambulatory Visit: Payer: Self-pay | Admitting: *Deleted

## 2018-05-03 ENCOUNTER — Emergency Department (HOSPITAL_BASED_OUTPATIENT_CLINIC_OR_DEPARTMENT_OTHER): Payer: Managed Care, Other (non HMO)

## 2018-05-03 DIAGNOSIS — J069 Acute upper respiratory infection, unspecified: Secondary | ICD-10-CM | POA: Diagnosis not present

## 2018-05-03 DIAGNOSIS — F17210 Nicotine dependence, cigarettes, uncomplicated: Secondary | ICD-10-CM | POA: Insufficient documentation

## 2018-05-03 DIAGNOSIS — R05 Cough: Secondary | ICD-10-CM | POA: Diagnosis present

## 2018-05-03 DIAGNOSIS — B9789 Other viral agents as the cause of diseases classified elsewhere: Secondary | ICD-10-CM

## 2018-05-03 MED ORDER — BENZONATATE 100 MG PO CAPS: 100.0000 mg | ORAL_CAPSULE | Freq: Three times a day (TID) | ORAL | 0 refills | Status: AC

## 2018-05-03 MED ORDER — ALBUTEROL SULFATE HFA 108 (90 BASE) MCG/ACT IN AERS
8.0000 | INHALATION_SPRAY | Freq: Once | RESPIRATORY_TRACT | Status: AC
  Administered 2018-05-03: 8 via RESPIRATORY_TRACT
  Filled 2018-05-03: qty 6.7

## 2018-05-03 MED ORDER — AEROCHAMBER PLUS FLO-VU MEDIUM MISC
1.0000 | Freq: Once | Status: AC
  Administered 2018-05-03: 1
  Filled 2018-05-03: qty 1

## 2018-05-03 NOTE — Discharge Instructions (Signed)
Please use two puffs of the albuterol inhaler every 4-6 hours as needed for shortness of breath and cough.   You should be isolated for at least 7 days since the onset of your symptoms AND >72 hours after symptoms resolution (absence of fever without the use of fever reducing medication and improvement in respiratory symptoms), whichever is longer   Please follow up with your primary care provider within 5-7 days for re-evaluation of your symptoms. Please return to the emergency department for any new or worsening symptoms.

## 2018-05-03 NOTE — Telephone Encounter (Signed)
Noted. Will cc pcp as fyi.

## 2018-05-03 NOTE — ED Notes (Signed)
Patient transported to X-ray 

## 2018-05-03 NOTE — Telephone Encounter (Signed)
Patient states she was seen last week and instructed to stay at home- patient is coughing and her breathing is worse.  Coughing is worse- patient is using cough syrup and tablets- are not controlling cough at all. Patient states coughing takes her breath away- and during the day at times- she feels she is taking shorter breaths. No fever reported. Call to office- per office go ahead and send patient to ED for evaluation. Patient agrees to go.  Reason for Disposition . Wheezing is present    Patient is having increasing coughing spells and cough suppressant medication is not helping- patient has started having symptoms with her breathing now and her sputum is green/gray. Call to office-PCP not in office- patient directed to ED.  Answer Assessment - Initial Assessment Questions 1. ONSET: "When did the cough begin?"      12 days 2. SEVERITY: "How bad is the cough today?"      Constant- every 10 minutes 3. RESPIRATORY DISTRESS: "Describe your breathing."      Coughing is taking breath away- at times patient feels like she is taking shorter breaths 4. FEVER: "Do you have a fever?" If so, ask: "What is your temperature, how was it measured, and when did it start?"     No fever 5. SPUTUM: "Describe the color of your sputum" (clear, white, yellow, green)     Greenish/tan 6. HEMOPTYSIS: "Are you coughing up any blood?" If so ask: "How much?" (flecks, streaks, tablespoons, etc.)     No blood noted 7. CARDIAC HISTORY: "Do you have any history of heart disease?" (e.g., heart attack, congestive heart failure)      no 8. LUNG HISTORY: "Do you have any history of lung disease?"  (e.g., pulmonary embolus, asthma, emphysema)     no 9. PE RISK FACTORS: "Do you have a history of blood clots?" (or: recent major surgery, recent prolonged travel, bedridden)     no 10. OTHER SYMPTOMS: "Do you have any other symptoms?" (e.g., runny nose, wheezing, chest pain)       some wheezing between coughing, nasal  congestion 11. PREGNANCY: "Is there any chance you are pregnant?" "When was your last menstrual period?"       No- LMP- 04/24/18 12. TRAVEL: "Have you traveled out of the country in the last month?" (e.g., travel history, exposures)       No travel, no exposure known  Protocols used: COUGH - ACUTE PRODUCTIVE-A-AH

## 2018-05-03 NOTE — ED Notes (Signed)
ED Provider at bedside. 

## 2018-05-03 NOTE — ED Triage Notes (Addendum)
Patient reports to ER for increased shortness of breath and cough.  States seen by PCP last week and given cough syrup and another "tablet". Denies exposure to any with covid-19.  Reports cough as productive.  Ambulatory to room in NAD.  Denies fevers at home.

## 2018-05-03 NOTE — ED Provider Notes (Signed)
[facility] EMERGENCY DEPARTMENT Provider Note   CSN: [id number] Arrival date & time: 05/03/18  1038    History   Chief Complaint Chief Complaint  Patient presents with  . Cough    HPI Shannon Dyer is a 38 y.o. Dyer.     HPI   Shannon Dyer is a 38 y/o Dyer with a h/o migraine, panic attacks, tobacco abuse who presents to the ED today c/o cough, sob. Shannon Dyer states symptoms started about a Dyer ago. Sxs have worsened since onset. States cough is productive with green/brown sputum. Also c/o wheezing. No chest pain or pain with inspiration. Also c/o rhinorrhea, congestion and sore throat. Also initially had fevers which have resolved. Has been taking ibuprofen, codeine syrup, and delsym.   Denies leg pain/swelling, hemoptysis, recent surgery/trauma, recent long travel, hormone use, personal hx of cancer, or hx of DVT/PE.   No recent foreign travel or known COVID exposures.  Shannon Dyer had telehealth visit today and was c/o sob so Shannon Dyer was advised to come to ed by pcp.   Past Medical History:  Diagnosis Date  . Migraines   . Panic attacks   . Tobacco abuse     Patient Active Problem List   Diagnosis Date Noted  . Viral URI with cough 04/24/2018  . Acute sinusitis 04/12/2018  . Migraines   . Neck strain, initial encounter 11/27/2017  . Headache 09/24/2017  . Dizziness 09/24/2017  . Skin lesion 09/24/2017  . Smoker 08/11/2016  . 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 Dyspepsia 02/14/2016  . Myalgia 11/21/2012  . Encounter for routine gynecological examination 05/22/2012  . Dyer fertility problem 05/22/2012  . BACK PAIN 02/16/2010  . ANXIETY DEPRESSION 06/30/2008  . GANGLION CYST, WRIST, LEFT 07/25/2007    Past Surgical History:  Procedure Laterality Date  . cystic ovarian mass  01/2005  . ELBOW LIGAMENT RECONSTRUCTION       OB History    Gravida  0   Para      Term      Preterm      AB      Living        SAB      TAB      Ectopic      Multiple       Live Births               Home Medications    Prior to Admission medications   Medication Sig Start Date End Date Taking? Authorizing Provider  benzonatate (TESSALON) 100 MG capsule Take 1 capsule (100 mg total) by mouth every 8 (eight) hours for 5 days. 05/03/18 05/08/18  Loa Idler S, PA-C  guaiFENesin-codeine (ROBITUSSIN AC) 100-10 MG/5ML syrup Take 5 mLs by mouth 4 (four) times daily as needed for cough. When not working or driving 4/09/813/25/20   Tower, Audrie GallusMarne A, MD    Family History Family History  Problem Relation Age of Onset  . Hypertension Father 4460       died of unknown causes-autopsy  . COPD Father   . Asthma Sister   . Breast cancer Neg Hx     Social History Social History   Tobacco Use  . Smoking status: Current Every Day Smoker    Packs/day: 0.50    Years: 7.00    Pack years: 3.50    Types: Cigarettes  . Smokeless tobacco: Never Used  Substance Use Topics  . Alcohol use: Yes    Alcohol/Dyer: 0.0 standard drinks  Comment: occasionally  . Drug use: No     Allergies   Patient has no known allergies.   Review of Systems Review of Systems  Constitutional: Positive for chills, diaphoresis and fever (resolved).  HENT: Positive for congestion, rhinorrhea and sore throat. Negative for ear pain.   Eyes: Negative for visual disturbance.  Respiratory: Positive for cough, shortness of breath and wheezing.   Cardiovascular: Negative for chest pain, palpitations and leg swelling.  Gastrointestinal: Positive for diarrhea. Negative for abdominal pain, constipation, nausea and vomiting.  Genitourinary: Negative for dysuria, flank pain and hematuria.  Musculoskeletal: Positive for myalgias.  Skin: Negative for rash.  Neurological: Negative for headaches.  All other systems reviewed and are negative.    Physical Exam Updated Vital Signs BP 124/89 (BP Location: Right Arm)   Pulse (!) 103   Temp 98.9 F (37.2 C) (Oral)   Resp 20   Ht 5\' 3"  (1.6 m)    Wt 66.7 kg   LMP 04/24/2018 (Approximate)   SpO2 98%   BMI 26.04 kg/m   Physical Exam Vitals signs and nursing note reviewed.  Constitutional:      General: Shannon Dyer is not in acute distress.    Appearance: Shannon Dyer is well-developed. Shannon Dyer is not ill-appearing or toxic-appearing.  HENT:     Head: Normocephalic and atraumatic.     Nose: Nose normal. No congestion.     Mouth/Throat:     Mouth: Mucous membranes are moist.     Pharynx: No oropharyngeal exudate or posterior oropharyngeal erythema.     Comments: No tonsillar edema or exudates Eyes:     Conjunctiva/sclera: Conjunctivae normal.  Neck:     Musculoskeletal: Neck supple.  Cardiovascular:     Rate and Rhythm: Normal rate and regular rhythm.     Heart sounds: Normal heart sounds. No murmur.     Comments: Hr 80-90s on monitor Pulmonary:     Effort: Pulmonary effort is normal. No respiratory distress.     Breath sounds: Normal breath sounds. No stridor. No wheezing, rhonchi or rales.  Abdominal:     General: Bowel sounds are normal.     Palpations: Abdomen is soft.     Tenderness: There is no abdominal tenderness. There is no guarding or rebound.  Musculoskeletal:        General: No tenderness.     Right lower leg: No edema.     Left lower leg: No edema.  Lymphadenopathy:     Cervical: No cervical adenopathy.  Skin:    General: Skin is warm and dry.  Neurological:     Mental Status: Shannon Dyer is alert.      ED Treatments / Results  Labs (all labs ordered are listed, but only abnormal results are displayed) Labs Reviewed - No data to display  EKG None  Radiology Dg Chest 2 View  Result Date: 05/03/2018 CLINICAL DATA:  Cough.  Shortness of breath since last Dyer. EXAM: CHEST - 2 VIEW COMPARISON:  03/13/2017 FINDINGS: The heart size and mediastinal contours are within normal limits. Both lungs are clear. No pleural effusion or pneumothorax. The visualized skeletal structures are unremarkable. IMPRESSION: Normal chest  radiographs. Electronically Signed   By: Amie Portland M.D.   On: 05/03/2018 11:23    Procedures Procedures (including critical care time)  Medications Ordered in ED Medications  albuterol (PROVENTIL HFA;VENTOLIN HFA) 108 (90 Base) MCG/ACT inhaler 8 puff (8 puffs Inhalation Given 05/03/18 1145)  AeroChamber Plus Flo-Vu Medium MISC 1 each (1 each Other Given  05/03/18 1145)     Initial Impression / Assessment and Dyer / ED Course  I have reviewed the triage vital signs and the nursing notes.  Pertinent labs & imaging results that were available during my care of the patient were reviewed by me and considered in my medical decision making (see chart for details).     Final Clinical Impressions(s) / ED Diagnoses   Final diagnoses:  Viral URI with cough   Shannon Dyer. Tx with ibuprofen and cough medicine w/o relief. Was advised to come to ed by pcp after c/o sob on telehealth visit. Nontoxic and nonseptic appearing here. NAD. Afebrile here, initially marginally tachycardic however normal HR on my exam. No tachypnea, lungs ctab. satting at 100% on RA. Will obtain cxr to r/o pna. Will give albuterol tx and reassess.   cxr is negative for pneumonia On reassessment Shannon Dyer feels improved after albuterol inhaler.   Suspect viral syndrome. Shannon Dyer does not meet testing criteria for COVID as this time. Shannon Dyer is out of window for tamiflu therefor flu testing deferred. Shannon Dyer has already self quarantined for 7 days but due to her continued/worsening cough I have advised her to continue quarantining. Advised close monitoring of sxs, pcp f/u earlier next Dyer, and to return to the ed for new or worsening sxs. Shannon Dyer and reasons to return. All questions answered. Shannon Dyer stable for d/c.  Binah Abella was evaluated in Emergency Department on 05/03/2018 for the symptoms described in the history of present illness. Shannon Dyer was evaluated in the context of the global COVID-19  pandemic, which necessitated consideration that the patient might be at risk for infection with the SARS-CoV-2 virus that causes COVID-19. Institutional protocols and algorithms that pertain to the evaluation of patients at risk for COVID-19 are in a state of rapid change based on information released by regulatory bodies including the CDC and federal and state organizations. These policies and algorithms were followed during the patient's care in the ED.   ED Discharge Orders         Ordered    benzonatate (TESSALON) 100 MG capsule  Every 8 hours     05/03/18 1218           Rayne Du 05/03/18 1218    Azalia Bilis, MD 05/03/18 1531

## 2018-05-06 ENCOUNTER — Other Ambulatory Visit: Payer: Self-pay

## 2018-05-07 ENCOUNTER — Other Ambulatory Visit: Payer: Self-pay

## 2018-05-14 ENCOUNTER — Encounter (HOSPITAL_COMMUNITY): Payer: Self-pay

## 2018-05-14 ENCOUNTER — Ambulatory Visit: Payer: Managed Care, Other (non HMO) | Admitting: Family Medicine

## 2018-05-14 ENCOUNTER — Telehealth: Payer: Self-pay

## 2018-05-14 ENCOUNTER — Ambulatory Visit (INDEPENDENT_AMBULATORY_CARE_PROVIDER_SITE_OTHER): Payer: Managed Care, Other (non HMO)

## 2018-05-14 ENCOUNTER — Other Ambulatory Visit: Payer: Self-pay

## 2018-05-14 ENCOUNTER — Emergency Department (HOSPITAL_COMMUNITY)
Admission: EM | Admit: 2018-05-14 | Discharge: 2018-05-15 | Disposition: A | Discharge status: Home / Self Care | Payer: Managed Care, Other (non HMO) | Attending: Emergency Medicine | Admitting: Emergency Medicine

## 2018-05-14 ENCOUNTER — Ambulatory Visit (INDEPENDENT_AMBULATORY_CARE_PROVIDER_SITE_OTHER)
Admission: EM | Admit: 2018-05-14 | Discharge: 2018-05-14 | Disposition: A | Discharge status: Other Acute Inpatient Hospital | Payer: Managed Care, Other (non HMO) | Source: Home / Self Care | Attending: Family Medicine | Admitting: Family Medicine

## 2018-05-14 ENCOUNTER — Encounter (HOSPITAL_COMMUNITY): Payer: Self-pay | Admitting: Emergency Medicine

## 2018-05-14 DIAGNOSIS — J40 Bronchitis, not specified as acute or chronic: Secondary | ICD-10-CM

## 2018-05-14 DIAGNOSIS — R05 Cough: Secondary | ICD-10-CM

## 2018-05-14 DIAGNOSIS — F17210 Nicotine dependence, cigarettes, uncomplicated: Secondary | ICD-10-CM | POA: Diagnosis not present

## 2018-05-14 DIAGNOSIS — Z79899 Other long term (current) drug therapy: Secondary | ICD-10-CM | POA: Insufficient documentation

## 2018-05-14 DIAGNOSIS — R0602 Shortness of breath: Secondary | ICD-10-CM

## 2018-05-14 DIAGNOSIS — J209 Acute bronchitis, unspecified: Secondary | ICD-10-CM | POA: Diagnosis not present

## 2018-05-14 LAB — COMPREHENSIVE METABOLIC PANEL
ALT: 8 U/L (ref 0–44)
AST: 16 U/L (ref 15–41)
Albumin: 3.6 g/dL (ref 3.5–5.0)
Alkaline Phosphatase: 68 U/L (ref 38–126)
Anion gap: 14 (ref 5–15)
BUN: 7 mg/dL (ref 6–20)
CO2: 18 mmol/L — ABNORMAL LOW (ref 22–32)
Calcium: 9 mg/dL (ref 8.9–10.3)
Chloride: 104 mmol/L (ref 98–111)
Creatinine, Ser: 0.95 mg/dL (ref 0.44–1.00)
GFR calc Af Amer: >60 mL/min (ref >60)
GFR calc non Af Amer: >60 mL/min (ref >60)
Glucose, Bld: 85 mg/dL (ref 70–99)
Potassium: 4.3 mmol/L (ref 3.5–5.1)
Sodium: 136 mmol/L (ref 135–145)
Total Bilirubin: 0.4 mg/dL (ref 0.3–1.2)
Total Protein: 6.5 g/dL (ref 6.5–8.1)

## 2018-05-14 LAB — CBC WITH DIFFERENTIAL/PLATELET
Abs Immature Granulocytes: 0.01 10*3/uL (ref 0.00–0.07)
Basophils Absolute: 0 10*3/uL (ref 0.0–0.1)
Basophils Relative: 1 %
Eosinophils Absolute: 0 10*3/uL (ref 0.0–0.5)
Eosinophils Relative: 1 %
HCT: 51.3 % — ABNORMAL HIGH (ref 36.0–46.0)
Hemoglobin: 17 g/dL — ABNORMAL HIGH (ref 12.0–15.0)
Immature Granulocytes: 0 %
Lymphocytes Relative: 32 %
Lymphs Abs: 2 10*3/uL (ref 0.7–4.0)
MCH: 31.3 pg (ref 26.0–34.0)
MCHC: 33.1 g/dL (ref 30.0–36.0)
MCV: 94.5 fL (ref 80.0–100.0)
Monocytes Absolute: 0.7 10*3/uL (ref 0.1–1.0)
Monocytes Relative: 11 %
Neutro Abs: 3.6 10*3/uL (ref 1.7–7.7)
Neutrophils Relative %: 55 %
Platelets: 283 10*3/uL (ref 150–400)
RBC: 5.43 MIL/uL — ABNORMAL HIGH (ref 3.87–5.11)
RDW: 12.6 % (ref 11.5–15.5)
WBC: 6.3 10*3/uL (ref 4.0–10.5)
nRBC: 0 % (ref 0.0–0.2)

## 2018-05-14 LAB — I-STAT BETA HCG BLOOD, ED (MC, WL, AP ONLY): I-stat hCG, quantitative: <5 m[IU]/mL (ref <5)

## 2018-05-14 MED ORDER — PREDNISONE 20 MG PO TABS
60.0000 mg | ORAL_TABLET | Freq: Once | ORAL | Status: AC
  Administered 2018-05-14: 60 mg via ORAL
  Filled 2018-05-14: qty 3

## 2018-05-14 MED ORDER — ALBUTEROL SULFATE HFA 108 (90 BASE) MCG/ACT IN AERS
8.0000 | INHALATION_SPRAY | Freq: Once | RESPIRATORY_TRACT | Status: AC
  Administered 2018-05-14: 23:00:00 8 via RESPIRATORY_TRACT
  Filled 2018-05-14: qty 6.7

## 2018-05-14 MED ORDER — AEROCHAMBER PLUS FLO-VU LARGE MISC
1.0000 | Freq: Once | Status: AC
  Administered 2018-05-14: 23:00:00 1

## 2018-05-14 NOTE — Telephone Encounter (Signed)
Sent message to front desk staff to un-arrive her and cancel appt

## 2018-05-14 NOTE — ED Triage Notes (Signed)
Pt presents with cough that is productive with green phlegm, shortness of breath and runny nose that is clear.  Pt reports symptoms began 4/3.  Pt denies any fever.

## 2018-05-14 NOTE — Discharge Instructions (Signed)
I believe you need to be seen in the ER For additional testing like blood work It is possible that you have the novel coronavirus infection, remain cautious regarding contagion.

## 2018-05-14 NOTE — ED Provider Notes (Signed)
[facility], [facility], [facility] EMERGENCY DEPARTMENT Provider Note   CSN: [id number] Arrival date & time: 05/14/18  2009    History   Chief Complaint Chief Complaint  Patient presents with  . Cough    HPI Shannon Dyer is a 38 y.o. female history of tobacco abuse disorder, bronchitis, and migraines who presents to the emergency department with a chief complaint of shortness of breath.  The patient reports a productive cough with intermittent green sputum for the last 2 weeks that has progressively worsened since onset.  She reports that she is also developed shortness of breath that has also continued to worsen over the last week and clear rhinorrhea.  Per chart review, the patient was seen in the ER on 05/03/2018 and was endorsing a cough and shortness of breath that have been ongoing for the last week with wheezing, rhinorrhea, congestion, and sore throat.  She initially had a fevers, which have resolved with ibuprofen, codeine syrup, and Delsym.  She denies fever, headache, neck pain, leg swelling, orthopnea, palpitations, back pain, neck pain, nausea, vomiting, diarrhea.  She reports that she has been using 2 puffs of her albuterol inhaler every few hours and treating her cough with Tessalon Perles.  Last use her inhaler at 1600.  She reports that she has used it 5 times today.   She reports that she is a 0.5 PPD smoker, but has been trying to cut back to a couple cigarettes daily over the last week.  She reports a history of bronchitis and states that her current symptoms feel similar.  No recent travel.  No known sick or suspected contact with COVID-19.     The history is provided by the patient. No language interpreter was used.    Past Medical History:  Diagnosis Date  . Migraines   . Panic attacks   . Tobacco abuse     Patient Active Problem List   Diagnosis Date Noted  . Viral URI with cough 04/24/2018  . Acute sinusitis 04/12/2018  . Migraines   . Neck strain,  initial encounter 11/27/2017  . Headache 09/24/2017  . Dizziness 09/24/2017  . Skin lesion 09/24/2017  . Smoker 08/11/2016  . 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 Dyspepsia 02/14/2016  . Myalgia 11/21/2012  . Encounter for routine gynecological examination 05/22/2012  . 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 BACK PAIN 02/16/2010  . ANXIETY DEPRESSION 06/30/2008  . GANGLION CYST, WRIST, LEFT 07/25/2007    Past Surgical History:  Procedure Laterality Date  . cystic ovarian mass  01/2005  . ELBOW LIGAMENT RECONSTRUCTION       OB History    Gravida  0   Para      Term      Preterm      AB      Living        SAB      TAB      Ectopic      Multiple      Live Births               Home Medications    Prior to Admission medications   Medication Sig Start Date End Date Taking? Authorizing Provider  Albuterol Sulfate (PROVENTIL HFA IN) Inhale 2 puffs into the lungs daily as needed (shortness of breath).    Yes [provider]  predniSONE (STERAPRED UNI-PAK 21 TAB) 10 MG (21) TBPK tablet Take by mouth daily. Take 6 tabs by mouth daily  for 2 days,  then 5 tabs for 2 days, then 4 tabs for 2 days, then 3 tabs for 2 days, 2 tabs for 2 days, then 1 tab by mouth daily for 2 days 05/15/18   Dajia Gunnels A, PA-C  promethazine-dextromethorphan (PROMETHAZINE-DM) 6.25-15 MG/5ML syrup Take 5 mLs by mouth 4 (four) times daily as needed for cough. 05/15/18   Darrien Laakso A, PA-C    Family History Family History  Problem Relation Age of Onset  . Hypertension Father 30       died of unknown causes-autopsy  . COPD Father   . Asthma Sister   . Breast cancer Neg Hx     Social History Social History   Tobacco Use  . Smoking status: Current Every Day Smoker    Packs/day: 0.50    Years: 7.00    Pack years: 3.50    Types: Cigarettes  . Smokeless tobacco: Never Used  Substance Use Topics  . Alcohol use: Yes    Alcohol/week: 0.0 standard drinks    Comment:  occasionally  . Drug use: No     Allergies   Patient has no known allergies.   Review of Systems Review of Systems  Constitutional: Negative for activity change and fever.  HENT: Positive for rhinorrhea. Negative for congestion, ear pain, facial swelling, sneezing, tinnitus and voice change.   Respiratory: Positive for cough, shortness of breath and wheezing.   Cardiovascular: Negative for chest pain, palpitations and leg swelling.  Gastrointestinal: Negative for abdominal pain, diarrhea, nausea and vomiting.  Genitourinary: Negative for dysuria, flank pain and urgency.  Musculoskeletal: Negative for back pain.  Skin: Negative for rash.  Allergic/Immunologic: Negative for immunocompromised state.  Neurological: Negative for dizziness, seizures, weakness and headaches.  Psychiatric/Behavioral: Negative for confusion.     Physical Exam Updated Vital Signs BP 109/75 (BP Location: Right Arm)   Pulse 85   Temp 98.7 F (37.1 C) (Oral)   Resp (!) 22   Ht  (1.6 m)   Wt 66.2 kg   LMP 04/24/2018 (Approximate)   SpO2 100%   BMI 25.86 kg/m   Physical Exam Vitals signs and nursing note reviewed.  Constitutional:      General: She is not in acute distress.    Appearance: She is not ill-appearing, toxic-appearing or diaphoretic.     Comments: Appears older than stated age  HENT:     Head: Normocephalic.  Eyes:     Conjunctiva/sclera: Conjunctivae normal.  Neck:     Musculoskeletal: Normal range of motion and neck supple. No neck rigidity or muscular tenderness.     Vascular: No carotid bruit.  Cardiovascular:     Rate and Rhythm: Normal rate and regular rhythm.     Heart sounds: No murmur. No friction rub. No gallop.   Pulmonary:     Effort: Pulmonary effort is normal. No respiratory distress.     Breath sounds: Wheezing present.     Comments: Patient appears short of breath with mild increased work of breathing.  Lung sounds are diminished bilaterally with fine  wheezes in the bilateral mid lungs and glucose.  No retractions, accessory muscle use, or nasal flaring. Abdominal:     General: There is no distension.     Palpations: Abdomen is soft. There is no mass.     Tenderness: There is no abdominal tenderness. There is no right CVA tenderness, left CVA tenderness, guarding or rebound.     Hernia: No hernia is present.  Lymphadenopathy:     Cervical:  No cervical adenopathy.  Skin:    General: Skin is warm.     Findings: No rash.  Neurological:     Mental Status: She is alert.  Psychiatric:        Behavior: Behavior normal.      ED Treatments / Results  Labs (all labs ordered are listed, but only abnormal results are displayed) Labs Reviewed  CBC WITH DIFFERENTIAL/PLATELET - Abnormal; Notable for the following components:      Result Value   RBC 5.43 (*)    Hemoglobin 17.0 (*)    HCT 51.3 (*)    All other components within normal limits  COMPREHENSIVE METABOLIC PANEL - Abnormal; Notable for the following components:   CO2 18 (*)    All other components within normal limits  I-STAT BETA HCG BLOOD, ED ([facility], [facility], [facility] ONLY)    EKG None  Radiology Dg Chest 2 View  Result Date: 05/14/2018 CLINICAL DATA:  Shortness of breath and cough. EXAM: CHEST - 2 VIEW COMPARISON:  05/03/2018 FINDINGS: Lungs are mildly hyperexpanded. The lungs are clear without focal pneumonia, edema, pneumothorax or pleural effusion. The cardiopericardial silhouette is within normal limits for size. The visualized bony structures of the thorax are intact. IMPRESSION: No acute cardiopulmonary findings. Electronically Signed   By: Kennith Center M.D.   On: 05/14/2018 20:07    Procedures Procedures (including critical care time)  Medications Ordered in ED Medications  albuterol (PROVENTIL HFA;VENTOLIN HFA) 108 (90 Base) MCG/ACT inhaler 8 puff (8 puffs Inhalation Given 05/14/18 2254)  AeroChamber Plus Flo-Vu Large MISC 1 each (1 each Other Given 05/14/18 2255)   predniSONE (DELTASONE) tablet 60 mg (60 mg Oral Given 05/14/18 2254)     Initial Impression / Assessment and Plan / ED Course  I have reviewed the triage vital signs and the nursing notes.  Pertinent labs & imaging results that were available during my care of the patient were reviewed by me and considered in my medical decision making (see chart for details).        38 year old female with history of tobacco use disorder, dyspepsia, and migraines presenting with several weeks of productive cough with green sputum, clear rhinorrhea, and progressively worsening shortness of breath.  The patient was seen in the ER for similar on 05/03/2018 and endorsed subjective fevers at that time, but denies of fevers over the last 2 weeks; however, the patient has been taking OTC Tylenol in addition to [employer] and albuterol inhaler.  No known or suspected COVID-19 contacts.  On exam, the patient clinically appears a short of breath, but does not have respiratory distress.  Lung sounds are diminished bilaterally with fine wheezes in the mid lungs and bases bilaterally.  Will give albuterol inhaler and check basic labs given length of symptoms to ensure the patient is not acidotic.  BP 89/69.  On recheck, 109/75.  She does not clinically appear hypovolemic.  Chest x-ray from urgent care has been reviewed with mild hyperexpansion of the lungs.  I suspect the patient is having a COPD exacerbation versus bronchitis.  Lower suspicion for pneumonia, asthma exacerbation, COVID-19, acute bacterial sinusitis, ACS, or pneumothorax.  On reevaluation after albuterol inhaler with spacer and prednisone, breath sounds are clear.  Work of breathing has significantly improved.  The patient reports that she is feeling much better.  She was ambulated in the ER without hypoxia.  Blood work was reassuring.  Patient was discussed with Dr. Rodena Medin, attending physician.  We will discharge the patient  home with albuterol inhaler with  spacer and a prednisone taper.  She has been advised to follow-up with primary care.  Blood pressures have remained stable.  She is in no acute distress.  Safe discharge home with outpatient follow-up at this time.  Final Clinical Impressions(s) / ED Diagnoses   Final diagnoses:  Bronchitis    ED Discharge Orders         Ordered    promethazine-dextromethorphan (PROMETHAZINE-DM) 6.25-15 MG/5ML syrup  4 times daily PRN     05/15/18 0022    predniSONE (STERAPRED UNI-PAK 21 TAB) 10 MG (21) TBPK tablet  Daily     05/15/18 0022           Desmond Szabo A, PA-C 05/15/18 0526    Wynetta FinesMessick, Peter C, MD 05/15/18 [id number]

## 2018-05-14 NOTE — Telephone Encounter (Signed)
Sent to ED or UC based on severity of symptoms  Please cancel 3 pm appt thanks

## 2018-05-14 NOTE — Telephone Encounter (Signed)
I spoke to her and agree she sounds sob and is worsening  I adv her to go to ED or UC and she agreed (has not decided which one yet)  Enc her to call back if needed I think she needs to be examined  Please cancel her 3 pm appt  Thanks

## 2018-05-14 NOTE — ED Triage Notes (Signed)
Pt states she was seen in the ER on 4/3 for cough sob, states she hasn't really gotten better since, stating she was given an inhaler and been using it without relief. Pt difficulty talking without getting SOB, sob with ambulation.

## 2018-05-14 NOTE — ED Provider Notes (Signed)
[facility]    CSN: [id number] Arrival date & time: 05/14/18  1844     History   Chief Complaint Chief Complaint  Patient presents with  . Shortness of Breath    HPI Shannon Dyer is a 38 y.o. female.   HPI  Patient was seen on 05/03/2018 for upper respiratory infection.  It was discussed that she likely had a viral upper respiratory infection she was advised to go home and quarantine for 7 days.  There was a discussion whether she had had exposure to COVID-19 or whether she might have this infection, but it was decided unlikely at that time.  When she presented to the ER she had had symptoms for 7 days.  She is here today, after another 11 days of home care.  She states that she continues to have shortness of breath and it is worsening over time.  She continues to have cough.  She continues to have periods where she thinks she has a fever.  She does not have a thermometer at home.  She is using her albuterol frequently.  She states she used it 4 times this morning for shortness of breath.  When she has coughing spells she feels like she cannot breathe.  No dizziness.  She is short of breath at rest and more short of breath with ambulation. She has called her primary care doctor a couple of times.  Advised her on her medical care during quarantine.  They advised her to come to the ER or urgent care center today for reevaluation.  Past Medical History:  Diagnosis Date  . Migraines   . Panic attacks   . Tobacco abuse     Patient Active Problem List   Diagnosis Date Noted  . Viral URI with cough 04/24/2018  . Acute sinusitis 04/12/2018  . Migraines   . Neck strain, initial encounter 11/27/2017  . Headache 09/24/2017  . Dizziness 09/24/2017  . Skin lesion 09/24/2017  . Smoker 08/11/2016  . 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 Dyspepsia 02/14/2016  . Myalgia 11/21/2012  . Encounter for routine gynecological examination 05/22/2012  . 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 problem  05/22/2012  . BACK PAIN 02/16/2010  . ANXIETY DEPRESSION 06/30/2008  . GANGLION CYST, WRIST, LEFT 07/25/2007    Past Surgical History:  Procedure Laterality Date  . cystic ovarian mass  01/2005  . ELBOW LIGAMENT RECONSTRUCTION      OB History    Gravida  0   Para      Term      Preterm      AB      Living        SAB      TAB      Ectopic      Multiple      Live Births               Home Medications    Prior to Admission medications   Medication Sig Start Date End Date Taking? Authorizing Provider  Acetaminophen (TYLENOL PO) Take by mouth every 4 (four) hours as needed.    [provider]  Albuterol Sulfate (PROVENTIL HFA IN) Inhale into the lungs as needed.    [provider]    Family History Family History  Problem Relation Age of Onset  . Hypertension Father 18       died of unknown causes-autopsy  . COPD Father   . Asthma Sister   . Breast cancer Neg Hx  Social History Social History   Tobacco Use  . Smoking status: Current Every Day Smoker    Packs/day: 0.50    Years: 7.00    Pack years: 3.50    Types: Cigarettes  . Smokeless tobacco: Never Used  Substance Use Topics  . Alcohol use: Yes    Alcohol/week: 0.0 standard drinks    Comment: occasionally  . Drug use: No     Allergies   Patient has no known allergies.   Review of Systems Review of Systems  Constitutional: Positive for appetite change, chills, fatigue and fever.  HENT: Positive for postnasal drip. Negative for ear pain and sore throat.   Eyes: Negative for pain and visual disturbance.  Respiratory: Positive for shortness of breath. Negative for cough.        Worsening shortness  Cardiovascular: Negative for chest pain and palpitations.  Gastrointestinal: Positive for nausea. Negative for abdominal pain and vomiting.  Genitourinary: Negative for dysuria and hematuria.  Musculoskeletal: Negative for arthralgias and back pain.  Skin: Negative  for color change and rash.  Neurological: Negative for seizures and syncope.  Psychiatric/Behavioral: The patient is nervous/anxious.        "scared to be at home alone"  All other systems reviewed and are negative.    Physical Exam Triage Vital Signs ED Triage Vitals [05/14/18 1908]  Enc Vitals Group     BP 117/90     Pulse Rate (!) 121     Resp 18     Temp 99 F (37.2 C)     Temp src      SpO2 100 %     Weight      Height      Head Circumference      Peak Flow      Pain Score 0     Pain Loc      Pain Edu?      Excl. in GC?    No data found.  Updated Vital Signs BP 117/90   Pulse (!) 121   Temp 99 F (37.2 C)   Resp (!) 28   LMP 04/24/2018 (Approximate)   SpO2 100%      Physical Exam Constitutional:      General: She is in acute distress.     Appearance: She is well-developed and normal weight. She is ill-appearing.     Comments: Anxious.  Dyspneic with conversation  HENT:     Head: Normocephalic and atraumatic.     Mouth/Throat:     Comments: Mucous membranes slightly dry Eyes:     Conjunctiva/sclera: Conjunctivae normal.     Pupils: Pupils are equal, round, and reactive to light.  Neck:     Musculoskeletal: Normal range of motion.  Cardiovascular:     Rate and Rhythm: Tachycardia present.     Heart sounds: Normal heart sounds.  Pulmonary:     Effort: Pulmonary effort is normal. Tachypnea present. No accessory muscle usage.     Breath sounds: Normal breath sounds. No decreased breath sounds, wheezing, rhonchi or rales.  Chest:     Chest wall: No tenderness.  Abdominal:     General: Bowel sounds are normal. There is no distension.     Palpations: Abdomen is soft.     Tenderness: There is no abdominal tenderness.  Musculoskeletal: Normal range of motion.     Right lower leg: She exhibits no tenderness. No edema.     Left lower leg: She exhibits no tenderness. No edema.  Lymphadenopathy:  Cervical: No cervical adenopathy.  Skin:    General:  Skin is warm and dry.     Coloration: Skin is not cyanotic or pale.  Neurological:     General: No focal deficit present.     Mental Status: She is alert.  Psychiatric:        Mood and Affect: Mood is anxious.      UC Treatments / Results  Labs (all labs ordered are listed, but only abnormal results are displayed) Labs Reviewed - No data to display  EKG None  Radiology Dg Chest 2 View  Result Date: 05/14/2018 CLINICAL DATA:  Shortness of breath and cough. EXAM: CHEST - 2 VIEW COMPARISON:  05/03/2018 FINDINGS: Lungs are mildly hyperexpanded. The lungs are clear without focal pneumonia, edema, pneumothorax or pleural effusion. The cardiopericardial silhouette is within normal limits for size. The visualized bony structures of the thorax are intact. IMPRESSION: No acute cardiopulmonary findings. Electronically Signed   By: Kennith Center M.D.   On: 05/14/2018 20:07    Procedures Procedures (including critical care time)  Medications Ordered in UC Medications - No data to display  Initial Impression / Assessment and Plan / UC Course  I have reviewed the triage vital signs and the nursing notes.  Pertinent labs & imaging results that were available during my care of the patient were reviewed by me and considered in my medical decision making (see chart for details).     Patient has subjective dyspnea with no lung findings on examination and a normal chest x-ray.  She has a high level of concern she may have COVID-19.  I think anxiety is part of her symptom complex.  Unfortunately with tachypnea and tachycardia with her coughing symptoms I believe she needs additional blood work [employer].  I am sending to the emergency room for evaluation. Final Clinical Impressions(s) / UC Diagnoses   Final diagnoses:  Shortness of breath     Discharge Instructions     I believe you need to be seen in the ER For additional testing like blood work It is possible that you have the novel  coronavirus infection, remain cautious regarding contagion.    ED Prescriptions    None     Controlled Substance Prescriptions [place] Controlled Substance Registry consulted? No   Eustace Moore, MD 05/14/18 2019

## 2018-05-14 NOTE — Telephone Encounter (Signed)
Inbound call to triage - increased SOB, cough (greenish sputum), chest tightness, wheezing, nausea, nasal discharge (greenish)  Denies fever, headache, back pain, neck pain, vomiting, diarrhea  Using Proventil inhaler for wheezing. Used 5x today.  Taking Tylenol OTC every 4 hours.   Unable to assess BP and O2 sats.   Unable to complete virtual video appt. Phone appt scheduled with PCP on 05/14/18 @ 1500.

## 2018-05-15 MED ORDER — PREDNISONE 10 MG (21) PO TBPK: ORAL_TABLET | Freq: Every day | ORAL | 0 refills | Status: DC

## 2018-05-15 MED ORDER — PROMETHAZINE-DM 6.25-15 MG/5ML PO SYRP: 5.0000 mL | ORAL_SOLUTION | Freq: Four times a day (QID) | ORAL | 0 refills | Status: DC | PRN

## 2018-05-15 NOTE — ED Notes (Signed)
Pt ambulated w/ O2, stable at this time.  O2 level remained above 99%

## 2018-05-15 NOTE — Discharge Instructions (Addendum)
Thank you for allowing me to care for you today in the Emergency Department.   Use 2 puffs of the albuterol inhaler with a spacer every 6 hours as needed for shortness of breath.  Take 5 mL's of Promethazine DM every 6 hours as needed for cough.  Starting tomorrow take 6 tabs by mouth daily  for 2 days, then 5 tabs for 2 days, then 4 tabs for 2 days, then 3 tabs for 2 days, 2 tabs for 2 days, then 1 tab by mouth daily for 2 days.  Call and schedule a follow-up appointment with your primary care provider for recheck of your symptoms.  Return to the emergency department if you develop respiratory distress, if you have, develop shortness of breath with severe to with a high fever, if your lips or fingertips turn blue, or other new, concerning symptoms.

## 2018-05-22 ENCOUNTER — Other Ambulatory Visit: Payer: Self-pay

## 2018-05-22 ENCOUNTER — Ambulatory Visit (HOSPITAL_COMMUNITY)
Admission: EM | Admit: 2018-05-22 | Discharge: 2018-05-22 | Disposition: A | Discharge status: Home / Self Care | Payer: Managed Care, Other (non HMO) | Attending: Family Medicine | Admitting: Family Medicine

## 2018-05-22 ENCOUNTER — Encounter (HOSPITAL_COMMUNITY): Payer: Self-pay | Admitting: Emergency Medicine

## 2018-05-22 DIAGNOSIS — R51 Headache: Secondary | ICD-10-CM

## 2018-05-22 DIAGNOSIS — R519 Headache, unspecified: Secondary | ICD-10-CM

## 2018-05-22 MED ORDER — DEXAMETHASONE SODIUM PHOSPHATE 10 MG/ML IJ SOLN
10.0000 mg | Freq: Once | INTRAMUSCULAR | Status: AC
  Administered 2018-05-22: 10 mg via INTRAMUSCULAR

## 2018-05-22 MED ORDER — KETOROLAC TROMETHAMINE 60 MG/2ML IM SOLN
60.0000 mg | Freq: Once | INTRAMUSCULAR | Status: AC
  Administered 2018-05-22: 60 mg via INTRAMUSCULAR

## 2018-05-22 MED ORDER — KETOROLAC TROMETHAMINE 60 MG/2ML IM SOLN
INTRAMUSCULAR | Status: AC
  Filled 2018-05-22: qty 2

## 2018-05-22 MED ORDER — DEXAMETHASONE SODIUM PHOSPHATE 10 MG/ML IJ SOLN
INTRAMUSCULAR | Status: AC
  Filled 2018-05-22: qty 1

## 2018-05-22 NOTE — ED Triage Notes (Signed)
Pt here for HA starting today 

## 2018-05-23 ENCOUNTER — Other Ambulatory Visit: Payer: Self-pay

## 2018-05-25 NOTE — ED Provider Notes (Signed)
[facility] CARE CENTER   [id number] 05/22/18 Arrival Time: 1931  ASSESSMENT & PLAN:  1. Bad headache    Work note provided.  Meds ordered this encounter  Medications  . ketorolac (TORADOL) injection 60 mg  . dexamethasone (DECADRON) injection 10 mg   Normal neurological exam. Discussed. Current presentation and symptoms are consistent with prior headaches and are not consistent with SAH, ICH, meningitis, or temporal arteritis. Without fever, focal neuro logical deficits, nuchal rigidity, or change in vision. No indication for neurodiagnostic workup at this time. Discussed.  Follow-up Information    [facility].   Specialty:  Urgent Care Why:  As needed. Contact information: [address] [place] [address] [phone]       [facility] EMERGENCY DEPARTMENT.   Specialty:  Emergency Medicine Why:  If symptoms worsen in any way. Contact information: [address] [id number] mc [place] [address] [phone]         Reviewed expectations re: course of current medical issues. Questions answered. Outlined signs and symptoms indicating need for more acute intervention. Patient verbalized understanding. After Visit Summary given.   SUBJECTIVE:  Shannon Dyer is a 38 y.o. female who presents with complaint of a migraine-like headache. H/O similar before; not very frequently. Reports gradual onset earlier today. Location: right frontal without radiation. History of headaches: yes. Precipitating factors include: none which have been determined. Associated symptoms: Preceding aura: no. Nausea/vomiting: mild nausea without emesis. Vision changes: no. Increased sensitivity to light and to noises: yes. Fever: no. Sinus pressure/congestion: no. Extremity weakness: no. Home treatment has included acetaminophen with little improvement. Current headache has limited normal daily  activities. Left work. Denies depression, dizziness, loss of balance, muscle weakness, numbness of extremities, speech difficulties and vision problems. No head injury reported. Ambulatory without difficulty. Not the worst headache of her life.  ROS: As per HPI. All other systems negative.    OBJECTIVE:  Vitals:   05/22/18 1943  BP: 133/81  Pulse: 88  Resp: 18  Temp: 98.2 F (36.8 C)  TempSrc: Oral  SpO2: 98%    General appearance: alert; no distress; appears fatigued Eyes: PERRLA; EOMI; conjunctiva normal HENT: normocephalic; atraumatic Neck: supple with FROM Lungs: clear to auscultation bilaterally Heart: regular rate and rhythm Extremities: no edema; symmetrical with no gross deformities Skin: warm and dry Neurologic: CN 2-12 grossly intact; normal gait; normal symmetric reflexes; normal extremity strength and sensation throughout Psychological: alert and cooperative; normal mood and affect  No Known Allergies  Past Medical History:  Diagnosis Date  . Migraines   . Panic attacks   . Tobacco abuse    Social History   Socioeconomic History  . Marital status: Divorced    Spouse name: Reita Cliche  . Number of children: 0  . Years of education: Not on file  . Highest education level: 12th grade  Occupational History  . Occupation: Event organiser: [employer]  Social Needs  . Financial resource strain: Not on file  . Food insecurity:    Worry: Not on file    Inability: Not on file  . Transportation needs:    Medical: Not on file    Non-medical: Not on file  Tobacco Use  . Smoking status: Current Every Day Smoker    Packs/day: 0.50    Years: 7.00    Pack years: 3.50    Types: Cigarettes  . Smokeless tobacco: Never Used  Substance and Sexual Activity  .  Alcohol use: Yes    Alcohol/week: 0.0 standard drinks    Comment: occasionally  . Drug use: No  . Sexual activity: Yes    Birth control/protection: None  Lifestyle  . Physical activity:    Days per  week: Not on file    Minutes per session: Not on file  . Stress: Not on file  Relationships  . Social connections:    Talks on phone: Not on file    Gets together: Not on file    Attends religious service: Not on file    Active member of club or organization: Not on file    Attends meetings of clubs or organizations: Not on file    Relationship status: Not on file  . Intimate partner violence:    Fear of current or ex partner: Not on file    Emotionally abused: Not on file    Physically abused: Not on file    Forced sexual activity: Not on file  Other Topics Concern  . Not on file  Social History Narrative   Patient is right-handed, though states she can use either. She lives wirth her husband in a 1 story house, 5 steps to enter. She drinks I cup of coffee and 1 soda a day. She walks several miles a day a work.      Patient is currently separated from her husband. She lives in a 2 level home now. She drinks one cup of coffee and one soda a day.   Family History  Problem Relation Age of Onset  . Hypertension Father 6060       died of unknown causes-autopsy  . COPD Father   . Asthma Sister   . Breast cancer Neg Hx    Past Surgical History:  Procedure Laterality Date  . cystic ovarian mass  01/2005  . ELBOW LIGAMENT RECONSTRUCTION       Mardella LaymanHagler, Sharah Finnell, MD 05/25/18 [phone]

## 2018-05-30 ENCOUNTER — Ambulatory Visit
Admission: RE | Admit: 2018-05-30 | Discharge: 2018-05-30 | Disposition: A | Discharge status: Home / Self Care | Payer: Managed Care, Other (non HMO) | Source: Ambulatory Visit | Attending: Obstetrics & Gynecology | Admitting: Obstetrics & Gynecology

## 2018-05-30 ENCOUNTER — Other Ambulatory Visit: Payer: Self-pay

## 2018-05-30 ENCOUNTER — Other Ambulatory Visit: Payer: Self-pay | Admitting: Obstetrics & Gynecology

## 2018-05-30 DIAGNOSIS — R599 Enlarged lymph nodes, unspecified: Secondary | ICD-10-CM

## 2018-05-30 DIAGNOSIS — N611 Abscess of the breast and nipple: Secondary | ICD-10-CM

## 2018-05-30 DIAGNOSIS — N6320 Unspecified lump in the left breast, unspecified quadrant: Secondary | ICD-10-CM

## 2018-06-06 ENCOUNTER — Other Ambulatory Visit: Payer: Self-pay

## 2018-06-06 ENCOUNTER — Ambulatory Visit
Admission: RE | Admit: 2018-06-06 | Discharge: 2018-06-06 | Disposition: A | Discharge status: Home / Self Care | Payer: Managed Care, Other (non HMO) | Source: Ambulatory Visit | Attending: Obstetrics & Gynecology | Admitting: Obstetrics & Gynecology

## 2018-06-06 ENCOUNTER — Other Ambulatory Visit: Payer: Self-pay | Admitting: Obstetrics & Gynecology

## 2018-06-06 DIAGNOSIS — N611 Abscess of the breast and nipple: Secondary | ICD-10-CM

## 2018-07-15 ENCOUNTER — Ambulatory Visit (INDEPENDENT_AMBULATORY_CARE_PROVIDER_SITE_OTHER): Payer: Managed Care, Other (non HMO) | Admitting: Family Medicine

## 2018-07-15 ENCOUNTER — Other Ambulatory Visit: Payer: Self-pay

## 2018-07-15 ENCOUNTER — Encounter: Payer: Self-pay | Admitting: Family Medicine

## 2018-07-15 VITALS — BP 121/87 | HR 93 | Wt 148.0 lb

## 2018-07-15 DIAGNOSIS — N611 Abscess of the breast and nipple: Secondary | ICD-10-CM

## 2018-07-15 MED ORDER — SULFAMETHOXAZOLE-TRIMETHOPRIM 800-160 MG PO TABS: 1.0000 | ORAL_TABLET | Freq: Two times a day (BID) | ORAL | 0 refills | Status: DC

## 2018-07-15 NOTE — Progress Notes (Signed)
Patient is having reoccurring left  breast abscess. Patient states that it goes away during antibiotic treatment and then comes back painful. Kathrene Alu RN

## 2018-07-15 NOTE — Patient Instructions (Signed)

## 2018-07-15 NOTE — Progress Notes (Signed)
   Subjective:    Patient ID: Shannon Dyer is a 38 y.o. female presenting with No chief complaint on file.  on 07/15/2018  HPI: Seen in March and placed on Bactrim for same. Had imaging then and again in April. First recurrence treated with cleocin. Will resolve but returns. It is red, quite tender. She is not nursing. We were trying to order HSG, and she did not return for this, she will call back with next menses.  Review of Systems  Constitutional: Negative for chills and fever.  Respiratory: Negative for shortness of breath.   Cardiovascular: Negative for chest pain.  Gastrointestinal: Negative for abdominal pain, nausea and vomiting.  Genitourinary: Negative for dysuria.  Skin: Positive for rash and wound.      Objective:    BP 121/87   Pulse 93   Wt 148 lb (67.1 kg)   LMP 06/24/2018   BMI 26.22 kg/m  Physical Exam Constitutional:      General: She is not in acute distress.    Appearance: She is well-developed.  HENT:     Head: Normocephalic and atraumatic.  Eyes:     General: No scleral icterus. Neck:     Musculoskeletal: Neck supple.  Cardiovascular:     Rate and Rhythm: Normal rate.  Pulmonary:     Effort: Pulmonary effort is normal.  Chest:    Abdominal:     Palpations: Abdomen is soft.  Skin:    General: Skin is warm and dry.  Neurological:     Mental Status: She is alert and oriented to person, place, and time.         Assessment & Plan:   Problem List Items Addressed This Visit    None    Visit Diagnoses    Breast abscess    -  Primary   repeat 10d Abx, warm compress tid. If unresolved, refer to surgeon.      Total face-to-face time with patient: 15 minutes. Over 50% of encounter was spent on counseling and coordination of care. No follow-ups on file.  Donnamae Jude 07/15/2018 3:54 PM

## 2018-07-17 ENCOUNTER — Encounter: Payer: Self-pay | Admitting: *Deleted

## 2018-07-17 ENCOUNTER — Encounter: Payer: Self-pay | Admitting: Radiology

## 2018-07-22 ENCOUNTER — Ambulatory Visit: Payer: Managed Care, Other (non HMO) | Admitting: Obstetrics & Gynecology

## 2018-07-24 ENCOUNTER — Telehealth: Payer: Self-pay | Admitting: Radiology

## 2018-07-24 NOTE — Telephone Encounter (Signed)
Patient called about appointment on 07/22/18, that was cancelled via automated service. Patient states that she did not want to reschedule, she will complete the antibiotic and call back if needed.

## 2018-09-09 ENCOUNTER — Other Ambulatory Visit: Payer: Self-pay

## 2018-09-09 ENCOUNTER — Other Ambulatory Visit: Payer: Self-pay | Admitting: Obstetrics & Gynecology

## 2018-09-09 ENCOUNTER — Ambulatory Visit
Admission: RE | Admit: 2018-09-09 | Discharge: 2018-09-09 | Disposition: A | Discharge status: Home / Self Care | Payer: Managed Care, Other (non HMO) | Source: Ambulatory Visit | Attending: Obstetrics & Gynecology | Admitting: Obstetrics & Gynecology

## 2018-09-09 DIAGNOSIS — N611 Abscess of the breast and nipple: Secondary | ICD-10-CM

## 2018-09-09 DIAGNOSIS — R599 Enlarged lymph nodes, unspecified: Secondary | ICD-10-CM

## 2018-12-01 DIAGNOSIS — N7093 Salpingitis and oophoritis, unspecified: Secondary | ICD-10-CM

## 2018-12-01 HISTORY — DX: Salpingitis and oophoritis, unspecified: N70.93

## 2018-12-17 ENCOUNTER — Telehealth: Payer: Self-pay | Admitting: *Deleted

## 2018-12-17 MED ORDER — DICLOFENAC SODIUM 75 MG PO TBEC: 75.0000 mg | DELAYED_RELEASE_TABLET | Freq: Two times a day (BID) | ORAL | 0 refills | Status: DC | PRN

## 2018-12-17 NOTE — Telephone Encounter (Signed)
Erroneous encounter

## 2018-12-17 NOTE — Telephone Encounter (Deleted)
-----   Message from Donnamae Jude, MD sent at 12/17/2018  2:32 PM EST ----- Regarding: RE: pt called Trial of Diclofenac 75 mg bid, you may send in--Heather needs to schedule an HSG on Day 5 of cycle from first day of menses. She has an order for this. ----- Message ----- From: Nelta Numbers, RN Sent: 12/17/2018  10:09 AM EST To: Donnamae Jude, MD Subject: pt called                                      Hi Dr Kennon Rounds,  This patient called and stated she is concerned with the amount of cramping she is having with her cycle this month. But she also called because she said you wanted to know when she was on her cycle to perform a "dye" test. Im really confused and not sure if she just needs to see you again or if you remember her situation .   Let me know   Thanks  Altha Harm

## 2018-12-17 NOTE — Telephone Encounter (Signed)
Pt called stating she was having intense cramp with this cycle she is currently on. She also wanted to let Dr Kennon Rounds know that she has started her cycle on Sunday, due to Dr Kennon Rounds wanting to do a certain test. Dr Kennon Rounds notified and pt does need to be scheduled for HSG on day 5 of cycle and we can call in meds for the cramps. Pt informed of POC.

## 2018-12-18 ENCOUNTER — Telehealth: Payer: Self-pay | Admitting: Radiology

## 2018-12-18 NOTE — Telephone Encounter (Signed)
Informed patient of appointment change for HSG to 12/23/18 @ 2:00 at [facility]

## 2018-12-19 ENCOUNTER — Ambulatory Visit
Admission: RE | Admit: 2018-12-19 | Discharge: 2018-12-19 | Disposition: A | Discharge status: Home / Self Care | Payer: Managed Care, Other (non HMO) | Source: Ambulatory Visit | Attending: Family Medicine | Admitting: Family Medicine

## 2018-12-19 ENCOUNTER — Ambulatory Visit: Payer: Managed Care, Other (non HMO)

## 2018-12-19 ENCOUNTER — Encounter: Payer: Self-pay | Admitting: Family Medicine

## 2018-12-19 ENCOUNTER — Telehealth: Payer: Self-pay | Admitting: Obstetrics & Gynecology

## 2018-12-19 ENCOUNTER — Other Ambulatory Visit: Payer: Self-pay

## 2018-12-19 ENCOUNTER — Telehealth: Payer: Self-pay | Admitting: Obstetrics and Gynecology

## 2018-12-19 ENCOUNTER — Other Ambulatory Visit: Payer: Self-pay | Admitting: Obstetrics & Gynecology

## 2018-12-19 ENCOUNTER — Telehealth: Payer: Self-pay | Admitting: Family Medicine

## 2018-12-19 ENCOUNTER — Ambulatory Visit (INDEPENDENT_AMBULATORY_CARE_PROVIDER_SITE_OTHER): Payer: Managed Care, Other (non HMO) | Admitting: Family Medicine

## 2018-12-19 VITALS — BP 100/72 | HR 71 | Temp 98.0°F | Resp 18 | Ht 63.0 in | Wt 143.0 lb

## 2018-12-19 DIAGNOSIS — R1033 Periumbilical pain: Secondary | ICD-10-CM | POA: Diagnosis not present

## 2018-12-19 LAB — POCT URINALYSIS DIPSTICK
Bilirubin, UA: NEGATIVE
Blood, UA: NEGATIVE
Glucose, UA: NEGATIVE
Ketones, UA: POSITIVE
Leukocytes, UA: NEGATIVE
Nitrite, UA: NEGATIVE
Protein, UA: POSITIVE — AB
Spec Grav, UA: >=1.03 — AB (ref 1.010–1.025)
Urobilinogen, UA: NEGATIVE E.U./dL — AB
pH, UA: 5.5 (ref 5.0–8.0)

## 2018-12-19 LAB — CBC
HCT: 44.6 % (ref 36.0–46.0)
Hemoglobin: 14.7 g/dL (ref 12.0–15.0)
MCHC: 33 g/dL (ref 30.0–36.0)
MCV: 97.6 fl (ref 78.0–100.0)
Platelets: 274 10*3/uL (ref 150.0–400.0)
RBC: 4.56 Mil/uL (ref 3.87–5.11)
RDW: 13.4 % (ref 11.5–15.5)
WBC: 5.7 10*3/uL (ref 4.0–10.5)

## 2018-12-19 LAB — COMPREHENSIVE METABOLIC PANEL
ALT: 9 U/L (ref 0–35)
AST: 13 U/L (ref 0–37)
Albumin: 4.3 g/dL (ref 3.5–5.2)
Alkaline Phosphatase: 79 U/L (ref 39–117)
BUN: 8 mg/dL (ref 6–23)
CO2: 28 mEq/L (ref 19–32)
Calcium: 9.5 mg/dL (ref 8.4–10.5)
Chloride: 104 mEq/L (ref 96–112)
Creatinine, Ser: 0.91 mg/dL (ref 0.40–1.20)
GFR: 69.19 mL/min (ref >60.00)
Glucose, Bld: 88 mg/dL (ref 70–99)
Potassium: 4.5 mEq/L (ref 3.5–5.1)
Sodium: 140 mEq/L (ref 135–145)
Total Bilirubin: 0.3 mg/dL (ref 0.2–1.2)
Total Protein: 7.1 g/dL (ref 6.0–8.3)

## 2018-12-19 LAB — POCT URINE PREGNANCY: Preg Test, Ur: NEGATIVE

## 2018-12-19 MED ORDER — IOHEXOL 300 MG/ML  SOLN
100.0000 mL | Freq: Once | INTRAMUSCULAR | Status: AC | PRN
  Administered 2018-12-19: 100 mL via INTRAVENOUS

## 2018-12-19 NOTE — Telephone Encounter (Signed)
Spoke with provider at [facility] and they will reach out to patient to have her admitted for treatment.   Attempted to call patient and got voicemail which was full  Routing to MA to reach out to patient to let her know she should hear from Dr. Hulan Fray about getting admitted to the hospital

## 2018-12-19 NOTE — Telephone Encounter (Signed)
GYN Telephone Note D/w Dr. Hulan Fray on call GYN who will call pt to recommend admission and IV abx.  Durene Romans MD Attending Center for [employer] (Faculty Practice) 12/19/2018 Time: [phone]

## 2018-12-19 NOTE — Telephone Encounter (Signed)
Reviewed CT scan Concerning for possible Tubo-ovarian Abscess Discussed that this would likely require inpatient treatment.   Discussed option of watch and wait and [place] tomorrow vs ER now  ER precautions discussed.   Will have MA reach out to [facility]  Doctor on call to look at imaging and provide additional recs.   Pt of Dr. Kennon Rounds

## 2018-12-19 NOTE — Assessment & Plan Note (Addendum)
Pt with severe abdominal pain and nausea while on her period. Is trying to conceive. No vaginal discharge. Rebound tenderness present is concerning for possible infection. UA negative. U preg negative. Will get labs to assess for infection and stat CT to r/o appendicitis and other acute abdominal infections.

## 2018-12-19 NOTE — Telephone Encounter (Signed)
I called her and rec'd inpatient management of her right hydro/pyo salpinx.

## 2018-12-19 NOTE — Progress Notes (Signed)
Subjective:     Shannon Dyer is a 38 y.o. female presenting for Abdominal Pain (started on 12/14/2018. Abdominal cramps, lower back pain/cramps. Nausea a lot. No urinary symptoms/issues. No vomiting)     Abdominal Pain Associated symptoms include nausea. Pertinent negatives include no constipation, diarrhea, dysuria, fever, frequency, myalgias or vomiting. The pain is aggravated by certain positions. Relieved by: laying down.     #Abdominal pain - is due to have her fallopian tubes dilated on Tuesday - trying to get pregnant - started period on 12/15/2018 - Cramping abdominal pain - occasional pulling or pressure or stabbing at the belly button - feels that her lower abdomen is painful - even for her pants to press - denies bloating feeling - last sex was on Tuesday 11/17 w/o improvement - did not worsen symptoms or help symptoms - Treatment: midol w/o improvement - hx of ovarian cyst in the left ovary when she was 19 and had surgery for this - normal menstrual bleeding - mostly spotting now   Review of Systems  Constitutional: Negative for fever.  Gastrointestinal: Positive for abdominal pain and nausea. Negative for constipation, diarrhea and vomiting.  Genitourinary: Positive for vaginal bleeding. Negative for difficulty urinating, dysuria, frequency, vaginal discharge and vaginal pain.  Musculoskeletal: Positive for back pain. Negative for myalgias.     Social History   Tobacco Use  Smoking Status Current Every Day Smoker  . Packs/day: 0.50  . Years: 7.00  . Pack years: 3.50  . Types: Cigarettes  Smokeless Tobacco Never Used        Objective:    BP Readings from Last 3 Encounters:  12/19/18 100/72  07/15/18 121/87  05/22/18 133/81   Wt Readings from Last 3 Encounters:  12/19/18 143 lb (64.9 kg)  07/15/18 148 lb (67.1 kg)  05/14/18 146 lb (66.2 kg)    BP 100/72   Pulse 71   Temp 98 F (36.7 C)   Resp 18   Ht 5\' 3"  (1.6 m)   Wt 143 lb (64.9  kg)   LMP 12/15/2018   SpO2 99%   BMI 25.33 kg/m    Physical Exam Constitutional:      General: She is not in acute distress.    Appearance: She is well-developed. She is not diaphoretic.  HENT:     Right Ear: External ear normal.     Left Ear: External ear normal.     Nose: Nose normal.  Eyes:     Conjunctiva/sclera: Conjunctivae normal.  Neck:     Musculoskeletal: Neck supple.  Cardiovascular:     Rate and Rhythm: Normal rate and regular rhythm.     Heart sounds: No murmur.  Pulmonary:     Effort: Pulmonary effort is normal. No respiratory distress.     Breath sounds: Normal breath sounds. No wheezing.  Abdominal:     General: Abdomen is flat. Bowel sounds are normal. There is no distension.     Palpations: Abdomen is soft.     Tenderness: There is generalized abdominal tenderness and tenderness in the periumbilical area. There is rebound. There is no right CVA tenderness, left CVA tenderness or guarding. Negative signs include Murphy's sign and McBurney's sign.  Genitourinary:    Vagina: Bleeding present.     Cervix: No cervical motion tenderness.  Skin:    General: Skin is warm and dry.     Capillary Refill: Capillary refill takes less than 2 seconds.  Neurological:     Mental Status: She  is alert. Mental status is at baseline.  Psychiatric:        Mood and Affect: Mood normal.        Behavior: Behavior normal.    UA: neg nitrites, neg LE, +protein + glucose        Assessment & Plan:   Problem List Items Addressed This Visit      Other   Periumbilical abdominal pain - Primary    Pt with severe abdominal pain and nausea while on her period. Is trying to conceive. No vaginal discharge. Rebound tenderness present is concerning for possible infection. UA negative. U preg negative. Will get labs to assess for infection and stat CT to r/o appendicitis and other acute abdominal infections.       Relevant Orders   CT Abdomen Pelvis W Contrast   POCT urinalysis  dipstick (Completed)   CBC   Comprehensive metabolic panel   POCT urine pregnancy (Completed)       Return if symptoms worsen or fail to improve.  Lynnda Child, MD

## 2018-12-19 NOTE — Telephone Encounter (Signed)
Spoke with patient. Dr. Hulan Fray already called patient and spoke about this with her and patient is going to be headed to [facility] shortly

## 2018-12-19 NOTE — Patient Instructions (Addendum)
Abdominal pain - Urine test - no signs of infection - Blood work before you leave - CT scan to look for serious causes of your pain - Ok to continue taking medications like Midol or Ibuprofen for your pain

## 2018-12-19 NOTE — Telephone Encounter (Signed)
Called and asked to have one of the doctors to call back after reviewing CT result. Dr Einar Pheasant aware. Awaiting call back

## 2018-12-20 ENCOUNTER — Inpatient Hospital Stay (HOSPITAL_COMMUNITY)
Admission: EM | Admit: 2018-12-20 | Discharge: 2018-12-23 | DRG: 759 | Disposition: A | Discharge status: Home / Self Care | Payer: Managed Care, Other (non HMO) | Attending: Obstetrics & Gynecology | Admitting: Obstetrics & Gynecology

## 2018-12-20 ENCOUNTER — Encounter (HOSPITAL_COMMUNITY): Payer: Self-pay | Admitting: General Practice

## 2018-12-20 DIAGNOSIS — N7011 Chronic salpingitis: Secondary | ICD-10-CM

## 2018-12-20 DIAGNOSIS — Z825 Family history of asthma and other chronic lower respiratory diseases: Secondary | ICD-10-CM

## 2018-12-20 DIAGNOSIS — F410 Panic disorder [episodic paroxysmal anxiety] without agoraphobia: Secondary | ICD-10-CM | POA: Diagnosis present

## 2018-12-20 DIAGNOSIS — N760 Acute vaginitis: Secondary | ICD-10-CM | POA: Diagnosis present

## 2018-12-20 DIAGNOSIS — Z20828 Contact with and (suspected) exposure to other viral communicable diseases: Secondary | ICD-10-CM | POA: Diagnosis present

## 2018-12-20 DIAGNOSIS — B9689 Other specified bacterial agents as the cause of diseases classified elsewhere: Secondary | ICD-10-CM

## 2018-12-20 DIAGNOSIS — F17210 Nicotine dependence, cigarettes, uncomplicated: Secondary | ICD-10-CM | POA: Diagnosis present

## 2018-12-20 DIAGNOSIS — Z8249 Family history of ischemic heart disease and other diseases of the circulatory system: Secondary | ICD-10-CM

## 2018-12-20 DIAGNOSIS — N7093 Salpingitis and oophoritis, unspecified: Secondary | ICD-10-CM | POA: Diagnosis present

## 2018-12-20 LAB — COMPREHENSIVE METABOLIC PANEL
ALT: 11 U/L (ref 0–44)
AST: 18 U/L (ref 15–41)
Albumin: 3.7 g/dL (ref 3.5–5.0)
Alkaline Phosphatase: 72 U/L (ref 38–126)
Anion gap: 9 (ref 5–15)
BUN: 8 mg/dL (ref 6–20)
CO2: 25 mmol/L (ref 22–32)
Calcium: 9.3 mg/dL (ref 8.9–10.3)
Chloride: 106 mmol/L (ref 98–111)
Creatinine, Ser: 1.15 mg/dL — ABNORMAL HIGH (ref 0.44–1.00)
GFR calc Af Amer: >60 mL/min (ref >60)
GFR calc non Af Amer: >60 mL/min (ref >60)
Glucose, Bld: 110 mg/dL — ABNORMAL HIGH (ref 70–99)
Potassium: 4.8 mmol/L (ref 3.5–5.1)
Sodium: 140 mmol/L (ref 135–145)
Total Bilirubin: 0.6 mg/dL (ref 0.3–1.2)
Total Protein: 6.8 g/dL (ref 6.5–8.1)

## 2018-12-20 LAB — I-STAT BETA HCG BLOOD, ED (MC, WL, AP ONLY): I-stat hCG, quantitative: <5 m[IU]/mL (ref <5)

## 2018-12-20 LAB — URINALYSIS, ROUTINE W REFLEX MICROSCOPIC
Bilirubin Urine: NEGATIVE
Glucose, UA: NEGATIVE mg/dL
Hgb urine dipstick: NEGATIVE
Ketones, ur: NEGATIVE mg/dL
Leukocytes,Ua: NEGATIVE
Nitrite: NEGATIVE
Protein, ur: NEGATIVE mg/dL
Specific Gravity, Urine: 1.021 (ref 1.005–1.030)
pH: 6 (ref 5.0–8.0)

## 2018-12-20 LAB — CBC WITH DIFFERENTIAL/PLATELET
Abs Immature Granulocytes: 0.04 10*3/uL (ref 0.00–0.07)
Basophils Absolute: 0 10*3/uL (ref 0.0–0.1)
Basophils Relative: 0 %
Eosinophils Absolute: 0.1 10*3/uL (ref 0.0–0.5)
Eosinophils Relative: 1 %
HCT: 44.7 % (ref 36.0–46.0)
Hemoglobin: 15 g/dL (ref 12.0–15.0)
Immature Granulocytes: 1 %
Lymphocytes Relative: 22 %
Lymphs Abs: 1.7 10*3/uL (ref 0.7–4.0)
MCH: 32.3 pg (ref 26.0–34.0)
MCHC: 33.6 g/dL (ref 30.0–36.0)
MCV: 96.3 fL (ref 80.0–100.0)
Monocytes Absolute: 0.5 10*3/uL (ref 0.1–1.0)
Monocytes Relative: 6 %
Neutro Abs: 5.6 10*3/uL (ref 1.7–7.7)
Neutrophils Relative %: 70 %
Platelets: 283 10*3/uL (ref 150–400)
RBC: 4.64 MIL/uL (ref 3.87–5.11)
RDW: 13 % (ref 11.5–15.5)
WBC: 8 10*3/uL (ref 4.0–10.5)
nRBC: 0 % (ref 0.0–0.2)

## 2018-12-20 LAB — WET PREP, GENITAL
Sperm: NONE SEEN
Trich, Wet Prep: NONE SEEN
Yeast Wet Prep HPF POC: NONE SEEN

## 2018-12-20 LAB — LACTIC ACID, PLASMA: Lactic Acid, Venous: 1.9 mmol/L (ref 0.5–1.9)

## 2018-12-20 LAB — SARS CORONAVIRUS 2 (TAT 6-24 HRS): SARS Coronavirus 2: NEGATIVE

## 2018-12-20 MED ORDER — ONDANSETRON HCL 4 MG/2ML IJ SOLN
4.0000 mg | Freq: Four times a day (QID) | INTRAMUSCULAR | Status: DC | PRN
  Administered 2018-12-21: 4 mg via INTRAVENOUS
  Filled 2018-12-20: qty 2

## 2018-12-20 MED ORDER — ALBUTEROL SULFATE HFA 108 (90 BASE) MCG/ACT IN AERS
2.0000 | INHALATION_SPRAY | RESPIRATORY_TRACT | Status: DC | PRN
  Filled 2018-12-20: qty 6.7

## 2018-12-20 MED ORDER — SODIUM CHLORIDE 0.9 % IV SOLN
2.0000 g | Freq: Two times a day (BID) | INTRAVENOUS | Status: DC
  Administered 2018-12-20 – 2018-12-23 (×7): 2 g via INTRAVENOUS
  Filled 2018-12-20 (×9): qty 2

## 2018-12-20 MED ORDER — SODIUM CHLORIDE 0.9 % IV SOLN: 1.0000 g | Freq: Two times a day (BID) | INTRAVENOUS | Status: DC

## 2018-12-20 MED ORDER — SODIUM CHLORIDE 0.9% FLUSH
3.0000 mL | Freq: Once | INTRAVENOUS | Status: AC
  Administered 2018-12-20: 3 mL via INTRAVENOUS

## 2018-12-20 MED ORDER — OXYCODONE-ACETAMINOPHEN 5-325 MG PO TABS
1.0000 | ORAL_TABLET | ORAL | Status: DC | PRN
  Administered 2018-12-20 – 2018-12-23 (×13): 2 via ORAL
  Filled 2018-12-20 (×13): qty 2

## 2018-12-20 MED ORDER — IBUPROFEN 600 MG PO TABS
600.0000 mg | ORAL_TABLET | Freq: Four times a day (QID) | ORAL | Status: DC | PRN
  Administered 2018-12-22: 600 mg via ORAL
  Filled 2018-12-20 (×2): qty 1

## 2018-12-20 MED ORDER — MAGNESIUM HYDROXIDE 400 MG/5ML PO SUSP: 30.0000 mL | Freq: Every day | ORAL | Status: DC | PRN

## 2018-12-20 MED ORDER — METRONIDAZOLE 500 MG PO TABS
500.0000 mg | ORAL_TABLET | Freq: Two times a day (BID) | ORAL | Status: DC
  Administered 2018-12-20 – 2018-12-23 (×7): 500 mg via ORAL
  Filled 2018-12-20 (×7): qty 1

## 2018-12-20 MED ORDER — DOCUSATE SODIUM 100 MG PO CAPS
100.0000 mg | ORAL_CAPSULE | Freq: Two times a day (BID) | ORAL | Status: DC
  Administered 2018-12-20 – 2018-12-23 (×6): 100 mg via ORAL
  Filled 2018-12-20 (×6): qty 1

## 2018-12-20 MED ORDER — ONDANSETRON HCL 4 MG PO TABS: 4.0000 mg | ORAL_TABLET | Freq: Four times a day (QID) | ORAL | Status: DC | PRN

## 2018-12-20 MED ORDER — MAGNESIUM CITRATE PO SOLN: 1.0000 | Freq: Once | ORAL | Status: DC | PRN

## 2018-12-20 MED ORDER — ZOLPIDEM TARTRATE 5 MG PO TABS: 5.0000 mg | ORAL_TABLET | Freq: Every evening | ORAL | Status: DC | PRN

## 2018-12-20 MED ORDER — KETOROLAC TROMETHAMINE 30 MG/ML IJ SOLN: 30.0000 mg | Freq: Four times a day (QID) | INTRAMUSCULAR | Status: DC

## 2018-12-20 MED ORDER — BISACODYL 5 MG PO TBEC: 5.0000 mg | DELAYED_RELEASE_TABLET | Freq: Every day | ORAL | Status: DC | PRN

## 2018-12-20 MED ORDER — PROMETHAZINE HCL 25 MG/ML IJ SOLN: 25.0000 mg | Freq: Four times a day (QID) | INTRAMUSCULAR | Status: DC | PRN

## 2018-12-20 MED ORDER — KETOROLAC TROMETHAMINE 30 MG/ML IJ SOLN
30.0000 mg | Freq: Four times a day (QID) | INTRAMUSCULAR | Status: AC
  Administered 2018-12-20 – 2018-12-21 (×4): 30 mg via INTRAVENOUS
  Filled 2018-12-20 (×4): qty 1

## 2018-12-20 MED ORDER — DOXYCYCLINE HYCLATE 100 MG PO TABS
100.0000 mg | ORAL_TABLET | Freq: Two times a day (BID) | ORAL | Status: DC
  Administered 2018-12-20 – 2018-12-23 (×7): 100 mg via ORAL
  Filled 2018-12-20 (×7): qty 1

## 2018-12-20 MED ORDER — SODIUM CHLORIDE 0.9 % IV SOLN
INTRAVENOUS | Status: DC
  Administered 2018-12-20: 12:00:00 via INTRAVENOUS

## 2018-12-20 MED ORDER — ALUM & MAG HYDROXIDE-SIMETH 200-200-20 MG/5ML PO SUSP: 30.0000 mL | ORAL | Status: DC | PRN

## 2018-12-20 MED ORDER — KETOROLAC TROMETHAMINE 30 MG/ML IJ SOLN: 30.0000 mg | Freq: Four times a day (QID) | INTRAMUSCULAR | Status: AC

## 2018-12-20 MED ORDER — SODIUM CHLORIDE 0.9 % IV SOLN: 100.0000 mg | Freq: Two times a day (BID) | INTRAVENOUS | Status: DC

## 2018-12-20 MED ORDER — HYDROMORPHONE HCL 1 MG/ML IJ SOLN: 0.5000 mg | INTRAMUSCULAR | Status: DC | PRN

## 2018-12-20 NOTE — ED Triage Notes (Signed)
Pt arrives via POV from home with recommendation to be admitted for possible tubal-ovarian abscess, IV antibiotics and admission. VSS.

## 2018-12-20 NOTE — ED Provider Notes (Signed)
[facility] EMERGENCY DEPARTMENT Provider Note   CSN: [id number] Arrival date & time: 12/20/18  1010     History   Chief Complaint Chief Complaint  Patient presents with   Abdominal Pain    HPI Shannon Dyer is a 38 y.o. female.     The history is provided by the patient. No language interpreter was used.  Abdominal Pain Pain location:  Generalized Pain quality: aching   Pain radiates to:  RLQ Pain severity:  Moderate Onset quality:  Gradual Timing:  Constant Progression:  Worsening Chronicity:  New Relieved by:  Nothing Ineffective treatments:  None tried Associated symptoms: no fever, no vaginal bleeding and no vaginal discharge   Risk factors: has not had multiple surgeries   Pt saw her Physician yesterday and had an outpatient ct scan.  Pt was sent here for admission for hydrosalpinx.   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   Dyspepsia 02/14/2016   Myalgia 11/21/2012   Encounter for routine gynecological examination 05/22/2012   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 problem 05/22/2012   BACK PAIN 02/16/2010   ANXIETY DEPRESSION 06/30/2008   GANGLION CYST, WRIST, LEFT 07/25/2007    Past Surgical History:  Procedure Laterality Date   cystic ovarian mass  01/2005   ELBOW LIGAMENT RECONSTRUCTION       OB History    Gravida  0   Para      Term      Preterm      AB      Living        SAB      TAB      Ectopic      Multiple      Live Births               Home Medications    Prior to Admission medications     Medication Sig Start Date End Date Taking? Authorizing Provider  Albuterol Sulfate (PROVENTIL HFA IN) Inhale 2 puffs into the lungs daily as needed (shortness of breath).     [provider]    Family History Family History  Problem Relation Age of Onset   Hypertension Father 37       died of unknown causes-autopsy   COPD Father    Asthma Sister    Breast cancer Neg Hx     Social History Social History   Tobacco Use   Smoking status: Current Every Day Smoker    Packs/day: 0.50    Years: 7.00    Pack years: 3.50    Types: Cigarettes   Smokeless tobacco: Never Used  Substance Use Topics   Alcohol use: Yes    Alcohol/week: 0.0 standard drinks    Comment: occasionally   Drug use: No     Allergies   Patient has no known allergies.   Review of Systems Review of Systems  Constitutional: Negative for fever.  Gastrointestinal: Positive for abdominal pain.  Genitourinary: Negative for vaginal bleeding and vaginal discharge.  All other systems reviewed and are negative.  Physical Exam Updated Vital Signs BP 111/89 (BP Location: Left Arm)    Pulse 94    Temp 98.2 F (36.8 C) (Oral)    Resp 16    LMP 12/15/2018    SpO2 99%   Physical Exam Vitals signs and nursing note reviewed.  Constitutional:      Appearance: She is well-developed.  HENT:     Head: Normocephalic.  Neck:     Musculoskeletal: Normal range of motion.  Cardiovascular:     Rate and Rhythm: Normal rate.  Pulmonary:     Effort: Pulmonary effort is normal.  Abdominal:     General: Bowel sounds are normal. There is no distension.     Tenderness: There is abdominal tenderness in the right lower quadrant.  Musculoskeletal: Normal range of motion.  Skin:    General: Skin is warm.  Neurological:     Mental Status: She is alert and oriented to person, place, and time.      ED Treatments / Results  Labs (all labs ordered are listed, but only abnormal results are displayed) Labs  Reviewed  SARS CORONAVIRUS 2 (TAT 6-24 HRS)  LACTIC ACID, PLASMA  CBC WITH DIFFERENTIAL/PLATELET  LACTIC ACID, PLASMA  COMPREHENSIVE METABOLIC PANEL  URINALYSIS, ROUTINE W REFLEX MICROSCOPIC  I-STAT BETA HCG BLOOD, ED ([facility], [facility], [facility] ONLY)    EKG None  Radiology Ct Abdomen Pelvis W Contrast  Result Date: 12/19/2018 CLINICAL DATA:  Lower abdominal and pelvic pain since menstrual period began on Sunday. EXAM: CT ABDOMEN AND PELVIS WITH CONTRAST TECHNIQUE: Multidetector CT imaging of the abdomen and pelvis was performed using the standard protocol following bolus administration of intravenous contrast. CONTRAST:  177mL OMNIPAQUE IOHEXOL 300 MG/ML  SOLN COMPARISON:  CT abdomen pelvis dated February 14, 2016. FINDINGS: Lower chest: No acute abnormality. Hepatobiliary: No focal liver abnormality is seen. No gallstones, gallbladder wall thickening, or biliary dilatation. Pancreas: Unremarkable. No pancreatic ductal dilatation or surrounding inflammatory changes. Spleen: Normal in size without focal abnormality. Adrenals/Urinary Tract: Adrenal glands are unremarkable. Kidneys are normal, without renal calculi, focal lesion, or hydronephrosis. Bladder is unremarkable. Stomach/Bowel: Stomach is within normal limits. Appendix appears normal. No evidence of bowel wall thickening, distention, or inflammatory changes. Vascular/Lymphatic: No significant vascular findings are present. No enlarged abdominal or pelvic lymph nodes. Reproductive: The uterus and ovaries are unremarkable. Elongated tubular structure in the right adnexa with thin septation, mild wall thickening and slight surrounding inflammatory change. This measures approximately 4.9 x 2.1 cm (AP x TR). Other: No abdominal wall hernia or abnormality. No abdominopelvic ascites. No pneumoperitoneum. Musculoskeletal: No acute or significant osseous findings. IMPRESSION: 1. Elongated 4.9 x 2.1 cm tubular structure in the right adnexa suspicious for hydrosalpinx.  Pyosalpinx is not excluded given slight wall thickening and surrounding inflammatory change. Correlate with clinical and laboratory signs of infection and consider pelvic ultrasound for further evaluation. 2. Normal appendix. These results will be called to the ordering clinician or representative by the Radiology Department at the imaging location. Electronically Signed   By: Titus Dubin M.D.   On: 12/19/2018 15:10    Procedures Procedures (including critical care time)  Medications Ordered in ED Medications  sodium chloride flush (NS) 0.9 % injection 3 mL (3 mLs Intravenous Given 12/20/18 1127)     Initial Impression / Assessment and Plan / ED Course  I have reviewed the triage vital signs and the nursing notes.  Pertinent labs & imaging results that were available during my care of the patient were  reviewed by me and considered in my medical decision making (see chart for details).        MDM  I spoke to GYN on call for Teaching service.  She will admit for treatment   Final Clinical Impressions(s) / ED Diagnoses   Final diagnoses:  Hydrosalpinx    ED Discharge Orders    None       Osie CheeksSofia, Novie Maggio K, PA-C 12/20/18 1209    Alvira MondaySchlossman, Erin, MD 12/21/18 2233

## 2018-12-20 NOTE — ED Notes (Signed)
ED TO INPATIENT HANDOFF REPORT  ED Nurse Name and Phone #: [phone]  S Name/Age/Gender Shannon Dyer 38 y.o. female Room/Bed: 032C/032C  Code Status   Code Status: Full Code  Home/SNF/Other Home Patient oriented to: self, place, time and situation Is this baseline? Yes   Triage Complete: Triage complete  Chief Complaint pelvic pain/sent by dr  Triage Note Pt arrives via POV from home with recommendation to be admitted for possible tubal-ovarian abscess, IV antibiotics and admission. VSS.    Allergies No Known Allergies  Level of Care/Admitting Diagnosis ED Disposition    ED Disposition Condition Comment   Admit  Hospital Area: [facility] [[id number]]  Level of Care: Med-Surg [16]  Covid Evaluation: Asymptomatic Screening Protocol (No Symptoms)  Diagnosis: TOA (tubo-ovarian abscess) [454098]  Admitting Physician: Tereso Newcomer [[id number]]  Attending Physician: Jaynie Collins A [[id number]]  Estimated length of stay: inpatient only procedure  Certification:: I certify this patient is being admitted for an inpatient-only procedure  PT Class (Do Not Modify): Inpatient [101]  PT Acc Code (Do Not Modify): Private [1]       B Medical/Surgery History Past Medical History:  Diagnosis Date  . Migraines   . Panic attacks   . Tobacco abuse    Past Surgical History:  Procedure Laterality Date  . cystic ovarian mass  01/2005  . ELBOW LIGAMENT RECONSTRUCTION       A IV Location/Drains/Wounds Patient Lines/Drains/Airways Status   Active Line/Drains/Airways    Name:   Placement date:   Placement time:   Site:   Days:   Peripheral IV 12/20/18 Right;Lateral Wrist   12/20/18    1223    Wrist   less than 1          Intake/Output Last 24 hours No intake or output data in the 24 hours ending 12/20/18 1255  Labs/Imaging Results for orders placed or performed during the hospital encounter of 12/20/18 (from the past 48 hour(s))  Lactic acid, plasma      Status: None   Collection Time: 12/20/18 10:49 AM  Result Value Ref Range   Lactic Acid, Venous 1.9 0.5 - 1.9 mmol/L    Comment: Performed at [facility], [address], [place], [place] [address]  Comprehensive metabolic panel     Status: Abnormal   Collection Time: 12/20/18 10:50 AM  Result Value Ref Range   Sodium 140 135 - 145 mmol/L   Potassium 4.8 3.5 - 5.1 mmol/L   Chloride 106 98 - 111 mmol/L   CO2 25 22 - 32 mmol/L   Glucose, Bld 110 (H) 70 - 99 mg/dL   BUN 8 6 - 20 mg/dL   Creatinine, Ser 7.82 (H) 0.44 - 1.00 mg/dL   Calcium 9.3 8.9 - 95.6 mg/dL   Total Protein 6.8 6.5 - 8.1 g/dL   Albumin 3.7 3.5 - 5.0 g/dL   AST 18 15 - 41 U/L   ALT 11 0 - 44 U/L   Alkaline Phosphatase 72 38 - 126 U/L   Total Bilirubin 0.6 0.3 - 1.2 mg/dL   GFR calc non Af Amer >60 >60 mL/min   GFR calc Af Amer >60 >60 mL/min   Anion gap 9 5 - 15    Comment: Performed at [facility], [address], [place], [place] [address]  CBC with Differential     Status: None   Collection Time: 12/20/18 10:50 AM  Result Value Ref Range   WBC 8.0  4.0 - 10.5 K/uL   RBC 4.64 3.87 - 5.11 MIL/uL   Hemoglobin 15.0 12.0 - 15.0 g/dL   HCT 16.144.7 09.636.0 - 04.546.0 %   MCV 96.3 80.0 - 100.0 fL   MCH 32.3 26.0 - 34.0 pg   MCHC 33.6 30.0 - 36.0 g/dL   RDW 40.913.0 81.111.5 - 91.415.5 %   Platelets 283 150 - 400 K/uL   nRBC 0.0 0.0 - 0.2 %   Neutrophils Relative % 70 %   Neutro Abs 5.6 1.7 - 7.7 K/uL   Lymphocytes Relative 22 %   Lymphs Abs 1.7 0.7 - 4.0 K/uL   Monocytes Relative 6 %   Monocytes Absolute 0.5 0.1 - 1.0 K/uL   Eosinophils Relative 1 %   Eosinophils Absolute 0.1 0.0 - 0.5 K/uL   Basophils Relative 0 %   Basophils Absolute 0.0 0.0 - 0.1 K/uL   Immature Granulocytes 1 %   Abs Immature Granulocytes 0.04 0.00 - 0.07 K/uL    Comment: Performed at [facility], [address] [place], [place] [address]  I-Stat beta hCG blood, ED     Status: None   Collection Time: 12/20/18 10:51 AM  Result Value  Ref Range   I-stat hCG, quantitative <5.0 <5 mIU/mL   Comment 3            Comment:   GEST. AGE      CONC.  (mIU/mL)   <=1 WEEK        5 - 50     2 WEEKS       50 - 500     3 WEEKS       100 - 10,000     4 WEEKS     1,000 - 30,000        FEMALE AND NON-PREGNANT FEMALE:     LESS THAN 5 mIU/mL   Urinalysis, Routine w reflex microscopic     Status: Abnormal   Collection Time: 12/20/18 11:20 AM  Result Value Ref Range   Color, Urine YELLOW YELLOW   APPearance HAZY (A) CLEAR   Specific Gravity, Urine 1.021 1.005 - 1.030   pH 6.0 5.0 - 8.0   Glucose, UA NEGATIVE NEGATIVE mg/dL   Hgb urine dipstick NEGATIVE NEGATIVE   Bilirubin Urine NEGATIVE NEGATIVE   Ketones, ur NEGATIVE NEGATIVE mg/dL   Protein, ur NEGATIVE NEGATIVE mg/dL   Nitrite NEGATIVE NEGATIVE   Leukocytes,Ua NEGATIVE NEGATIVE    Comment: Performed at [facility], [address] [place], [place] [address]   Ct Abdomen Pelvis W Contrast  Result Date: 12/19/2018 CLINICAL DATA:  Lower abdominal and pelvic pain since menstrual period began on Sunday. EXAM: CT ABDOMEN AND PELVIS WITH CONTRAST TECHNIQUE: Multidetector CT imaging of the abdomen and pelvis was performed using the standard protocol following bolus administration of intravenous contrast. CONTRAST:  100mL OMNIPAQUE IOHEXOL 300 MG/ML  SOLN COMPARISON:  CT abdomen pelvis dated February 14, 2016. FINDINGS: Lower chest: No acute abnormality. Hepatobiliary: No focal liver abnormality is seen. No gallstones, gallbladder wall thickening, or biliary dilatation. Pancreas: Unremarkable. No pancreatic ductal dilatation or surrounding inflammatory changes. Spleen: Normal in size without focal abnormality. Adrenals/Urinary Tract: Adrenal glands are unremarkable. Kidneys are normal, without renal calculi, focal lesion, or hydronephrosis. Bladder is unremarkable. Stomach/Bowel: Stomach is within normal limits. Appendix appears normal. No evidence of bowel wall thickening, distention, or  inflammatory changes. Vascular/Lymphatic: No significant vascular findings are present. No enlarged abdominal or pelvic lymph nodes. Reproductive: The  uterus and ovaries are unremarkable. Elongated tubular structure in the right adnexa with thin septation, mild wall thickening and slight surrounding inflammatory change. This measures approximately 4.9 x 2.1 cm (AP x TR). Other: No abdominal wall hernia or abnormality. No abdominopelvic ascites. No pneumoperitoneum. Musculoskeletal: No acute or significant osseous findings. IMPRESSION: 1. Elongated 4.9 x 2.1 cm tubular structure in the right adnexa suspicious for hydrosalpinx. Pyosalpinx is not excluded given slight wall thickening and surrounding inflammatory change. Correlate with clinical and laboratory signs of infection and consider pelvic ultrasound for further evaluation. 2. Normal appendix. These results will be called to the ordering clinician or representative by the Radiology Department at the imaging location. Electronically Signed   By: Titus Dubin M.D.   On: 12/19/2018 15:10    Pending Labs Unresulted Labs (From admission, onward)    Start     Ordered   12/21/18 0500  CBC WITH DIFFERENTIAL  Daily,   R     12/20/18 1153   12/21/18 4401  Basic metabolic panel  Daily,   R     12/20/18 1153   12/20/18 1158  Wet prep, genital  ONCE - STAT,   STAT     12/20/18 1157   12/20/18 1136  SARS CORONAVIRUS 2 (TAT 6-24 HRS) Nasopharyngeal Nasopharyngeal Swab  (Asymptomatic/Tier 3)  Once,   STAT    Question Answer Comment  Is this test for diagnosis or screening Screening   Symptomatic for COVID-19 as defined by CDC No   Hospitalized for COVID-19 No   Admitted to ICU for COVID-19 No   Previously tested for COVID-19 No   Resident in a congregate (group) care setting No   Employed in healthcare setting No   Pregnant No      12/20/18 1136          Vitals/Pain Today's Vitals   12/20/18 1013 12/20/18 1034  BP: 111/89   Pulse: 94   Resp:  16   Temp: 98.2 F (36.8 C)   TempSrc: Oral   SpO2: 99%   PainSc:  10-Worst pain ever    Isolation Precautions No active isolations  Medications Medications  albuterol (VENTOLIN HFA) 108 (90 Base) MCG/ACT inhaler 2 puff (has no administration in time range)  ibuprofen (ADVIL) tablet 600 mg (has no administration in time range)  oxyCODONE-acetaminophen (PERCOCET/ROXICET) 5-325 MG per tablet 1-2 tablet (has no administration in time range)  zolpidem (AMBIEN) tablet 5 mg (has no administration in time range)  docusate sodium (COLACE) capsule 100 mg (100 mg Oral Not Given 12/20/18 1249)  magnesium hydroxide (MILK OF MAGNESIA) suspension 30 mL (has no administration in time range)  bisacodyl (DULCOLAX) EC tablet 5 mg (has no administration in time range)  magnesium citrate solution 1 Bottle (has no administration in time range)  alum & mag hydroxide-simeth (MAALOX/MYLANTA) 200-200-20 MG/5ML suspension 30 mL (has no administration in time range)  ondansetron (ZOFRAN) tablet 4 mg (has no administration in time range)    Or  ondansetron (ZOFRAN) injection 4 mg (has no administration in time range)  0.9 %  sodium chloride infusion ( Intravenous New Bag/Given 12/20/18 1224)  metroNIDAZOLE (FLAGYL) tablet 500 mg (500 mg Oral Given 12/20/18 1215)  HYDROmorphone (DILAUDID) injection 0.5-1 mg (has no administration in time range)  ketorolac (TORADOL) 30 MG/ML injection 30 mg (30 mg Intravenous Given 12/20/18 1228)    Or  ketorolac (TORADOL) 30 MG/ML injection 30 mg ( Intramuscular See Alternative 12/20/18 1228)  cefoTEtan (CEFOTAN) 2 g in sodium chloride  0.9 % 100 mL IVPB (2 g Intravenous New Bag/Given 12/20/18 1248)  doxycycline (VIBRA-TABS) tablet 100 mg (100 mg Oral Given 12/20/18 1215)  sodium chloride flush (NS) 0.9 % injection 3 mL (3 mLs Intravenous Given 12/20/18 1127)    Mobility walks     Focused Assessments    R Recommendations: See Admitting Provider Note  Report given  to:   Additional Notes:

## 2018-12-20 NOTE — H&P (Signed)
Obstetrics and Gynecology Attending History and Physical  Shannon Dyer is a 38 y.o. G0P0 who presented to ED today for recommended admission after being diagnosed with right tuboovarian abscess (TOA) yesterday on CT scan.  Was seen in her PCP's office yesterday and reported pain since 12/14/18, that was worsening in intensity.  She had some nausea, no vomiting.  Aggravated by certain positions, alleviated by lying down. CT scan was ordered which was done at [facility], this showed normal appendix but possible R pyosalpinx, surrounding inflammation concerning for TOA.  Dr. Vergie Living, one of my partners at [facility], was notified who recommended admission of the patient. Patient did not come in until today, she was "afraid to be admitted".  Today, pain is still worsening, rates this a 9-10/10 on a pain scale, associated with nausea, back pain. Denies any abnormal vaginal discharge, fevers, chills, sweats, dysuria, vomiting, other GI or GU symptoms or other general symptoms.  Past Medical History:  Diagnosis Date   Migraines    Panic attacks    Tobacco abuse    Past Surgical History:  Procedure Laterality Date   cystic ovarian mass  01/2005   ELBOW LIGAMENT RECONSTRUCTION     OB History  Gravida Para Term Preterm AB Living  0            SAB TAB Ectopic Multiple Live Births             Patient denies any other pertinent gynecologic issues.   No current facility-administered medications on file prior to encounter.    Current Outpatient Medications on File Prior to Encounter  Medication Sig Dispense Refill   Albuterol Sulfate (PROVENTIL HFA IN) Inhale 2 puffs into the lungs daily as needed (shortness of breath).      No Known Allergies  Social History:   reports that she has been smoking cigarettes. She has a 3.50 pack-year smoking history. She has never used smokeless tobacco. She reports current alcohol use. She reports that she does not use drugs. Family History  Problem  Relation Age of Onset   Hypertension Father 92       died of unknown causes-autopsy   COPD Father    Asthma Sister    Breast cancer Neg Hx     Review of Systems: Pertinent items noted in HPI and remainder of comprehensive ROS otherwise negative.  PHYSICAL EXAM: Blood pressure 111/89, pulse 94, temperature 98.2 F (36.8 C), temperature source Oral, resp. rate 16, last menstrual period 12/15/2018, SpO2 99 %. CONSTITUTIONAL: Well-developed, well-nourished female in no acute distress.  HENT:  Normocephalic, atraumatic, External right and left ear normal. Oropharynx is clear and moist EYES: Conjunctivae and EOM are normal. Pupils are equal, round, and reactive to light. No scleral icterus.  NECK: Normal range of motion, supple, no masses SKIN: Skin is warm and dry. No rash noted. Not diaphoretic. No erythema. No pallor. NEUROLOGIC: Alert and oriented to person, place, and time. Normal reflexes, muscle tone coordination. No cranial nerve deficit noted. PSYCHIATRIC: Normal mood and affect. Normal behavior. Normal judgment and thought content. CARDIOVASCULAR: Normal heart rate noted, regular rhythm RESPIRATORY: Effort and breath sounds normal, no problems with respiration noted ABDOMEN: Soft, nondistended, moderate lower abdominal tenderness to palpation, R>L, no rebound or guarding. PELVIC: Normal appearing external genitalia; normal appearing distal vaginal mucosa and scant white discharge.  On bimanual, ormal uterine size, no other palpable masses, moderate cervical motion, uterine and adnexal tenderness. No bleeding. MUSCULOSKELETAL: Normal range of motion. No  tenderness.  No cyanosis, clubbing, or edema.  2+ distal pulses.  Labs: Results for orders placed or performed during the hospital encounter of 12/20/18 (from the past 336 hour(s))  Lactic acid, plasma   Collection Time: 12/20/18 10:49 AM  Result Value Ref Range   Lactic Acid, Venous 1.9 0.5 - 1.9 mmol/L  Comprehensive metabolic  panel   Collection Time: 12/20/18 10:50 AM  Result Value Ref Range   Sodium 140 135 - 145 mmol/L   Potassium 4.8 3.5 - 5.1 mmol/L   Chloride 106 98 - 111 mmol/L   CO2 25 22 - 32 mmol/L   Glucose, Bld 110 (H) 70 - 99 mg/dL   BUN 8 6 - 20 mg/dL   Creatinine, Ser 1.15 (H) 0.44 - 1.00 mg/dL   Calcium 9.3 8.9 - 10.3 mg/dL   Total Protein 6.8 6.5 - 8.1 g/dL   Albumin 3.7 3.5 - 5.0 g/dL   AST 18 15 - 41 U/L   ALT 11 0 - 44 U/L   Alkaline Phosphatase 72 38 - 126 U/L   Total Bilirubin 0.6 0.3 - 1.2 mg/dL   GFR calc non Af Amer >60 >60 mL/min   GFR calc Af Amer >60 >60 mL/min   Anion gap 9 5 - 15  CBC with Differential   Collection Time: 12/20/18 10:50 AM  Result Value Ref Range   WBC 8.0 4.0 - 10.5 K/uL   RBC 4.64 3.87 - 5.11 MIL/uL   Hemoglobin 15.0 12.0 - 15.0 g/dL   HCT 44.7 36.0 - 46.0 %   MCV 96.3 80.0 - 100.0 fL   MCH 32.3 26.0 - 34.0 pg   MCHC 33.6 30.0 - 36.0 g/dL   RDW 13.0 11.5 - 15.5 %   Platelets 283 150 - 400 K/uL   nRBC 0.0 0.0 - 0.2 %   Neutrophils Relative % 70 %   Neutro Abs 5.6 1.7 - 7.7 K/uL   Lymphocytes Relative 22 %   Lymphs Abs 1.7 0.7 - 4.0 K/uL   Monocytes Relative 6 %   Monocytes Absolute 0.5 0.1 - 1.0 K/uL   Eosinophils Relative 1 %   Eosinophils Absolute 0.1 0.0 - 0.5 K/uL   Basophils Relative 0 %   Basophils Absolute 0.0 0.0 - 0.1 K/uL   Immature Granulocytes 1 %   Abs Immature Granulocytes 0.04 0.00 - 0.07 K/uL  I-Stat beta hCG blood, ED   Collection Time: 12/20/18 10:51 AM  Result Value Ref Range   I-stat hCG, quantitative <5.0 <5 mIU/mL   Comment 3          Urinalysis, Routine w reflex microscopic   Collection Time: 12/20/18 11:20 AM  Result Value Ref Range   Color, Urine YELLOW YELLOW   APPearance HAZY (A) CLEAR   Specific Gravity, Urine 1.021 1.005 - 1.030   pH 6.0 5.0 - 8.0   Glucose, UA NEGATIVE NEGATIVE mg/dL   Hgb urine dipstick NEGATIVE NEGATIVE   Bilirubin Urine NEGATIVE NEGATIVE   Ketones, ur NEGATIVE NEGATIVE mg/dL    Protein, ur NEGATIVE NEGATIVE mg/dL   Nitrite NEGATIVE NEGATIVE   Leukocytes,Ua NEGATIVE NEGATIVE  Wet prep, genital   Collection Time: 12/20/18 12:40 PM   Specimen: Genital  Result Value Ref Range   Yeast Wet Prep HPF POC NONE SEEN NONE SEEN   Trich, Wet Prep NONE SEEN NONE SEEN   Clue Cells Wet Prep HPF POC PRESENT (A) NONE SEEN   WBC, Wet Prep HPF POC MANY (A) NONE SEEN  Sperm NONE SEEN   Results for orders placed or performed in visit on 12/19/18 (from the past 336 hour(s))  POCT urinalysis dipstick   Collection Time: 12/19/18 11:45 AM  Result Value Ref Range   Color, UA yellowish/brown    Clarity, UA clear    Glucose, UA Negative Negative   Bilirubin, UA negtaive    Ketones, UA positive    Spec Grav, UA >=1.030 (A) 1.010 - 1.025   Blood, UA negative    pH, UA 5.5 5.0 - 8.0   Protein, UA Positive (A) Negative   Urobilinogen, UA negative (A) 0.2 or 1.0 E.U./dL   Nitrite, UA negative    Leukocytes, UA Negative Negative   Appearance     Odor    POCT urine pregnancy   Collection Time: 12/19/18 12:16 PM  Result Value Ref Range   Preg Test, Ur Negative Negative  CBC   Collection Time: 12/19/18 12:27 PM  Result Value Ref Range   WBC 5.7 4.0 - 10.5 K/uL   RBC 4.56 3.87 - 5.11 Mil/uL   Platelets 274.0 150.0 - 400.0 K/uL   Hemoglobin 14.7 12.0 - 15.0 g/dL   HCT 79.0 24.0 - 97.3 %   MCV 97.6 78.0 - 100.0 fl   MCHC 33.0 30.0 - 36.0 g/dL   RDW 53.2 99.2 - 42.6 %  Comprehensive metabolic panel   Collection Time: 12/19/18 12:27 PM  Result Value Ref Range   Sodium 140 135 - 145 mEq/L   Potassium 4.5 3.5 - 5.1 mEq/L   Chloride 104 96 - 112 mEq/L   CO2 28 19 - 32 mEq/L   Glucose, Bld 88 70 - 99 mg/dL   BUN 8 6 - 23 mg/dL   Creatinine, Ser 8.34 0.40 - 1.20 mg/dL   Total Bilirubin 0.3 0.2 - 1.2 mg/dL   Alkaline Phosphatase 79 39 - 117 U/L   AST 13 0 - 37 U/L   ALT 9 0 - 35 U/L   Total Protein 7.1 6.0 - 8.3 g/dL   Albumin 4.3 3.5 - 5.2 g/dL   GFR 19.62 >22.97 mL/min    Calcium 9.5 8.4 - 10.5 mg/dL    Imaging Studies: Ct Abdomen Pelvis W Contrast  Result Date: 12/19/2018 CLINICAL DATA:  Lower abdominal and pelvic pain since menstrual period began on Sunday. EXAM: CT ABDOMEN AND PELVIS WITH CONTRAST TECHNIQUE: Multidetector CT imaging of the abdomen and pelvis was performed using the standard protocol following bolus administration of intravenous contrast. CONTRAST:  OMNIPAQUE IOHEXOL 300 MG/ML  SOLN COMPARISON:  CT abdomen pelvis dated February 14, 2016. FINDINGS: Lower chest: No acute abnormality. Hepatobiliary: No focal liver abnormality is seen. No gallstones, gallbladder wall thickening, or biliary dilatation. Pancreas: Unremarkable. No pancreatic ductal dilatation or surrounding inflammatory changes. Spleen: Normal in size without focal abnormality. Adrenals/Urinary Tract: Adrenal glands are unremarkable. Kidneys are normal, without renal calculi, focal lesion, or hydronephrosis. Bladder is unremarkable. Stomach/Bowel: Stomach is within normal limits. Appendix appears normal. No evidence of bowel wall thickening, distention, or inflammatory changes. Vascular/Lymphatic: No significant vascular findings are present. No enlarged abdominal or pelvic lymph nodes. Reproductive: The uterus and ovaries are unremarkable. Elongated tubular structure in the right adnexa with thin septation, mild wall thickening and slight surrounding inflammatory change. This measures approximately 4.9 x 2.1 cm (AP x TR). Other: No abdominal wall hernia or abnormality. No abdominopelvic ascites. No pneumoperitoneum. Musculoskeletal: No acute or significant osseous findings. IMPRESSION: 1. Elongated 4.9 x 2.1 cm tubular structure in the right  adnexa suspicious for hydrosalpinx. Pyosalpinx is not excluded given slight wall thickening and surrounding inflammatory change. Correlate with clinical and laboratory signs of infection and consider pelvic ultrasound for further evaluation. 2. Normal  appendix. These results will be called to the ordering clinician or representative by the Radiology Department at the imaging location. Electronically Signed   By: Obie DredgeWilliam T Derry M.D.   On: 12/19/2018 15:10    Assessment: Principal Problem:   TOA (tubo-ovarian abscess), right Active Problems:   BV (bacterial vaginosis)  Plan: Admit to Med-Surg floor, preferably 6N IV Cefotetan ordered; also ordered PO Doxycycline and Metronidazole Patient will be on IV antibiotics until improvement in pain, or 24-48 hours, then complete 14 day course of Doxycycline and Metronidazole. If condition worsens, may need further intervention Analgesia ordered as needed Regular diet, regular activity, SCDs for VTE prophylaxis. Continue close observation and routine floor care    Jaynie CollinsUGONNA  Martise Waddell, MD, FACOG Obstetrician & Gynecologist, [facility], [facility]

## 2018-12-20 NOTE — Progress Notes (Signed)
Patient arrived to 6N5 via wheelchair from ED. AOx4, VSS, ambulatory, with ongoing IV antibiotic. No complaints of pain at this time, oriented to room, call bell, phone within reach, use of bed controls. Explained visitation policy. Will continue monitoring patient.

## 2018-12-21 LAB — CBC WITH DIFFERENTIAL/PLATELET
Abs Immature Granulocytes: 0.01 10*3/uL (ref 0.00–0.07)
Basophils Absolute: 0 10*3/uL (ref 0.0–0.1)
Basophils Relative: 1 %
Eosinophils Absolute: 0.2 10*3/uL (ref 0.0–0.5)
Eosinophils Relative: 3 %
HCT: 36.9 % (ref 36.0–46.0)
Hemoglobin: 12.4 g/dL (ref 12.0–15.0)
Immature Granulocytes: 0 %
Lymphocytes Relative: 30 %
Lymphs Abs: 2 10*3/uL (ref 0.7–4.0)
MCH: 32.4 pg (ref 26.0–34.0)
MCHC: 33.6 g/dL (ref 30.0–36.0)
MCV: 96.3 fL (ref 80.0–100.0)
Monocytes Absolute: 0.6 10*3/uL (ref 0.1–1.0)
Monocytes Relative: 10 %
Neutro Abs: 3.6 10*3/uL (ref 1.7–7.7)
Neutrophils Relative %: 56 %
Platelets: 214 10*3/uL (ref 150–400)
RBC: 3.83 MIL/uL — ABNORMAL LOW (ref 3.87–5.11)
RDW: 13.1 % (ref 11.5–15.5)
WBC: 6.5 10*3/uL (ref 4.0–10.5)
nRBC: 0 % (ref 0.0–0.2)

## 2018-12-21 LAB — BASIC METABOLIC PANEL
Anion gap: 9 (ref 5–15)
BUN: 10 mg/dL (ref 6–20)
CO2: 22 mmol/L (ref 22–32)
Calcium: 8.1 mg/dL — ABNORMAL LOW (ref 8.9–10.3)
Chloride: 108 mmol/L (ref 98–111)
Creatinine, Ser: 1.09 mg/dL — ABNORMAL HIGH (ref 0.44–1.00)
GFR calc Af Amer: >60 mL/min (ref >60)
GFR calc non Af Amer: >60 mL/min (ref >60)
Glucose, Bld: 90 mg/dL (ref 70–99)
Potassium: 3.7 mmol/L (ref 3.5–5.1)
Sodium: 139 mmol/L (ref 135–145)

## 2018-12-21 NOTE — Progress Notes (Signed)
Patient vomited x1, prn med given. Will continue to monitor

## 2018-12-21 NOTE — Progress Notes (Signed)
Subjective: Patient reports feels a little better today No emesis, nausea improved.     Past Surgical History:  Procedure Laterality Date  . cystic ovarian mass  01/2005  . ELBOW LIGAMENT RECONSTRUCTION      Objective: I have reviewed patient's vital signs, intake and output, medications, labs and radiology results.  Blood pressure 101/68, pulse (!) 57, temperature 98.7 F (37.1 C), temperature source Oral, resp. rate 15, height 5\' 3"  (1.6 m), weight 65 kg, last menstrual period 12/15/2018, SpO2 100 %.  Vitals:   12/20/18 1437 12/20/18 2102 12/21/18 0300 12/21/18 0506  BP: 127/81 104/60  101/68  Pulse: (!) 59 (!) 51  (!) 57  Resp: 19   15  Temp: 98.2 F (36.8 C) 97.8 F (36.6 C)  98.7 F (37.1 C)  TempSrc: Oral Oral  Oral  SpO2: 100% 100%  100%  Weight:   65 kg   Height:   5\' 3"  (1.6 m)      General: alert, cooperative and no distress GI: soft, non-tender; bowel sounds normal; no masses,  no organomegaly and abnormal findings:  moderate tenderness in the RLQ  CBC Latest Ref Rng & Units 12/21/2018 12/20/2018 12/19/2018  WBC 4.0 - 10.5 K/uL 6.5 8.0 5.7  Hemoglobin 12.0 - 15.0 g/dL 12.4 15.0 14.7  Hematocrit 36.0 - 46.0 % 36.9 44.7 44.6  Platelets 150 - 400 K/uL 214 283 274.0    Assessment/Plan: Right pyosalpinx : 1st day of IV antibiotics  Anticipate 72 hours total of IV abx: cefotan/doxycycline/flagyl Will probably do a home oral to complete 14 day course  Pt is interested in achieving pregnancy in the future  LOS: 1 day    Florian Buff 12/21/2018, 7:28 AM

## 2018-12-22 LAB — CBC WITH DIFFERENTIAL/PLATELET
Abs Immature Granulocytes: 0 10*3/uL (ref 0.00–0.07)
Basophils Absolute: 0 10*3/uL (ref 0.0–0.1)
Basophils Relative: 1 %
Eosinophils Absolute: 0.2 10*3/uL (ref 0.0–0.5)
Eosinophils Relative: 4 %
HCT: 36.9 % (ref 36.0–46.0)
Hemoglobin: 12.6 g/dL (ref 12.0–15.0)
Immature Granulocytes: 0 %
Lymphocytes Relative: 45 %
Lymphs Abs: 2.5 10*3/uL (ref 0.7–4.0)
MCH: 32.5 pg (ref 26.0–34.0)
MCHC: 34.1 g/dL (ref 30.0–36.0)
MCV: 95.1 fL (ref 80.0–100.0)
Monocytes Absolute: 0.6 10*3/uL (ref 0.1–1.0)
Monocytes Relative: 10 %
Neutro Abs: 2.2 10*3/uL (ref 1.7–7.7)
Neutrophils Relative %: 40 %
Platelets: 217 10*3/uL (ref 150–400)
RBC: 3.88 MIL/uL (ref 3.87–5.11)
RDW: 12.6 % (ref 11.5–15.5)
WBC: 5.5 10*3/uL (ref 4.0–10.5)
nRBC: 0 % (ref 0.0–0.2)

## 2018-12-22 LAB — BASIC METABOLIC PANEL
Anion gap: 5 (ref 5–15)
BUN: 8 mg/dL (ref 6–20)
CO2: 25 mmol/L (ref 22–32)
Calcium: 8.6 mg/dL — ABNORMAL LOW (ref 8.9–10.3)
Chloride: 108 mmol/L (ref 98–111)
Creatinine, Ser: 1.17 mg/dL — ABNORMAL HIGH (ref 0.44–1.00)
GFR calc Af Amer: >60 mL/min (ref >60)
GFR calc non Af Amer: 59 mL/min — ABNORMAL LOW (ref >60)
Glucose, Bld: 86 mg/dL (ref 70–99)
Potassium: 4.3 mmol/L (ref 3.5–5.1)
Sodium: 138 mmol/L (ref 135–145)

## 2018-12-22 NOTE — Plan of Care (Signed)

## 2018-12-23 ENCOUNTER — Other Ambulatory Visit: Payer: Managed Care, Other (non HMO)

## 2018-12-23 ENCOUNTER — Encounter (HOSPITAL_COMMUNITY): Payer: Self-pay

## 2018-12-23 LAB — CBC WITH DIFFERENTIAL/PLATELET
Abs Immature Granulocytes: 0.01 10*3/uL (ref 0.00–0.07)
Basophils Absolute: 0 10*3/uL (ref 0.0–0.1)
Basophils Relative: 1 %
Eosinophils Absolute: 0.2 10*3/uL (ref 0.0–0.5)
Eosinophils Relative: 3 %
HCT: 39.4 % (ref 36.0–46.0)
Hemoglobin: 13.7 g/dL (ref 12.0–15.0)
Immature Granulocytes: 0 %
Lymphocytes Relative: 33 %
Lymphs Abs: 2 10*3/uL (ref 0.7–4.0)
MCH: 32.9 pg (ref 26.0–34.0)
MCHC: 34.8 g/dL (ref 30.0–36.0)
MCV: 94.5 fL (ref 80.0–100.0)
Monocytes Absolute: 0.7 10*3/uL (ref 0.1–1.0)
Monocytes Relative: 11 %
Neutro Abs: 3.2 10*3/uL (ref 1.7–7.7)
Neutrophils Relative %: 52 %
Platelets: 228 10*3/uL (ref 150–400)
RBC: 4.17 MIL/uL (ref 3.87–5.11)
RDW: 12.4 % (ref 11.5–15.5)
WBC: 6.1 10*3/uL (ref 4.0–10.5)
nRBC: 0 % (ref 0.0–0.2)

## 2018-12-23 LAB — BASIC METABOLIC PANEL WITH GFR
Anion gap: 6 (ref 5–15)
BUN: 6 mg/dL (ref 6–20)
CO2: 25 mmol/L (ref 22–32)
Calcium: 8.6 mg/dL — ABNORMAL LOW (ref 8.9–10.3)
Chloride: 105 mmol/L (ref 98–111)
Creatinine, Ser: 1.09 mg/dL — ABNORMAL HIGH (ref 0.44–1.00)
GFR calc Af Amer: >60 mL/min
GFR calc non Af Amer: >60 mL/min
Glucose, Bld: 90 mg/dL (ref 70–99)
Potassium: 4.4 mmol/L (ref 3.5–5.1)
Sodium: 136 mmol/L (ref 135–145)

## 2018-12-23 LAB — GC/CHLAMYDIA PROBE AMP (~~LOC~~) NOT AT ARMC
Chlamydia: NEGATIVE
Neisseria Gonorrhea: NEGATIVE

## 2018-12-23 MED ORDER — OXYCODONE-ACETAMINOPHEN 5-325 MG PO TABS: 1.0000 | ORAL_TABLET | ORAL | 0 refills | Status: DC | PRN

## 2018-12-23 MED ORDER — ONDANSETRON HCL 4 MG PO TABS: 4.0000 mg | ORAL_TABLET | Freq: Four times a day (QID) | ORAL | 0 refills | Status: DC | PRN

## 2018-12-23 MED ORDER — CIPROFLOXACIN HCL 500 MG PO TABS: 500.0000 mg | ORAL_TABLET | Freq: Two times a day (BID) | ORAL | 0 refills | Status: DC

## 2018-12-23 MED ORDER — IBUPROFEN 600 MG PO TABS: 600.0000 mg | ORAL_TABLET | Freq: Four times a day (QID) | ORAL | 0 refills | Status: DC | PRN

## 2018-12-23 MED ORDER — METRONIDAZOLE 500 MG PO TABS: 500.0000 mg | ORAL_TABLET | Freq: Two times a day (BID) | ORAL | 0 refills | Status: DC

## 2018-12-23 NOTE — Discharge Instructions (Signed)
Pyosalpinx/PID  Pelvic inflammatory disease (PID) is caused by an infection in some or all of the female reproductive organs. The infection can be in the uterus, ovaries, fallopian tubes, or the surrounding tissues in the pelvis. PID can cause abdominal or pelvic pain that comes on suddenly (acute pelvic pain). PID is a serious infection because it can lead to lasting (chronic) pelvic pain or the inability to have children (infertility). What are the causes? This condition is most often caused by bacteria that is spread during sexual contact. It can also be caused by a bacterial infection of the vagina (bacterial vaginosis) that is not spread by sexual contact. This condition occurs when the infection is not treated and the bacteria travel upward from the vagina or cervix into the reproductive organs. Bacteria may also be introduced into the reproductive organs following:  The birth of a baby.  A miscarriage.  An abortion.  Major pelvic surgery.  The insertion of an intrauterine device (IUD).  A sexual assault. What increases the risk? You are more likely to develop this condition if you:  Are younger than 38 years of age.  Are sexually active at a young age.  Have a history of STI (sexually transmitted infection) or PID.  Do not regularly use barrier contraception methods, such as condoms.  Have multiple sexual partners.  Have sex with someone who has symptoms of an STI.  Use a vaginal douche.  Have recently had an IUD inserted. What are the signs or symptoms? Symptoms of this condition include:  Abdominal or pelvic pain.  Fever.  Chills.  Abnormal vaginal discharge.  Abnormal uterine bleeding.  Unusual pain shortly after the end of a menstrual period.  Painful urination.  Pain with sex.  Nausea and vomiting. How is this diagnosed? This condition is diagnosed based on a pelvic exam and medical history. A pelvic exam can reveal signs of infection,  inflammation, and discharge in the vagina and the surrounding tissues. It can also help to identify painful areas. You may also have tests, such as:  Lab tests, including a pregnancy test, blood tests, and a urine test.  Culture tests of the vagina and cervix to check for an STI.  Ultrasound.  A laparoscopic procedure to look inside the pelvis.  Examination of vaginal discharge under a microscope. How is this treated? This condition may be treated with:  Antibiotic medicines taken by mouth (orally). For more severe cases, antibiotics may be given through an IV at the hospital.  Surgery. This is rare. Surgery may be needed if other treatments do not help.  Efforts to stop the spread of the infection. Sexual partners may need to be treated if the infection is caused by an STI. It may take weeks until you are completely well. If you are diagnosed with PID, you should also be checked for HIV (human immunodeficiency virus). Your health care provider may test you for infection again 3 months after treatment. You should not have unprotected sex. Follow these instructions at home:  Take over-the-counter and prescription medicines only as told by your health care provider.  If you were prescribed an antibiotic medicine, take it as told by your health care provider. Do not stop using the antibiotic even if you start to feel better.  Do not have sex until treatment is completed or as told by your health care provider. If PID is confirmed, your recent sexual partners will need treatment, especially if you had unprotected sex.  Keep all follow-up visits  as told by your health care provider. This is important. Contact a health care provider if:  You have increased or abnormal vaginal discharge.  Your pain does not improve.  You vomit.  You have a fever.  You cannot tolerate your medicines.  Your partner has an STI.  You have pain when you urinate. Get help right away if:  You have  increased abdominal or pelvic pain.  You have chills.  Your symptoms are not better in 72 hours with treatment. Summary  Pelvic inflammatory disease (PID) is caused by an infection in some or all of the female reproductive organs.  PID is a serious infection because it can lead to lasting (chronic) pelvic pain or the inability to have children (infertility).  This infection is usually treated with antibiotic medicines.  Do not have sex until treatment is completed or as told by your health care provider. This information is not intended to replace advice given to you by your health care provider. Make sure you discuss any questions you have with your health care provider. Document Released: 01/16/2005 Document Revised: 10/04/2017 Document Reviewed: 10/09/2017 Elsevier Patient Education  2020 [employer].

## 2018-12-23 NOTE — Progress Notes (Signed)
Right Pyosalpinx  Subjective: Patient reports improvement in symptoms.    Objective: I have reviewed patient's vital signs, intake and output, medications, labs and radiology results.  General: alert, cooperative and no distress GI: soft, non-tender; bowel sounds normal; no masses,  no organomegaly and abnormal findings:  mild tenderness in the RLQ   CBC Latest Ref Rng & Units 12/23/2018 12/22/2018 12/21/2018  WBC 4.0 - 10.5 K/uL 6.1 5.5 6.5  Hemoglobin 12.0 - 15.0 g/dL 13.7 12.6 12.4  Hematocrit 36.0 - 46.0 % 39.4 36.9 36.9  Platelets 150 - 400 K/uL 228 217 214     Assessment/Plan: Rigtht pyosalpinx s/p 2 days IV antibiotics Discharge tomorrow  LOS: 3 days    Florian Buff 12/23/2018, 7:42 AM

## 2018-12-23 NOTE — Discharge Summary (Signed)
Physician Discharge Summary  Patient ID: Shannon Dyer MRN: [id number] DOB/AGE: 1981-01-08 38 y.o.  Admit date: 12/20/2018 Discharge date: 12/23/2018  Admission Diagnoses: Right pyosalpinx  Discharge Diagnoses:  Principal Problem:   TOA (tubo-ovarian abscess), right Active Problems:   BV (bacterial vaginosis)   Discharged Condition: stable  Hospital Course: Pt was admitted for right sided pyosalpinx and placed on cefotan/flagyl/doxycycline with immediate improvement in clinical symptoms as well as clinical data.  She received 72 hours of IV antibiotics with significant improvement but not complete resolution of her right sided pain.  Her peritoneal signs resolved  Consults: None  Significant Diagnostic Studies: labs:   CBC Latest Ref Rng & Units 12/23/2018 12/22/2018 12/21/2018  WBC 4.0 - 10.5 K/uL 6.1 5.5 6.5  Hemoglobin 12.0 - 15.0 g/dL 13.7 12.6 12.4  Hematocrit 36.0 - 46.0 % 39.4 36.9 36.9  Platelets 150 - 400 K/uL 228 217 214   Ct Abdomen Pelvis W Contrast  Result Date: 12/19/2018 CLINICAL DATA:  Lower abdominal and pelvic pain since menstrual period began on Sunday. EXAM: CT ABDOMEN AND PELVIS WITH CONTRAST TECHNIQUE: Multidetector CT imaging of the abdomen and pelvis was performed using the standard protocol following bolus administration of intravenous contrast. CONTRAST:  121mL OMNIPAQUE IOHEXOL 300 MG/ML  SOLN COMPARISON:  CT abdomen pelvis dated February 14, 2016. FINDINGS: Lower chest: No acute abnormality. Hepatobiliary: No focal liver abnormality is seen. No gallstones, gallbladder wall thickening, or biliary dilatation. Pancreas: Unremarkable. No pancreatic ductal dilatation or surrounding inflammatory changes. Spleen: Normal in size without focal abnormality. Adrenals/Urinary Tract: Adrenal glands are unremarkable. Kidneys are normal, without renal calculi, focal lesion, or hydronephrosis. Bladder is unremarkable. Stomach/Bowel: Stomach is within normal limits.  Appendix appears normal. No evidence of bowel wall thickening, distention, or inflammatory changes. Vascular/Lymphatic: No significant vascular findings are present. No enlarged abdominal or pelvic lymph nodes. Reproductive: The uterus and ovaries are unremarkable. Elongated tubular structure in the right adnexa with thin septation, mild wall thickening and slight surrounding inflammatory change. This measures approximately 4.9 x 2.1 cm (AP x TR). Other: No abdominal wall hernia or abnormality. No abdominopelvic ascites. No pneumoperitoneum. Musculoskeletal: No acute or significant osseous findings. IMPRESSION: 1. Elongated 4.9 x 2.1 cm tubular structure in the right adnexa suspicious for hydrosalpinx. Pyosalpinx is not excluded given slight wall thickening and surrounding inflammatory change. Correlate with clinical and laboratory signs of infection and consider pelvic ultrasound for further evaluation. 2. Normal appendix. These results will be called to the ordering clinician or representative by the Radiology Department at the imaging location. Electronically Signed   By: Titus Dubin M.D.   On: 12/19/2018 15:10    Treatments: antibiotics: cefotan/flagyl/doxycycline  Discharge Exam: Blood pressure 118/66, pulse 64, temperature 98.5 F (36.9 C), temperature source Oral, resp. rate 18, height 5\' 3"  (1.6 m), weight 65 kg, last menstrual period 12/15/2018, SpO2 99 %. General appearance: alert, cooperative and no distress GI: soft non tender except RLQ mild no rebound  Disposition: Discharge disposition: 01-Home or Self Care       Discharge Instructions    Call MD for:  persistant nausea and vomiting   Complete by: As directed    Call MD for:  severe uncontrolled pain   Complete by: As directed    Call MD for:  temperature >100.4   Complete by: As directed    Diet - low sodium heart healthy   Complete by: As directed    Increase activity slowly   Complete by: As directed  Allergies as of 12/23/2018   No Known Allergies     Medication List    TAKE these medications   ciprofloxacin 500 MG tablet Commonly known as: Cipro Take 1 tablet (500 mg total) by mouth 2 (two) times daily.   ibuprofen 600 MG tablet Commonly known as: ADVIL Take 1 tablet (600 mg total) by mouth every 6 (six) hours as needed (mild pain).   metroNIDAZOLE 500 MG tablet Commonly known as: FLAGYL Take 1 tablet (500 mg total) by mouth 2 (two) times daily.   ondansetron 4 MG tablet Commonly known as: ZOFRAN Take 1 tablet (4 mg total) by mouth every 6 (six) hours as needed for nausea.   oxyCODONE-acetaminophen 5-325 MG tablet Commonly known as: PERCOCET/ROXICET Take 1-2 tablets by mouth every 3 (three) hours as needed for severe pain (moderate to severe pain (when tolerating fluids)).   PROVENTIL HFA IN Inhale 2 puffs into the lungs daily as needed (shortness of breath).      Follow-up Information    [facility] Follow up in 10 day(s).   Specialty: Obstetrics and Gynecology Why: follow up Contact information: [address] [place] [address] [phone]          Signed: Lazaro Arms 12/23/2018, 7:34 AM

## 2019-01-14 ENCOUNTER — Other Ambulatory Visit: Payer: Self-pay | Admitting: Family Medicine

## 2019-03-14 ENCOUNTER — Other Ambulatory Visit: Payer: Managed Care, Other (non HMO)

## 2020-09-13 ENCOUNTER — Ambulatory Visit (INDEPENDENT_AMBULATORY_CARE_PROVIDER_SITE_OTHER): Payer: Self-pay | Admitting: Family Medicine

## 2020-09-13 ENCOUNTER — Other Ambulatory Visit: Payer: Self-pay

## 2020-09-13 ENCOUNTER — Encounter: Payer: Self-pay | Admitting: Family Medicine

## 2020-09-13 VITALS — BP 126/68 | HR 76 | Temp 98.1°F | Ht 63.0 in | Wt 155.4 lb

## 2020-09-13 DIAGNOSIS — G90511 Complex regional pain syndrome I of right upper limb: Secondary | ICD-10-CM | POA: Insufficient documentation

## 2020-09-13 DIAGNOSIS — F341 Dysthymic disorder: Secondary | ICD-10-CM

## 2020-09-13 NOTE — Patient Instructions (Signed)
Take care of yourself   I placed a referral to both psychiatry (to discuss medication)  Psychology (for counseling)  You will get a call   Continue breathing practice and meditation  Go on walks /try to exercise as tolerated  Get outdoor time

## 2020-09-13 NOTE — Assessment & Plan Note (Signed)
PHQ-9 score of 19 today  Pt has done well for several years and now more depressed and anxious again with some stressors (disability from work wit R elbow injury) and complex regional pain syndrome  Needs more support  Good insight  Last saw psychiatrist over 2 y ago  Tried and failed multiple medications including paxil, zolft, remeron, wellbutrin, buspar and effexor  Reviewed stressors/ coping techniques/symptoms/ support sources/ tx options and side effects in detail today  Commended self care including meditation /mindfulness  Referral made for counstling (CBT)- she is familiar and found helpful in the past Also ref to psychiatry for eval and tx  No SI currently  Disc need for attn if this develop or if symptoms worsen

## 2020-09-13 NOTE — Assessment & Plan Note (Signed)
Of RUE - with epicondylitis  Followed by orthopedics Arm is taped today  Unable to work with this-frustrated over it  Also limited in day to day life (very active)
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
## 2020-09-22 ENCOUNTER — Encounter: Payer: Self-pay | Admitting: *Deleted

## 2020-10-23 IMAGING — MG DIGITAL DIAGNOSTIC BILATERAL MAMMOGRAM WITH TOMO AND CAD
6 of 10 series · 6 of 30 positions shown · non-contrast
Comparison: None

CLINICAL DATA: Patient presents for focal tenderness and palpable
abnormality within the retroareolar left breast.

EXAM:
DIGITAL DIAGNOSTIC BILATERAL MAMMOGRAM WITH CAD AND TOMO
ULTRASOUND LEFT BREAST

[L CC synth-2D]
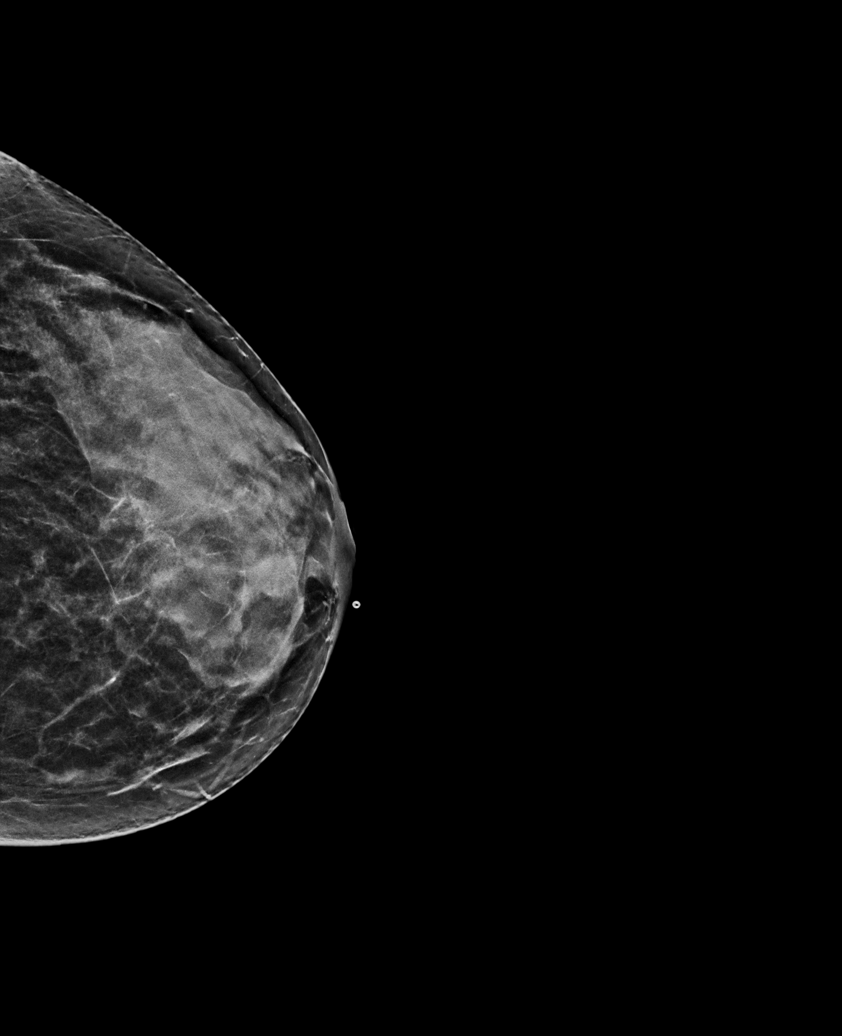

[L MLO synth-2D]
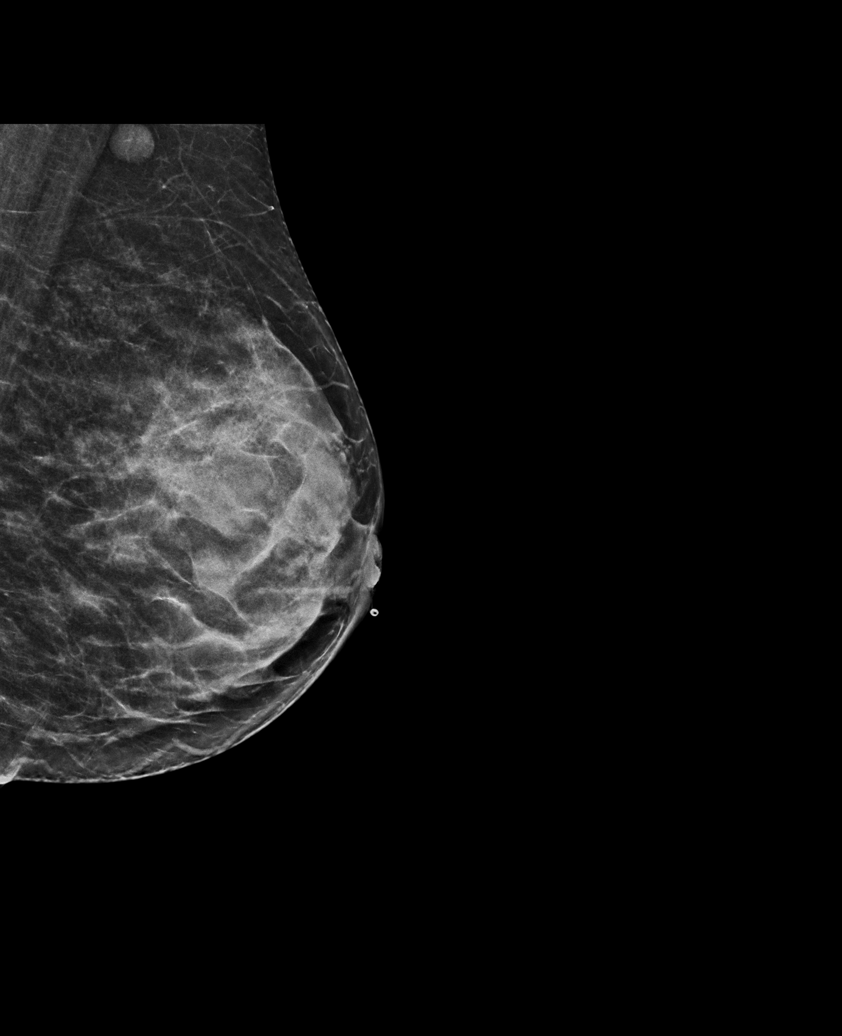

[R MLO synth-2D]
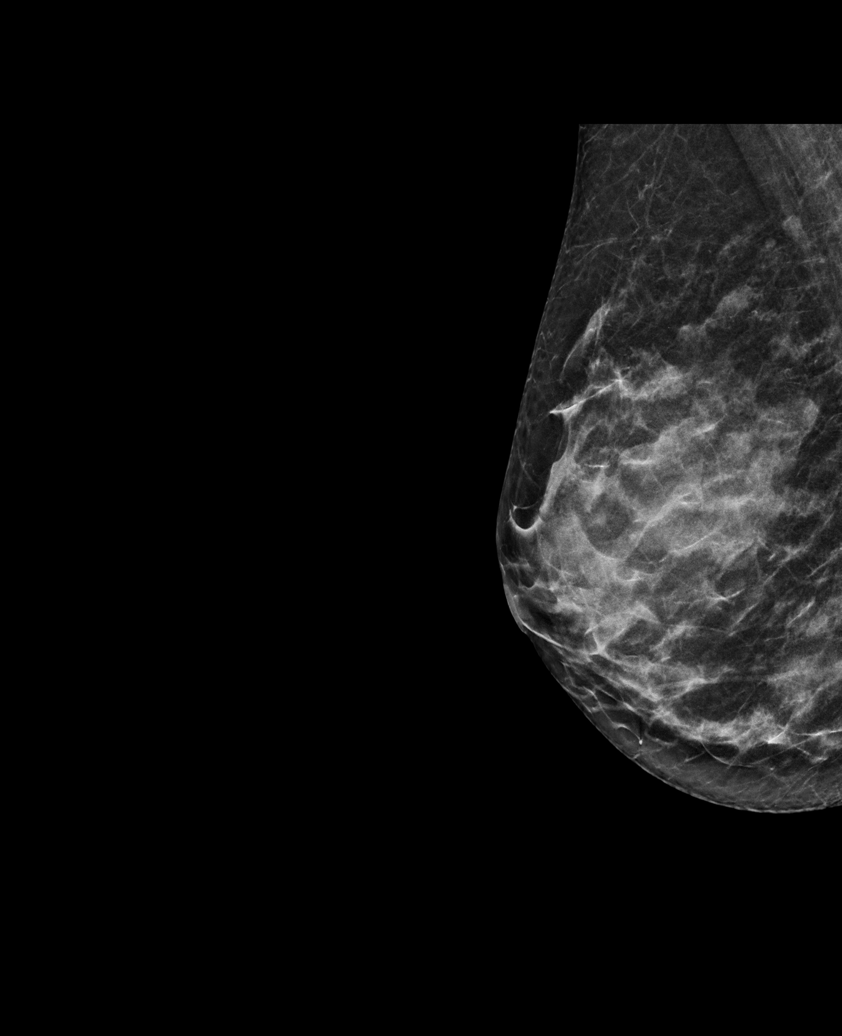

[R CC synth-2D]
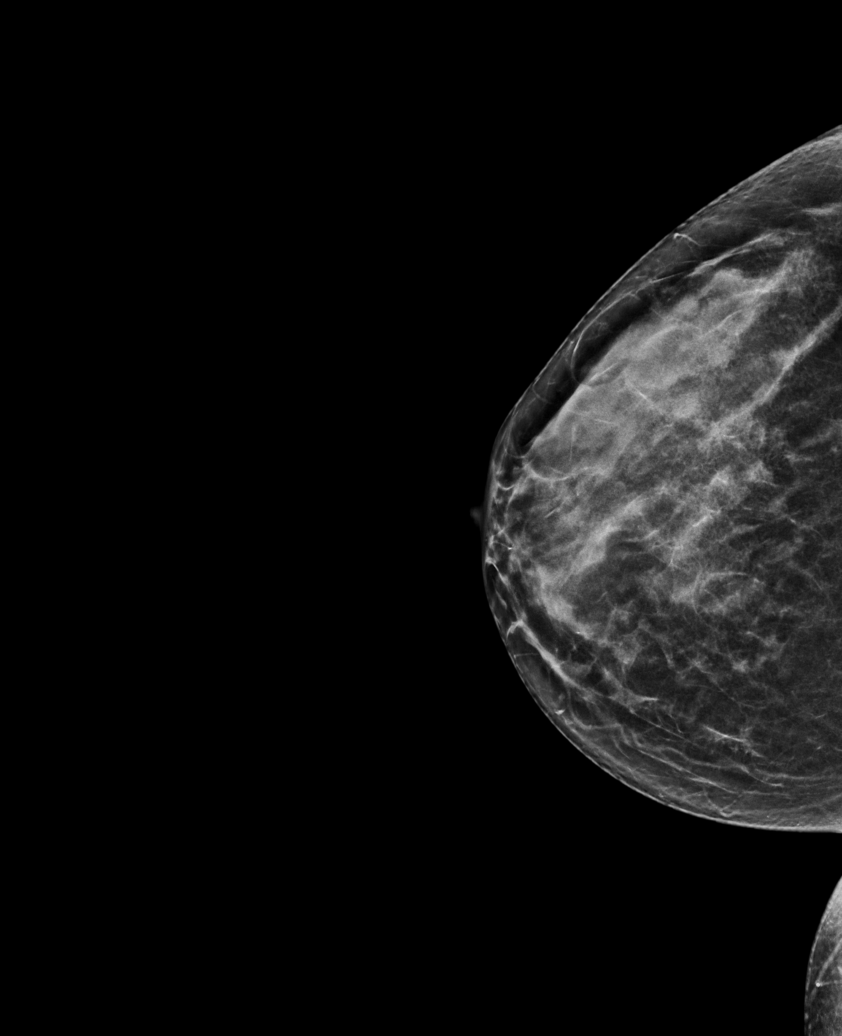

[L TAN synth-2D]
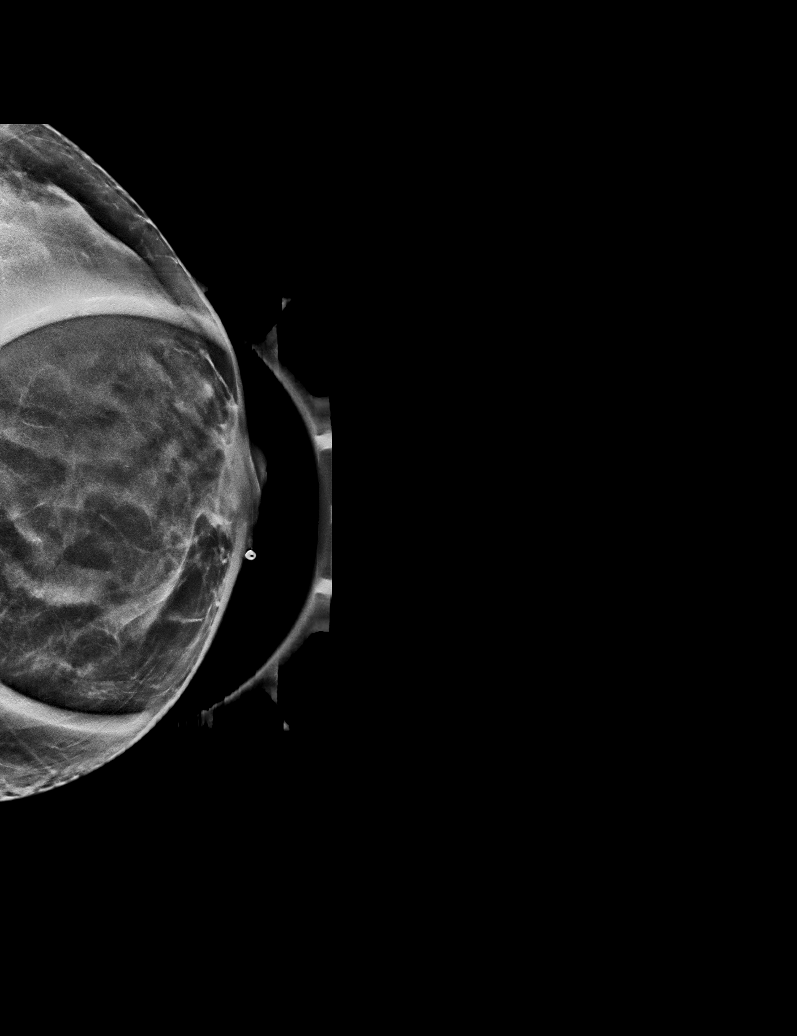

[L MLO tomo · tomo slice 29/57.0]
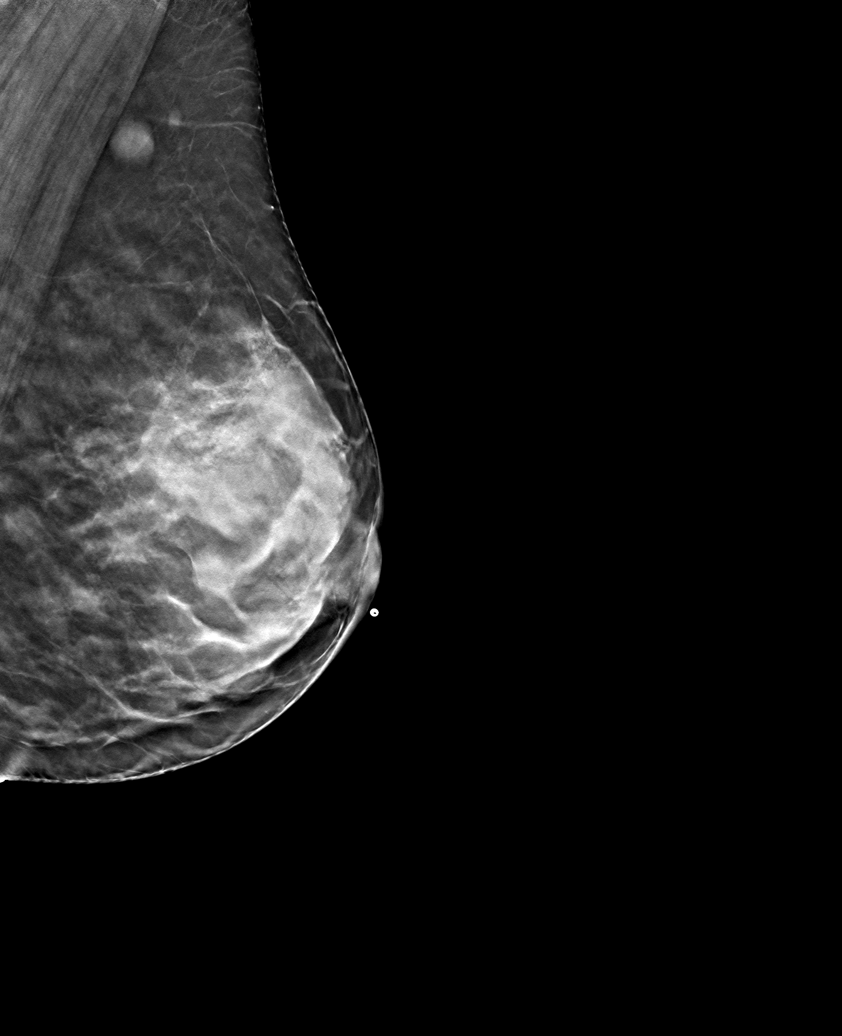

[6 of 30 positions shown; findings below may reference images not displayed]

ACR Breast Density Category c: The breast tissue is heterogeneously
dense, which may obscure small masses.
FINDINGS: No concerning masses, calcifications or distortion identified within
the left breast. Within the lower left axilla there is a round mass.

Mammographic images were processed with CAD.

On physical exam, there is mild cutaneous redness along the medial
aspect of the left nipple. There is a small palpable nodule
underlying the medial left nipple.

Targeted ultrasound is performed, showing a 2.1 x 0.9 x 1.9 cm
retroareolar lobular hypoechoic mass left breast 9 o'clock position
retroareolar location with internal color vascularity.

There is a cortically thickened left axillary lymph node measuring 9
mm. There are 3 adjacent cortically thickened lower axillary nodes.
IMPRESSION: 1. Indeterminate hypoechoic mass within the retroareolar left
breast. Given the appearance and clinical symptomatology, this is
favored to represent phlegmonous change/infection without definable
fluid collection.
2. Cortically thickened left axillary lymph nodes, potentially
reactive in etiology.

RECOMMENDATION:
1. Patient will be started on 7 day course of Bactrim.
2. Patient will return in 7 days to reassess the retroareolar mass
and left axillary adenopathy. If the retroareolar mass persists,
ultrasound-guided core needle biopsy would be recommended to exclude
malignant process. Additionally, ultrasound-guided core needle
biopsy of 1 of the cortically thickened nodes in the left axilla
would be recommended.

I have discussed the findings and recommendations with the patient.
Results were also provided in writing at the conclusion of the
visit. If applicable, a reminder letter will be sent to the patient
regarding the next appointment.

BI-RADS CATEGORY  3: Probably benign.

## 2022-03-24 ENCOUNTER — Emergency Department (HOSPITAL_COMMUNITY)
Admission: EM | Admit: 2022-03-24 | Discharge: 2022-03-25 | Discharge status: Against Medical Advice | Payer: Self-pay | Attending: Emergency Medicine | Admitting: Emergency Medicine

## 2022-03-24 ENCOUNTER — Emergency Department (HOSPITAL_COMMUNITY): Payer: Self-pay

## 2022-03-24 ENCOUNTER — Encounter (HOSPITAL_COMMUNITY): Payer: Self-pay

## 2022-03-24 ENCOUNTER — Other Ambulatory Visit: Payer: Self-pay

## 2022-03-24 DIAGNOSIS — Y929 Unspecified place or not applicable: Secondary | ICD-10-CM | POA: Insufficient documentation

## 2022-03-24 DIAGNOSIS — Y999 Unspecified external cause status: Secondary | ICD-10-CM | POA: Insufficient documentation

## 2022-03-24 DIAGNOSIS — R519 Headache, unspecified: Secondary | ICD-10-CM | POA: Insufficient documentation

## 2022-03-24 DIAGNOSIS — S060X1A Concussion with loss of consciousness of 30 minutes or less, initial encounter: Secondary | ICD-10-CM

## 2022-03-24 DIAGNOSIS — Y939 Activity, unspecified: Secondary | ICD-10-CM | POA: Insufficient documentation

## 2022-03-24 DIAGNOSIS — W19XXXA Unspecified fall, initial encounter: Secondary | ICD-10-CM | POA: Insufficient documentation

## 2022-03-24 DIAGNOSIS — R569 Unspecified convulsions: Secondary | ICD-10-CM | POA: Insufficient documentation

## 2022-03-24 LAB — BASIC METABOLIC PANEL
Anion gap: 10 (ref 5–15)
BUN: 10 mg/dL (ref 6–20)
CO2: 23 mmol/L (ref 22–32)
Calcium: 9.5 mg/dL (ref 8.9–10.3)
Chloride: 102 mmol/L (ref 98–111)
Creatinine, Ser: 0.95 mg/dL (ref 0.44–1.00)
GFR, Estimated: >60 mL/min (ref >60)
Glucose, Bld: 84 mg/dL (ref 70–99)
Potassium: 3.8 mmol/L (ref 3.5–5.1)
Sodium: 135 mmol/L (ref 135–145)

## 2022-03-24 LAB — CBC
HCT: 41.3 % (ref 36.0–46.0)
Hemoglobin: 13.7 g/dL (ref 12.0–15.0)
MCH: 32.8 pg (ref 26.0–34.0)
MCHC: 33.2 g/dL (ref 30.0–36.0)
MCV: 98.8 fL (ref 80.0–100.0)
Platelets: 262 10*3/uL (ref 150–400)
RBC: 4.18 MIL/uL (ref 3.87–5.11)
RDW: 13.1 % (ref 11.5–15.5)
WBC: 7.3 10*3/uL (ref 4.0–10.5)
nRBC: 0 % (ref 0.0–0.2)

## 2022-03-24 LAB — URINALYSIS, ROUTINE W REFLEX MICROSCOPIC
Bilirubin Urine: NEGATIVE
Glucose, UA: NEGATIVE mg/dL
Hgb urine dipstick: NEGATIVE
Ketones, ur: NEGATIVE mg/dL
Leukocytes,Ua: NEGATIVE
Nitrite: NEGATIVE
Protein, ur: NEGATIVE mg/dL
Specific Gravity, Urine: 1.006 (ref 1.005–1.030)
pH: 5 (ref 5.0–8.0)

## 2022-03-24 LAB — CBG MONITORING, ED: Glucose-Capillary: 89 mg/dL (ref 70–99)

## 2022-03-24 LAB — I-STAT BETA HCG BLOOD, ED (MC, WL, AP ONLY): I-stat hCG, quantitative: <5 m[IU]/mL (ref <5)

## 2022-03-24 MED ORDER — KETOROLAC TROMETHAMINE 30 MG/ML IJ SOLN: 30.0000 mg | Freq: Once | INTRAMUSCULAR | Status: DC

## 2022-03-24 MED ORDER — ONDANSETRON HCL 4 MG/2ML IJ SOLN
4.0000 mg | Freq: Once | INTRAMUSCULAR | Status: AC
  Filled 2022-03-24: qty 2

## 2022-03-24 MED ORDER — LORAZEPAM 2 MG/ML IJ SOLN
INTRAMUSCULAR | Status: AC
  Administered 2022-03-24: 2 mg via INTRAVENOUS
  Filled 2022-03-24: qty 1

## 2022-03-24 MED ORDER — ONDANSETRON HCL 4 MG/2ML IJ SOLN
INTRAMUSCULAR | Status: AC
  Administered 2022-03-25: 4 mg via INTRAVENOUS
  Filled 2022-03-24: qty 2

## 2022-03-24 MED ORDER — LORAZEPAM 2 MG/ML IJ SOLN: 2.0000 mg | Freq: Once | INTRAMUSCULAR | Status: AC

## 2022-03-24 MED ORDER — LEVETIRACETAM IN NACL 1000 MG/100ML IV SOLN
1000.0000 mg | Freq: Once | INTRAVENOUS | Status: AC
  Administered 2022-03-25: 1000 mg via INTRAVENOUS
  Filled 2022-03-24: qty 100

## 2022-03-24 MED ORDER — HYDROCODONE-ACETAMINOPHEN 5-325 MG PO TABS
1.0000 | ORAL_TABLET | Freq: Once | ORAL | Status: AC
  Administered 2022-03-24: 1 via ORAL
  Filled 2022-03-24: qty 1

## 2022-03-24 NOTE — Discharge Instructions (Addendum)
You were seen in the ER for a concussion. You should read the handout attached for more instructions on managing symptoms at home. The biggest thing to practice is "cognitive rest" with minimal mental exertion over the next 1-2 weeks until your symptoms begin to improve. Please plan to follow up with your primary care provider for further evaluation as needed.

## 2022-03-24 NOTE — ED Triage Notes (Signed)
PER EMS: [place]. She does not remember how she fell. She hit her head on the floor and then bystanders reports she had seizure like activity described as full body convulsions.She denies hx of seizures. EMS reports she was sluggish upon their arrival but has since resolved. She denies neck or back pain.Arrives in [place] for precautions.  Does not take blood thinners. Pt is A&OX4.  Hematoma to left temple of forehead.  BP- 130/94, HR-80, O2-97%, CBG-120

## 2022-03-24 NOTE — ED Provider Notes (Incomplete)
[facility] Provider Note   CSN: [id number] Arrival date & time: 03/24/22  2023     History Chief Complaint  Patient presents with  . Fall    Shannon Dyer is a 42 y.o. female.  Patient presents emergency department following a fall.  Patient reports that she was skating when she lost balance and fell backwards hitting her head against the wall behind her.  Patient denies that she had any syncope or loss of consciousness at that point.  After several minutes patient was skating around and was reportedly looking unstable when she eventually did fall to the ground hitting the left side of her body.  Per EMS report, individuals that were present reported that patient had seizure-like activity for 2 to 3 minutes following this secondary fall, but per patient's partner in the room, patient quickly came out of this episode and was looking to restart her skating activity but was advised to sit down by a nurse that was present at the scene.  Patient denies that she is currently on any blood thinners.  No prior history of epilepsy or other seizure disorder.  Not currently experiencing any neurological deficits.  Does report a mild headache.   Fall Associated symptoms include headaches.       Home Medications Prior to Admission medications   Medication Sig Start Date End Date Taking? Authorizing Provider  Albuterol Sulfate (PROVENTIL HFA IN) Inhale 2 puffs into the lungs daily as needed (shortness of breath).     [provider]  oxyCODONE-acetaminophen (PERCOCET/ROXICET) 5-325 MG tablet Take 1-2 tablets by mouth every 3 (three) hours as needed for severe pain (moderate to severe pain (when tolerating fluids)). 12/23/18   Florian Buff, MD      Allergies    Patient has no known allergies.    Review of Systems   Review of Systems  Neurological:  Positive for syncope and headaches.    Physical Exam Updated Vital Signs BP (!)  124/96   Pulse 70   Temp 98.3 F (36.8 C) (Oral)   Resp 11   Ht '5\' 3"'$  (1.6 m)   Wt 63.5 kg   LMP 02/24/2022 (Approximate)   SpO2 99%   BMI 24.80 kg/m  Physical Exam Vitals and nursing note reviewed.  Constitutional:      Appearance: Normal appearance.  HENT:     Head: Normocephalic and atraumatic.  Eyes:     General: No visual field deficit.    Conjunctiva/sclera: Conjunctivae normal.     Pupils: Pupils are equal, round, and reactive to light.  Cardiovascular:     Rate and Rhythm: Normal rate and regular rhythm.     Pulses: Normal pulses.     Heart sounds: Normal heart sounds.  Pulmonary:     Effort: Pulmonary effort is normal.     Breath sounds: Normal breath sounds.  Abdominal:     General: Abdomen is flat.  Musculoskeletal:        General: Normal range of motion.  Skin:    General: Skin is warm and dry.     Capillary Refill: Capillary refill takes less than 2 seconds.  Neurological:     General: No focal deficit present.     Mental Status: She is alert and oriented to person, place, and time. Mental status is at baseline.     Cranial Nerves: Cranial nerves 2-12 are intact. No cranial nerve deficit.     Motor: No weakness.  Comments: No neurological deficits noted on exam. No slurred speech, increased somnolence, or ataxia.     ED Results / Procedures / Treatments   Labs (all labs ordered are listed, but only abnormal results are displayed) Labs Reviewed  URINALYSIS, ROUTINE W REFLEX MICROSCOPIC - Abnormal; Notable for the following components:      Result Value   Color, Urine STRAW (*)    All other components within normal limits  BASIC METABOLIC PANEL  CBC  ETHANOL  PREGNANCY, URINE  CBG MONITORING, ED  I-STAT BETA HCG BLOOD, ED ([facility], [facility], [facility] ONLY)    EKG EKG Interpretation  Date/Time:  Friday March 24 2022 20:29:27 EST Ventricular Rate:  78 PR Interval:  174 QRS Duration: 92 QT Interval:  359 QTC Calculation: 409 R Axis:   29 Text  Interpretation: Sinus rhythm Consider right atrial enlargement Confirmed by Dene Gentry [phone]) on 03/24/2022 8:59:12 PM  Radiology CT Head Wo Contrast  Result Date: 03/24/2022 CLINICAL DATA:  Moderate to severe head and neck trauma EXAM: CT HEAD WITHOUT CONTRAST CT CERVICAL SPINE WITHOUT CONTRAST TECHNIQUE: Multidetector CT imaging of the head and cervical spine was performed following the standard protocol without intravenous contrast. Multiplanar CT image reconstructions of the cervical spine were also generated. RADIATION DOSE REDUCTION: This exam was performed according to the departmental dose-optimization program which includes automated exposure control, adjustment of the mA and/or kV according to patient size and/or use of iterative reconstruction technique. COMPARISON:  CT head 10/02/2017 and cervical spine radiographs 05/27/2011 FINDINGS: CT HEAD FINDINGS Brain: No evidence of acute infarction, hemorrhage, hydrocephalus, extra-axial collection or mass lesion/mass effect. Vascular: No hyperdense vessel or unexpected calcification. Focal calcification along the right foramen of Luschka. Skull: No fracture or focal lesion. Small hematoma within the left frontal scalp. Sinuses/Orbits: No acute finding. Other: None. CT CERVICAL SPINE FINDINGS Alignment: No evidence of traumatic malalignment. Skull base and vertebrae: No acute fracture. No primary bone lesion or focal pathologic process. Soft tissues and spinal canal: No prevertebral fluid or swelling. No visible canal hematoma. Disc levels: Disc space height is maintained. No significant spinal canal or neural foraminal narrowing. Upper chest: Negative. Other: None. IMPRESSION: 1. No acute intracranial abnormality. 2. No acute fracture in the cervical spine. 3. Small hematoma within the left frontal scalp. Electronically Signed   By: Placido Sou M.D.   On: 03/24/2022 22:39   CT Cervical Spine Wo Contrast  Result Date: 03/24/2022 CLINICAL DATA:   Moderate to severe head and neck trauma EXAM: CT HEAD WITHOUT CONTRAST CT CERVICAL SPINE WITHOUT CONTRAST TECHNIQUE: Multidetector CT imaging of the head and cervical spine was performed following the standard protocol without intravenous contrast. Multiplanar CT image reconstructions of the cervical spine were also generated. RADIATION DOSE REDUCTION: This exam was performed according to the departmental dose-optimization program which includes automated exposure control, adjustment of the mA and/or kV according to patient size and/or use of iterative reconstruction technique. COMPARISON:  CT head 10/02/2017 and cervical spine radiographs 05/27/2011 FINDINGS: CT HEAD FINDINGS Brain: No evidence of acute infarction, hemorrhage, hydrocephalus, extra-axial collection or mass lesion/mass effect. Vascular: No hyperdense vessel or unexpected calcification. Focal calcification along the right foramen of Luschka. Skull: No fracture or focal lesion. Small hematoma within the left frontal scalp. Sinuses/Orbits: No acute finding. Other: None. CT CERVICAL SPINE FINDINGS Alignment: No evidence of traumatic malalignment. Skull base and vertebrae: No acute fracture. No primary bone lesion or focal pathologic process. Soft tissues and spinal canal: No prevertebral fluid or swelling.  No visible canal hematoma. Disc levels: Disc space height is maintained. No significant spinal canal or neural foraminal narrowing. Upper chest: Negative. Other: None. IMPRESSION: 1. No acute intracranial abnormality. 2. No acute fracture in the cervical spine. 3. Small hematoma within the left frontal scalp. Electronically Signed   By: Placido Sou M.D.   On: 03/24/2022 22:39   DG Forearm Right  Result Date: 03/24/2022 CLINICAL DATA:  Fall roller-skating. EXAM: RIGHT FOREARM - 2 VIEW COMPARISON:  None Available. FINDINGS: Normal bone mineralization. No acute fracture within the radius or ulna. Normal alignment at the elbow and wrist. No elbow  joint effusion is seen. IMPRESSION: No acute fracture. Electronically Signed   By: Yvonne Kendall M.D.   On: 03/24/2022 21:52   DG Humerus Right  Result Date: 03/24/2022 CLINICAL DATA:  Fall when roller skating. EXAM: RIGHT HUMERUS - 2+ VIEW COMPARISON:  None Available. FINDINGS: Normal bone mineralization. No acute fracture is seen within the right humerus. The shoulder and elbow appear normally aligned. IMPRESSION: Normal right humerus radiographs . Electronically Signed   By: Yvonne Kendall M.D.   On: 03/24/2022 21:51   DG Shoulder Right  Result Date: 03/24/2022 CLINICAL DATA:  Fall when roller skating. EXAM: RIGHT SHOULDER - 2+ VIEW COMPARISON:  None Available. FINDINGS: Normal bone mineralization. Joint spaces are preserved. No acute fracture or dislocation. The visualized portion of the right lung is unremarkable. IMPRESSION: Normal right shoulder radiographs. Electronically Signed   By: Yvonne Kendall M.D.   On: 03/24/2022 21:50    Procedures Procedures   Medications Ordered in ED Medications  LORazepam (ATIVAN) injection 2 mg (has no administration in time range)  LORazepam (ATIVAN) 2 MG/ML injection (has no administration in time range)  ondansetron (ZOFRAN) injection 4 mg (has no administration in time range)  levETIRAcetam (KEPPRA) IVPB 1000 mg/100 mL premix (has no administration in time range)  ondansetron (ZOFRAN) 4 MG/2ML injection (has no administration in time range)  HYDROcodone-acetaminophen (NORCO/VICODIN) 5-325 MG per tablet 1 tablet (1 tablet Oral Given 03/24/22 2147)    ED Course/ Medical Decision Making/ A&P Clinical Course as of 03/25/22 0001  Fri Mar 24, 2022  2116 [facility]: 32.8 [OZ]    Clinical Course User Index [OZ] Luvenia Heller, PA-C                           Medical Decision Making Amount and/or Complexity of Data Reviewed Labs: ordered. Decision-making details documented in ED Course. Radiology: ordered.  Risk Prescription drug management.   This  patient presents to the ED for concern of fall.  Differential diagnosis includes concussion, TBI, hemorrhagic stroke   Lab Tests:  I Ordered, and personally interpreted labs.  The pertinent results include: Normal CBC, CMP, negative beta-hCG, negative urinalysis   Imaging Studies ordered:  I ordered imaging studies including CT head, CT cervical spine, x-ray of right forearm, right humerus, right shoulder I independently visualized and interpreted imaging which showed no evidence of acute intracranial abnormalities, no fractures or dislocations in right arm, shoulder, forearm I agree with the radiologist interpretation   Medicines ordered and prescription drug management:  I ordered medication including Norco for pain Reevaluation of the patient after these medicines showed that the patient improved I have reviewed the patients home medicines and have made adjustments as needed   Problem List / ED Course:  Patient presents to the ED following a fall. She is not sure of what transpired with the  fall, but reports that she remembers waking up with people around her. Husband reports that she had an initial trip where she hit the back of her head against a wall, began skating, and then appeared to be unsteady. She eventually fell onto left side and had "seizure like activity" according to those at the scene. Husband reports that patient came back quickly from unconsciousness and was behaving like normal. Patient's workup was largely reassuring without acute findings seen on labs or imaging. Patient reports some head pain on reassessment so medications prescribed for pain.  Prior to patient discharge, she had reported seizure like activity witnessed by nursing staff. Patient had seizing controlled with Ativan '2mg'$ . Patient will require inpatient admission and follow with neurology for further evaluation. Repeat CT head ordered.    Final Clinical Impression(s) / ED Diagnoses Final diagnoses:   Fall, initial encounter  Concussion with loss of consciousness of 30 minutes or less, initial encounter    Rx / DC Orders ED Discharge Orders     None

## 2022-03-24 NOTE — ED Provider Notes (Signed)
[facility] Provider Note   CSN: [id number] Arrival date & time: 03/24/22  2023     History Chief Complaint  Patient presents with   Shannon Dyer is a 42 y.o. female.  Patient presents emergency department following a fall.  Patient reports that she was skating when she lost balance and fell backwards hitting her head against the wall behind her.  Patient denies that she had any syncope or loss of consciousness at that point.  After several minutes patient was skating around and was reportedly looking unstable when she eventually did fall to the ground hitting the left side of her body.  Per EMS report, individuals that were present reported that patient had seizure-like activity for 2 to 3 minutes following this secondary fall, but per patient's partner in the room, patient quickly came out of this episode and was looking to restart her skating activity but was advised to sit down by a nurse that was present at the scene.  Patient denies that she is currently on any blood thinners.  No prior history of epilepsy or other seizure disorder.  Not currently experiencing any neurological deficits.  Does report a mild headache.   Fall       Home Medications Prior to Admission medications   Medication Sig Start Date End Date Taking? Authorizing Provider  Albuterol Sulfate (PROVENTIL HFA IN) Inhale 2 puffs into the lungs daily as needed (shortness of breath).     [provider]  oxyCODONE-acetaminophen (PERCOCET/ROXICET) 5-325 MG tablet Take 1-2 tablets by mouth every 3 (three) hours as needed for severe pain (moderate to severe pain (when tolerating fluids)). 12/23/18   Florian Buff, MD      Allergies    Patient has no known allergies.    Review of Systems   Review of Systems  Physical Exam Updated Vital Signs BP 127/84   Pulse 74   Temp 98.3 F (36.8 C) (Oral)   Resp 17   Ht '5\' 3"'$  (1.6 m)   Wt 63.5 kg   LMP  02/24/2022 (Approximate)   SpO2 100%   BMI 24.80 kg/m  Physical Exam  ED Results / Procedures / Treatments   Labs (all labs ordered are listed, but only abnormal results are displayed) Labs Reviewed  BASIC METABOLIC PANEL  CBC  URINALYSIS, ROUTINE W REFLEX MICROSCOPIC  CBG MONITORING, ED  I-STAT BETA HCG BLOOD, ED ([facility], [facility], [facility] ONLY)    EKG EKG Interpretation  Date/Time:  Friday March 24 2022 20:29:27 EST Ventricular Rate:  78 PR Interval:  174 QRS Duration: 92 QT Interval:  359 QTC Calculation: 409 R Axis:   29 Text Interpretation: Sinus rhythm Consider right atrial enlargement Confirmed by Dene Gentry [phone]) on 03/24/2022 8:59:12 PM  Radiology No results found.  Procedures Procedures  {Document cardiac monitor, telemetry assessment procedure when appropriate:1}  Medications Ordered in ED Medications  HYDROcodone-acetaminophen (NORCO/VICODIN) 5-325 MG per tablet 1 tablet (has no administration in time range)    ED Course/ Medical Decision Making/ A&P Clinical Course as of 03/24/22 2129  Fri Mar 24, 2022  2116 [facility]: 32.8 [OZ]    Clinical Course User Index [OZ] Luvenia Heller, PA-C   {   Click here for ABCD2, HEART and other calculatorsREFRESH Note before signing :1}                          Medical Decision  Making Amount and/or Complexity of Data Reviewed Labs: ordered. Decision-making details documented in ED Course. Radiology: ordered.  Risk Prescription drug management.   This patient presents to the ED for concern of *** differential diagnosis includes ***    Additional history obtained:  Additional history obtained from *** External records from outside source obtained and reviewed including ***   Lab Tests:  I Ordered, and personally interpreted labs.  The pertinent results include:  ***   Imaging Studies ordered:  I ordered imaging studies including ***  I independently visualized and interpreted imaging which showed *** I  agree with the radiologist interpretation   Medicines ordered and prescription drug management:  I ordered medication including ***  for ***  Reevaluation of the patient after these medicines showed that the patient {resolved/improved/worsened:23923::"improved"} I have reviewed the patients home medicines and have made adjustments as needed   Problem List / ED Course:  ***   Social Determinants of Health:       {Document critical care time when appropriate:1} {Document review of labs and clinical decision tools ie heart score, Chads2Vasc2 etc:1}  {Document your independent review of radiology images, and any outside records:1} {Document your discussion with family members, caretakers, and with consultants:1} {Document social determinants of health affecting pt's care:1} {Document your decision making why or why not admission, treatments were needed:1} Final Clinical Impression(s) / ED Diagnoses Final diagnoses:  None    Rx / DC Orders ED Discharge Orders     None

## 2022-03-25 ENCOUNTER — Emergency Department (HOSPITAL_COMMUNITY): Payer: Self-pay

## 2022-03-25 DIAGNOSIS — S060X1A Concussion with loss of consciousness of 30 minutes or less, initial encounter: Secondary | ICD-10-CM

## 2022-03-25 DIAGNOSIS — R569 Unspecified convulsions: Secondary | ICD-10-CM

## 2022-03-25 LAB — RAPID URINE DRUG SCREEN, HOSP PERFORMED
Amphetamines: NOT DETECTED
Barbiturates: NOT DETECTED
Benzodiazepines: NOT DETECTED
Cocaine: NOT DETECTED
Opiates: POSITIVE — AB
Tetrahydrocannabinol: NOT DETECTED

## 2022-03-25 LAB — ETHANOL: Alcohol, Ethyl (B): <10 mg/dL (ref <10)

## 2022-03-25 MED ORDER — FENTANYL CITRATE PF 50 MCG/ML IJ SOSY
50.0000 ug | PREFILLED_SYRINGE | Freq: Once | INTRAMUSCULAR | Status: AC
  Administered 2022-03-25: 50 ug via INTRAVENOUS
  Filled 2022-03-25: qty 1

## 2022-03-25 MED ORDER — LORAZEPAM 2 MG/ML IJ SOLN: 1.0000 mg | INTRAMUSCULAR | Status: DC | PRN

## 2022-03-25 MED ORDER — ACETAMINOPHEN 650 MG RE SUPP: 650.0000 mg | Freq: Four times a day (QID) | RECTAL | Status: DC | PRN

## 2022-03-25 MED ORDER — ACETAMINOPHEN 325 MG PO TABS: 650.0000 mg | ORAL_TABLET | Freq: Four times a day (QID) | ORAL | Status: DC | PRN

## 2022-03-25 MED ORDER — LEVETIRACETAM IN NACL 1000 MG/100ML IV SOLN
1000.0000 mg | Freq: Once | INTRAVENOUS | Status: AC
  Administered 2022-03-25: 1000 mg via INTRAVENOUS
  Filled 2022-03-25: qty 100

## 2022-03-25 MED ORDER — IOHEXOL 350 MG/ML SOLN
75.0000 mL | Freq: Once | INTRAVENOUS | Status: AC | PRN
  Administered 2022-03-25: 75 mL via INTRAVENOUS

## 2022-03-25 NOTE — ED Notes (Signed)
Dr. Nicoletta Dress and this RN discussed leaving AMA with patient and the risks involved. Patient verbalized understanding, still would like to leave. Dr. Nicoletta Dress notified. Patient is ambulatory with steady gait at time of leaving the emergency department.

## 2022-03-25 NOTE — Progress Notes (Signed)
Stat  EEG complete - results pending.  

## 2022-03-25 NOTE — ED Provider Notes (Addendum)
Patient seizing post discharge from previous team,  I was coming to room and patient began seiszing and and posturing with LUE.  Immediately loaded with 2 mg of IV ativan and zofran and then 2 grams of IV keppra.    Examined:   NCAT RRR CTAB NABS  Consults:  D/w Dr. Cheral Marker by me, load with keppra and admit Dr. Cheral Marker will consult.        Leodis Alcocer, MD 03/25/22 [id number]

## 2022-03-25 NOTE — Plan of Care (Signed)
As soon as I walked into this patient 's room, this patient told me that she wanted to leave the hospital against medical advices. I assessed her and she is A+O x3, and  she is able to understand her diagnosis and consequence of leaving AMA included but not limited to worsening mental status, more seizures, worst  scenario could be coma and death. Her sister was at bedside and also wanted her to stay but the patient  insisted on leaving AMA.     Saud Bail, MD 9:24 AM

## 2022-03-25 NOTE — Procedures (Signed)
Patient Name: KYANN VOLLMER  MRN: [id number]  Epilepsy Attending: Lora Havens  Referring Physician/Provider: Kerney Elbe, MD  Date: 03/25/2022 Duration: 22.50 mins  Patient history: 42 year old female with seizures after a fall striking head. EEG to evaluate for seizure.  Level of alertness: Asleep  AEDs during EEG study: None  Technical aspects: This EEG study was done with scalp electrodes positioned according to the 10-20 International system of electrode placement. Electrical activity was reviewed with band pass filter of 1-'70Hz'$ , sensitivity of 7 uV/mm, display speed of 53m/sec with a '60Hz'$  notched filter applied as appropriate. EEG data were recorded continuously and digitally stored.  Video monitoring was available and reviewed as appropriate.  Description: Sleep was characterized by vertex waves, sleep spindles (12 to 14 Hz), maximal frontocentral region. Posterior occipital sharp transients of sleep were also noted.  Physiologic photic driving was not seen during photic stimulation.  Hyperventilation was not performed.     IMPRESSION: This study during sleep only is within normal limits. No seizures or epileptiform discharges were seen throughout the recording.  A normal interictal EEG does not exclude the diagnosis of epilepsy.   Wladyslawa Disbro OBarbra Sarks

## 2022-03-25 NOTE — ED Provider Notes (Signed)
Received at shift change from oscar zelaya [facility] please see note for full detail  Patient without significant medical history presenting after a fall, had mechanical fall on ice rink, fell hit her head, there was questionable seizure-like activity, partner informing that patient had knocked herself out and was drooling, and took about 12 minutes for her to come back to, he is understanding full body shaking, states that she has no history of seizure disorders, states that she is endorsing headaches and had some body twitching while at the hospital, no other complaints.  Patient was about to be discharged and had another seizure-like activity, full body shaking with right arm posturing, given 2 mg of Ativan, will provide her with Keppra consult with neurology. Physical Exam  BP (!) 112/93   Pulse (!) 55   Temp 98.3 F (36.8 C) (Oral)   Resp 15   Ht '5\' 3"'$  (1.6 m)   Wt 63.5 kg   LMP 02/24/2022 (Approximate)   SpO2 100%   BMI 24.80 kg/m   Physical Exam Vitals and nursing note reviewed.  Constitutional:      Comments: During my exam patient was somnolent, will respond to painful stimuli, she is maintaining her oral airway, currently on 2 L via nasal cannula.  No snoring respirations.  HENT:     Head: Normocephalic and atraumatic.     Nose: No congestion.  Eyes:     Conjunctiva/sclera: Conjunctivae normal.     Pupils: Pupils are equal, round, and reactive to light.  Cardiovascular:     Rate and Rhythm: Normal rate and regular rhythm.     Pulses: Normal pulses.     Heart sounds: No murmur heard.    No friction rub. No gallop.  Pulmonary:     Effort: No respiratory distress.     Breath sounds: No wheezing, rhonchi or rales.  Skin:    General: Skin is warm and dry.  Psychiatric:        Mood and Affect: Mood normal.     Procedures  Procedures  ED Course / MDM   Clinical Course as of 03/25/22 0611  Fri Mar 24, 2022  2[facility]: 32.8 [OZ]    Clinical Course User Index [OZ]  Luvenia Heller, PA-C   Medical Decision Making Amount and/or Complexity of Data Reviewed Labs: ordered. Decision-making details documented in ED Course. Radiology: ordered.  Risk Prescription drug management. Decision regarding hospitalization.     Lab Tests:  I Ordered, and personally interpreted labs.  The pertinent results include: CBC is unremarkable, BMP unremarkable, UA is unremarkable, i-STAT hCG negative   Imaging Studies ordered:  I ordered imaging studies including CT head, C-spine, x-ray of the right humerus forearm shoulder,  I independently visualized and interpreted imaging which showed all negative for significant findings I agree with the radiologist interpretation   Cardiac Monitoring:  The patient was maintained on a cardiac monitor.  I personally viewed and interpreted the cardiac monitored which showed an underlying rhythm of: EKG without signs of ischemia   Medicines ordered and prescription drug management:  I ordered medication including Keppra, Ativan I have reviewed the patients home medicines and have made adjustments as needed  Critical Interventions:  N/A   Reevaluation:  Patient had seizure-like activity, patient was given Ativan, Keppra, consult was made to neurology Dr. Nicholes Stairs spoke with Dr. Cheral Marker of neurology, recommend 2 g of Keppra, admit to hospitalist repeat CTA head and neck.  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  Patient is updated on these findings she is agreement admission at this time  Consultations Obtained:  I requested consultation with Dr. Velia Meyer,  and discussed lab and imaging findings as well as pertinent plan - they recommend: Will admit the patient.    Test Considered:  N/A    Dispostion and problem list  After consideration of the diagnostic results and the patients  response to treatment, I feel that the patent would benefit from admission.  Seizure-like activity-patient will need further evaluation with EEG as well as MRI and likely outpatient follow-up with neurology. 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           Marcello Fennel, PA-C 03/25/22 [id number]    Randal Buba, April, MD 03/25/22 [phone]

## 2022-03-25 NOTE — Progress Notes (Signed)
Carryover admission to the Day Admitter.  I discussed this case with the EDP, Deno Etienne, PA.  Per these discussions:   This is a 42 year old female with no reported prior seizure history, who is being admitted for further evaluation management of suspected 2 seizures that occurred over the last day.  First such episode occurred as the patient was a skating she fell, hit her head, then had some witnessed seizure activity by her partner.  Was brought to the ED and experienced a witnessed episode of tonic-clonic activity with posturing to the right.  Received Ativan for this as well as dose of Keppra.  Neurology was consulted, patient was evaluated by Dr. Cheral Marker, Who recommended observation to hospitalist for further evaluation management of new onset seizures, he recommended MRI brain, as well as stat EEG, and also recommended no additional antiepileptic medication, including no additional Keppra, unless the patient is noted to have additional epileptic activity.  I have placed an order for observation for further evaluation and management of the above.  I have placed some additional preliminary admit orders via the adult multi-morbid admission order set. I have also ordered seizure precautions as well as prn IV Ativan for additional seizures.    Babs Bertin, DO Hospitalist

## 2022-03-25 NOTE — Consult Note (Addendum)
NEURO HOSPITALIST CONSULT NOTE   Requesting physician: Dr. Randal Buba  Reason for Consult: Seizures after a fall striking head  History obtained from:  EDP, Boyfriend and Chart     HPI:                                                                                                                                          Shannon Dyer is an 42 y.o. female with a PMHx of migraines, panic attacks, tobacco abuse and tubo-ovarian abscess who presented to the ED after falling and striking her head at a roller-skating rink. Boyfriend witnessed the fall and relates the history. She had fallen from a standing position striking the left side of her forehead on a hardwood floor. She lost consciousness with the fall and a few seconds afterwards started to exhibit snoring respirations as though asleep, which boyfriend states "looks like the snoring that happens after a knockout punch". At that time some bloody drool was noted to be leaking from her mouth. She then awoke after about 10 minutes and was conversant and able to answer orientation questions posed by EMS, but "still a bit groggy" per boyfriend. She had a basic trauma evaluation in in the ED and was deemed ready for discharge. As she was being wheeled out of the room, she abruptly lost consciousness with upper extremities posturing symmetrically in a flexed position and then exhibited shaking of her arms and upper body that lasted for about 30 seconds. No foaming at the mouth or tongue biting was noted at that time. After the seizure, she was postictally somnolent with further depressed LOC after administration of IV Ativan. Per RN, the spell appeared physiological, most consistent with a generalized tonic clonic seizure. She has no prior history of seizures.     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 She has a 3.50 pack-year smoking history. She has never used smokeless tobacco. She reports current alcohol use. She reports that she does not use drugs.  No Known Allergies  HOME MEDICATIONS:  No current facility-administered medications on file prior to encounter.   Current Outpatient Medications on File Prior to Encounter  Medication Sig Dispense Refill   Albuterol Sulfate (PROVENTIL HFA IN) Inhale 2 puffs into the lungs daily as needed (shortness of breath).      oxyCODONE-acetaminophen (PERCOCET/ROXICET) 5-325 MG tablet Take 1-2 tablets by mouth every 3 (three) hours as needed for severe pain (moderate to severe pain (when tolerating fluids)). 16 tablet 0     ROS:                                                                                                                                       Unable to obtain due to postictal somnolence and sedation with Ativan.    Blood pressure 118/81, pulse 84, temperature 98.3 F (36.8 C), temperature source Oral, resp. rate 19, height '5\' 3"'$  (1.6 m), weight 63.5 kg, last menstrual period 02/24/2022, SpO2 99 %.   General Examination:                                                                                                       Physical Exam  HEENT-  Normocephalic. There is a shallow/broad based lump on the side of her left forehead.    Lungs- Respirations unlabored Extremities- No edema   Neurological Examination Mental Status: Somnolent. Will flail arms semipurposefully and murmur  incoherently to noxious stimuli. Does not follow any commands or answer to her voice.  Cranial Nerves: II: PERRL. Subsequently closes eyes and moves head away when examiner attempts to assess blink to threat.  III,IV, VI: Eyes are conjugate at the midline without nystagmus.  V,VII: Face is symmetric at rest and during brief verbal output following noxious stimulation VIII: Unable to assess IX,X: Gag reflex deferred XI: Head is at the midline.  XII: Did not follow command for assessment Motor/Sensory: Briefly thrashes BUE after about 10 seconds of light persistent pinching, without asymmetry.  Withdraws BLE briskly after about 5 seconds of noxious plantar stimulation, without asymmetry.  Deep Tendon Reflexes: 2+ bilateral brachioradialis and patellae. Toes mute bilaterally.  Cerebellar/Gait: Unable to assess Other: No jerking, twitching, posturing, tonic eye deviation or other clinical seizure-like activity appreciated.     Lab Results: Basic Metabolic Panel: Recent Labs  Lab 03/24/22 2028  NA 135  K 3.8  CL 102  CO2 23  GLUCOSE 84  BUN 10  CREATININE 0.95  CALCIUM 9.5    CBC: Recent Labs  Lab 03/24/22 2028  WBC 7.3  HGB 13.7  HCT 41.3  MCV 98.8  PLT 262    Cardiac Enzymes: No results for input(s): "CKTOTAL", "CKMB", "CKMBINDEX", "TROPONINI" in the last 168 hours.  Lipid Panel: No results for input(s): "CHOL", "TRIG", "HDL", "CHOLHDL", "VLDL", "LDLCALC" in the last 168 hours.  Imaging: CT Head Wo Contrast  Result Date: 03/24/2022 CLINICAL DATA:  Moderate to severe head and neck trauma EXAM: CT HEAD WITHOUT CONTRAST CT CERVICAL SPINE WITHOUT CONTRAST TECHNIQUE: Multidetector CT imaging of the head and cervical spine was performed following the standard protocol without intravenous contrast. Multiplanar CT image reconstructions of the cervical spine were also generated. RADIATION DOSE REDUCTION: This exam was performed according to the departmental dose-optimization  program which includes automated exposure control, adjustment of the mA and/or kV according to patient size and/or use of iterative reconstruction technique. COMPARISON:  CT head 10/02/2017 and cervical spine radiographs 05/27/2011 FINDINGS: CT HEAD FINDINGS Brain: No evidence of acute infarction, hemorrhage, hydrocephalus, extra-axial collection or mass lesion/mass effect. Vascular: No hyperdense vessel or unexpected calcification. Focal calcification along the right foramen of Luschka. Skull: No fracture or focal lesion. Small hematoma within the left frontal scalp. Sinuses/Orbits: No acute finding. Other: None. CT CERVICAL SPINE FINDINGS Alignment: No evidence of traumatic malalignment. Skull base and vertebrae: No acute fracture. No primary bone lesion or focal pathologic process. Soft tissues and spinal canal: No prevertebral fluid or swelling. No visible canal hematoma. Disc levels: Disc space height is maintained. No significant spinal canal or neural foraminal narrowing. Upper chest: Negative. Other: None. IMPRESSION: 1. No acute intracranial abnormality. 2. No acute fracture in the cervical spine. 3. Small hematoma within the left frontal scalp. Electronically Signed   By: Placido Sou M.D.   On: 03/24/2022 22:39   CT Cervical Spine Wo Contrast  Result Date: 03/24/2022 CLINICAL DATA:  Moderate to severe head and neck trauma EXAM: CT HEAD WITHOUT CONTRAST CT CERVICAL SPINE WITHOUT CONTRAST TECHNIQUE: Multidetector CT imaging of the head and cervical spine was performed following the standard protocol without intravenous contrast. Multiplanar CT image reconstructions of the cervical spine were also generated. RADIATION DOSE REDUCTION: This exam was performed according to the departmental dose-optimization program which includes automated exposure control, adjustment of the mA and/or kV according to patient size and/or use of iterative reconstruction technique. COMPARISON:  CT head 10/02/2017 and  cervical spine radiographs 05/27/2011 FINDINGS: CT HEAD FINDINGS Brain: No evidence of acute infarction, hemorrhage, hydrocephalus, extra-axial collection or mass lesion/mass effect. Vascular: No hyperdense vessel or unexpected calcification. Focal calcification along the right foramen of Luschka. Skull: No fracture or focal lesion. Small hematoma within the left frontal scalp. Sinuses/Orbits: No acute finding. Other: None. CT CERVICAL SPINE FINDINGS Alignment: No evidence of traumatic malalignment. Skull base and vertebrae: No acute fracture. No primary bone lesion or focal pathologic process. Soft tissues and spinal canal: No prevertebral fluid or swelling. No visible canal hematoma. Disc levels: Disc space height is maintained. No significant spinal canal or neural foraminal narrowing. Upper chest: Negative. Other: None. IMPRESSION: 1. No acute intracranial abnormality. 2. No acute fracture in the cervical spine. 3. Small hematoma within the left frontal scalp. Electronically Signed   By: Placido Sou M.D.   On: 03/24/2022 22:39   DG Forearm Right  Result Date: 03/24/2022 CLINICAL DATA:  Fall roller-skating. EXAM: RIGHT FOREARM - 2 VIEW COMPARISON:  None Available. FINDINGS: Normal bone mineralization. No acute fracture within the radius or ulna. Normal alignment at the elbow and wrist. No elbow  joint effusion is seen. IMPRESSION: No acute fracture. Electronically Signed   By: Yvonne Kendall M.D.   On: 03/24/2022 21:52   DG Humerus Right  Result Date: 03/24/2022 CLINICAL DATA:  Fall when roller skating. EXAM: RIGHT HUMERUS - 2+ VIEW COMPARISON:  None Available. FINDINGS: Normal bone mineralization. No acute fracture is seen within the right humerus. The shoulder and elbow appear normally aligned. IMPRESSION: Normal right humerus radiographs . Electronically Signed   By: Yvonne Kendall M.D.   On: 03/24/2022 21:51   DG Shoulder Right  Result Date: 03/24/2022 CLINICAL DATA:  Fall when roller skating.  EXAM: RIGHT SHOULDER - 2+ VIEW COMPARISON:  None Available. FINDINGS: Normal bone mineralization. Joint spaces are preserved. No acute fracture or dislocation. The visualized portion of the right lung is unremarkable. IMPRESSION: Normal right shoulder radiographs. Electronically Signed   By: Yvonne Kendall M.D.   On: 03/24/2022 21:50     Assessment: 42 year old female with seizures after a fall striking head - Exam reveals postictal lethargy in conjunction with effects of Ativan sedation.  - CT head is negative for brain contusion, ICH or other acute intracranial abnormality. No acute fracture in the cervical spine. A small hematoma within the left frontal scalp is noted. A punctate focus of hyperdensity in the right anteromedial cerebellum is noted on personal review of the images that is not mentioned in the official report; this may represent artifact or punctate acute hemorrhage secondary to traumatic shear injury.  - DDx:  - Most likely etiololgy for her presentation is felt to be concussive convulsions, which present with seizure-like activity but are non-epileptic.  - May also have sustained a brain contusion in her fall, which would predispose to further seizures. In that event, she most likely she will not need long-term anticonvulsant therapy.   Recommendations: - STAT EEG (ordered) - MRI brain to rule out cerebral contusion as well as possible punctate right cerebellar hemorrhage.  - Agree with loading Keppra 1000 mg IV x 2 to stabilize given recurrent seizure-like activity in the ED here. Hold off on further anticonvulsant unless she has recurrent seizure or if MRI/EEG reveal focal epileptogenic lesion - Inpatient seizure precautions - Post-concussion precautions  Addendum: Preliminary review of EEG reveals no electrographic seizure or epileptiform discharges.     Electronically signed: Dr. Kerney Elbe 03/25/2022, 12:33 AM

## 2022-03-27 ENCOUNTER — Telehealth: Payer: Self-pay

## 2022-03-27 NOTE — Transitions of Care (Post Inpatient/ED Visit) (Signed)
   03/27/2022  Name: ZYVA ZINS MRN: [id number] DOB: 11/14/1980  Today's TOC FU Call Status: Today's TOC FU Call Status:: Successful TOC FU Call Competed TOC FU Call Complete Date: 03/27/22  Transition Care Management Follow-up Telephone Call Date of Discharge: 03/25/22 Discharge Facility: [facility]) Type of Discharge: Emergency Department Primary Inpatient Discharge Diagnosis:: Seizure How have you been since you were released from the hospital?: Better Any questions or concerns?: No  Items Reviewed: Did you receive and understand the discharge instructions provided?: Yes Medications obtained and verified?: Yes (Medications Reviewed) Any new allergies since your discharge?: No Dietary orders reviewed?: NA Do you have support at home?: Yes People in Home: significant other  Home Care and Equipment/Supplies: [place] Ordered?: No Any new equipment or medical supplies ordered?: No  Functional Questionnaire: Do you need assistance with bathing/showering or dressing?: No Do you need assistance with meal preparation?: No Do you need assistance with eating?: No Do you have difficulty maintaining continence: No Do you need assistance with getting out of bed/getting out of a chair/moving?: No Do you have difficulty managing or taking your medications?: No  Folllow up appointments reviewed: PCP Follow-up appointment confirmed?: No (declined) MD Provider Line Number:[phone] Given: Yes [facility] Follow-up appointment confirmed?: NA Do you need transportation to your follow-up appointment?: No Do you understand care options if your condition(s) worsen?: Yes-patient verbalized understanding    [place] LPN [place] [phone]

## 2022-04-10 ENCOUNTER — Telehealth: Payer: Self-pay

## 2022-04-10 NOTE — Transitions of Care (Post Inpatient/ED Visit) (Unsigned)
   04/10/2022  Name: Shannon Dyer MRN: [id number] DOB: September 04, 1980  Today's TOC FU Call Status: Today's TOC FU Call Status:: Unsuccessul Call (1st Attempt) Unsuccessful Call (1st Attempt) Date: 04/10/22  Attempted to reach the patient regarding the most recent Inpatient/ED visit.  Follow Up Plan: Additional outreach attempts will be made to reach the patient to complete the Transitions of Care (Post Inpatient/ED visit) call.   [place] LPN [place] Advisor Direct Dial [phone]

## 2022-04-11 NOTE — Transitions of Care (Post Inpatient/ED Visit) (Signed)
   04/11/2022  Name: Shannon Dyer MRN: [id number] DOB: 1981/01/07  Today's TOC FU Call Status: Today's TOC FU Call Status:: Unsuccessful Call (2nd Attempt) Unsuccessful Call (1st Attempt) Date: 04/10/22 Unsuccessful Call (2nd Attempt) Date: 04/11/22  Attempted to reach the patient regarding the most recent Inpatient/ED visit.  Follow Up Plan: Additional outreach attempts will be made to reach the patient to complete the Transitions of Care (Post Inpatient/ED visit) call.   Signature Francella Solian, CMA

## 2022-04-13 ENCOUNTER — Ambulatory Visit (INDEPENDENT_AMBULATORY_CARE_PROVIDER_SITE_OTHER): Payer: Self-pay | Admitting: Family Medicine

## 2022-04-13 ENCOUNTER — Encounter: Payer: Self-pay | Admitting: Family Medicine

## 2022-04-13 VITALS — BP 98/70 | HR 92 | Temp 97.6°F | Ht 61.5 in | Wt 143.4 lb

## 2022-04-13 DIAGNOSIS — G4700 Insomnia, unspecified: Secondary | ICD-10-CM | POA: Insufficient documentation

## 2022-04-13 DIAGNOSIS — F0781 Postconcussional syndrome: Secondary | ICD-10-CM | POA: Insufficient documentation

## 2022-04-13 DIAGNOSIS — R569 Unspecified convulsions: Secondary | ICD-10-CM

## 2022-04-13 MED ORDER — IMIPRAMINE HCL 50 MG PO TABS: 50.0000 mg | ORAL_TABLET | Freq: Every day | ORAL | 0 refills | Status: DC

## 2022-04-13 NOTE — Assessment & Plan Note (Signed)
Acute associated with recent concussion.  No further seizure activity since day of concussion.  Encouraged her to keep follow-up with neurology for further evaluation

## 2022-04-13 NOTE — Assessment & Plan Note (Signed)
Acute, likely secondary to recent concussion.  She is already on Lyrica 150 mg p.o. daily but states this does not make her feel tired.  I encouraged her to hold off on gabapentin use given it is not effectual for her its intended use for pain control right arm and instead we will start imipramine 50 mg at bedtime for sleep and neuropathic pain.

## 2022-04-13 NOTE — Progress Notes (Signed)
Patient ID: Shannon Dyer, female    DOB: [birth year], 42 y.o.   MRN: [id number]  This visit was conducted in person.  BP 98/70   Pulse 92   Temp 97.6 F (36.4 C) (Temporal)   Ht 5' 1.5" (1.562 m)   Wt 143 lb 6 oz (65 kg)   LMP 04/10/2022 (Approximate)   SpO2 97%   BMI 26.65 kg/m    CC:  Chief Complaint  Patient presents with   Insomnia    Thinks it is stemmed from her concussion   Concussion    Fell-Seen in ER on 04/06/22    Subjective:   HPI: Shannon Dyer is a 42 y.o. female patient of Dr. Alba Cory presenting on 04/13/2022 for Insomnia (Thinks it is stemmed from her concussion) and Concussion (Fell-Seen in ER on 04/06/22)  She reports concussion March 24, 2022. ER note reviewed in detail She had a mechanical fall on an ice rink and hit her head.  There was questionable seizure-like activity and syncope. CT head largely unremarkable except for triangular lesion right IVC.   She was about to be discharged from the ER and had another seizure-like activity with full body shaking right arm posturing.  She was treated with Ativan and loaded with Keppra.  She was sent for follow-up with neurology.  Return to ED on March 7 with inguinal strain and persistent headaches.  Given Prescription for methacarbamol to use prn.   Today 04/13/22 She reports continued headache daily.  No further seizure issue.   4-5 days after concussion she started having trouble sleeping.  Trouble falling alssep and staying asleep at night.  SCAT today 13/22 symptoms.. dizziness, light sensitivity, headache, irritablity.   She has been using goody's powder 2 times daily for headaches.   She states she has been trying to do brain rest, not at work.   She is taking gabapentin or lyrica  chronically for  complex regional pain syndrome of right arm.  Taking lyrica 150 mg daily, using gabapentin 300 mg prn rarely ( she feels it does not help)  Relevant past medical, surgical, family and  social history reviewed and updated as indicated. Interim medical history since our last visit reviewed. Allergies and medications reviewed and updated. Outpatient Medications Prior to Visit  Medication Sig Dispense Refill   gabapentin (NEURONTIN) 300 MG capsule Take 300 mg by mouth daily as needed.     HYDROcodone-acetaminophen (NORCO/VICODIN) 5-325 MG tablet Take 1 tablet by mouth every 6 (six) hours as needed.     pregabalin (LYRICA) 150 MG capsule Take 150 mg by mouth daily.     tiZANidine (ZANAFLEX) 4 MG capsule Take 4 mg by mouth daily as needed.     Albuterol Sulfate (PROVENTIL HFA IN) Inhale 2 puffs into the lungs daily as needed (shortness of breath).      oxyCODONE-acetaminophen (PERCOCET/ROXICET) 5-325 MG tablet Take 1-2 tablets by mouth every 3 (three) hours as needed for severe pain (moderate to severe pain (when tolerating fluids)). 16 tablet 0   No facility-administered medications prior to visit.     Per HPI unless specifically indicated in ROS section below Review of Systems  Constitutional:  Positive for fatigue.  Neurological:  Positive for headaches.  Psychiatric/Behavioral:  Positive for sleep disturbance. Negative for agitation. The patient is not nervous/anxious.    Objective:  BP 98/70   Pulse 92   Temp 97.6 F (36.4 C) (Temporal)   Ht 5' 1.5" (1.562 m)   Wt  143 lb 6 oz (65 kg)   LMP 04/10/2022 (Approximate)   SpO2 97%   BMI 26.65 kg/m   Wt Readings from Last 3 Encounters:  04/13/22 143 lb 6 oz (65 kg)  03/24/22 140 lb (63.5 kg)  09/13/20 155 lb 7 oz (70.5 kg)      Physical Exam Constitutional:      General: She is not in acute distress.    Appearance: Normal appearance. She is well-developed. She is not ill-appearing or toxic-appearing.  HENT:     Head: Normocephalic.     Right Ear: Hearing, tympanic membrane, ear canal and external ear normal. Tympanic membrane is not erythematous, retracted or bulging.     Left Ear: Hearing, tympanic membrane,  ear canal and external ear normal. Tympanic membrane is not erythematous, retracted or bulging.     Nose: No mucosal edema or rhinorrhea.     Right Sinus: No maxillary sinus tenderness or frontal sinus tenderness.     Left Sinus: No maxillary sinus tenderness or frontal sinus tenderness.     Mouth/Throat:     Pharynx: Uvula midline.  Eyes:     General: Lids are normal. Lids are everted, no foreign bodies appreciated.     Conjunctiva/sclera: Conjunctivae normal.     Pupils: Pupils are equal, round, and reactive to light.  Neck:     Thyroid: No thyroid mass or thyromegaly.     Vascular: No carotid bruit.     Trachea: Trachea normal.  Cardiovascular:     Rate and Rhythm: Normal rate and regular rhythm.     Pulses: Normal pulses.     Heart sounds: Normal heart sounds, S1 normal and S2 normal. No murmur heard.    No friction rub. No gallop.  Pulmonary:     Effort: Pulmonary effort is normal. No tachypnea or respiratory distress.     Breath sounds: Normal breath sounds. No decreased breath sounds, wheezing, rhonchi or rales.  Abdominal:     General: Bowel sounds are normal.     Palpations: Abdomen is soft.     Tenderness: There is no abdominal tenderness.  Musculoskeletal:     Cervical back: Normal range of motion and neck supple.  Skin:    General: Skin is warm and dry.     Findings: No rash.  Neurological:     Mental Status: She is alert and oriented to person, place, and time.     GCS: GCS eye subscore is 4. GCS verbal subscore is 5. GCS motor subscore is 6.     Cranial Nerves: No cranial nerve deficit.     Sensory: No sensory deficit.     Motor: No abnormal muscle tone.     Coordination: Coordination normal.     Gait: Gait normal.     Deep Tendon Reflexes: Reflexes are normal and symmetric.     Comments: Nml cerebellar exam   No papilledema  Psychiatric:        Mood and Affect: Mood is not anxious or depressed.        Speech: Speech normal.        Behavior: Behavior  normal. Behavior is cooperative.        Thought Content: Thought content normal.        Cognition and Memory: Memory is not impaired. She does not exhibit impaired recent memory or impaired remote memory.        Judgment: Judgment normal.       Results for orders placed or performed during  the hospital encounter of [id number]  Basic metabolic panel  Result Value Ref Range   Sodium 135 135 - 145 mmol/L   Potassium 3.8 3.5 - 5.1 mmol/L   Chloride 102 98 - 111 mmol/L   CO2 23 22 - 32 mmol/L   Glucose, Bld 84 70 - 99 mg/dL   BUN 10 6 - 20 mg/dL   Creatinine, Ser 0.95 0.44 - 1.00 mg/dL   Calcium 9.5 8.9 - 10.3 mg/dL   GFR, Estimated >60 >60 mL/min   Anion gap 10 5 - 15  CBC  Result Value Ref Range   WBC 7.3 4.0 - 10.5 K/uL   RBC 4.18 3.87 - 5.11 MIL/uL   Hemoglobin 13.7 12.0 - 15.0 g/dL   HCT 41.3 36.0 - 46.0 %   MCV 98.8 80.0 - 100.0 fL   MCH 32.8 26.0 - 34.0 pg   MCHC 33.2 30.0 - 36.0 g/dL   RDW 13.1 11.5 - 15.5 %   Platelets 262 150 - 400 K/uL   nRBC 0.0 0.0 - 0.2 %  Urinalysis, Routine w reflex microscopic -Urine, Clean Catch  Result Value Ref Range   Color, Urine STRAW (A) YELLOW   APPearance CLEAR CLEAR   Specific Gravity, Urine 1.006 1.005 - 1.030   pH 5.0 5.0 - 8.0   Glucose, UA NEGATIVE NEGATIVE mg/dL   Hgb urine dipstick NEGATIVE NEGATIVE   Bilirubin Urine NEGATIVE NEGATIVE   Ketones, ur NEGATIVE NEGATIVE mg/dL   Protein, ur NEGATIVE NEGATIVE mg/dL   Nitrite NEGATIVE NEGATIVE   Leukocytes,Ua NEGATIVE NEGATIVE  Ethanol  Result Value Ref Range   Alcohol, Ethyl (B) <10 <10 mg/dL  Rapid urine drug screen (hospital performed)  Result Value Ref Range   Opiates POSITIVE (A) NONE DETECTED   Cocaine NONE DETECTED NONE DETECTED   Benzodiazepines NONE DETECTED NONE DETECTED   Amphetamines NONE DETECTED NONE DETECTED   Tetrahydrocannabinol NONE DETECTED NONE DETECTED   Barbiturates NONE DETECTED NONE DETECTED  CBG monitoring, ED  Result Value Ref Range    Glucose-Capillary 89 70 - 99 mg/dL  I-Stat beta hCG blood, ED  Result Value Ref Range   I-stat hCG, quantitative <5.0 <5 mIU/mL   Comment 3            Assessment and Plan  Post concussion syndrome Assessment & Plan: Acute, some improvement of headache, no red flags today.  She is using Goody's powder twice daily for headache which we discussed is an increased risk for stomach ulcers and rebound headache.  I have encouraged her to decrease the use of this hopefully if we improve headaches and sleep with imipramine.   Acute insomnia Assessment & Plan: Acute, likely secondary to recent concussion.  She is already on Lyrica 150 mg p.o. daily but states this does not make her feel tired.  I encouraged her to hold off on gabapentin use given it is not effectual for her its intended use for pain control right arm and instead we will start imipramine 50 mg at bedtime for sleep and neuropathic pain.   Seizures ([place]) Assessment & Plan: Acute associated with recent concussion.  No further seizure activity since day of concussion.  Encouraged her to keep follow-up with neurology for further evaluation   Other orders -     Imipramine HCl; Take 1 tablet (50 mg total) by mouth at bedtime.  Dispense: 30 tablet; Refill: 0    No follow-ups on file.   Eliezer Lofts, MD

## 2022-04-13 NOTE — Patient Instructions (Addendum)
Start imipramine at bedtime.  Hold gabapentin. Can continue your usually lyrica.  Call to schedule an appointment as soon as possible for a visit with Janace Litten, MD. Specialty: Neurology Contact information: [id number] [place] [address] [phone]

## 2022-04-13 NOTE — Assessment & Plan Note (Signed)
Acute, some improvement of headache, no red flags today.  She is using Goody's powder twice daily for headache which we discussed is an increased risk for stomach ulcers and rebound headache.  I have encouraged her to decrease the use of this hopefully if we improve headaches and sleep with imipramine.

## 2022-05-05 ENCOUNTER — Other Ambulatory Visit: Payer: Self-pay | Admitting: Family Medicine

## 2022-05-05 NOTE — Telephone Encounter (Signed)
Pharmacy is requesting a 90 day supply?  Refill?

## 2022-05-28 ENCOUNTER — Encounter: Payer: Self-pay | Admitting: Emergency Medicine

## 2022-05-28 ENCOUNTER — Ambulatory Visit
Admission: EM | Admit: 2022-05-28 | Discharge: 2022-05-28 | Disposition: A | Discharge status: Home / Self Care | Payer: Self-pay | Attending: Family Medicine | Admitting: Family Medicine

## 2022-05-28 ENCOUNTER — Ambulatory Visit (INDEPENDENT_AMBULATORY_CARE_PROVIDER_SITE_OTHER): Payer: Self-pay

## 2022-05-28 DIAGNOSIS — M549 Dorsalgia, unspecified: Secondary | ICD-10-CM

## 2022-05-28 DIAGNOSIS — M5440 Lumbago with sciatica, unspecified side: Secondary | ICD-10-CM

## 2022-05-28 MED ORDER — PREDNISONE 10 MG (21) PO TBPK: ORAL_TABLET | Freq: Every day | ORAL | 0 refills | Status: DC

## 2022-05-28 NOTE — ED Triage Notes (Signed)
Woke up on Friday morning w/ pain to lower back   Pain is stabbing and sharp OTC ibuprofen 600mg  at 0500 ( no relief) Icey hot topical and warm tub soaks Denies injury Denies any cysts

## 2022-05-28 NOTE — Discharge Instructions (Addendum)
Advised patient to take medication as directed with food to completion.  Encouraged to increase daily water intake to 64 ounces per day while taking this medication.  Advised if symptoms worsen and/or unresolved please follow-up with PCP or neurosurgery/back specialist for further evaluation.

## 2022-05-28 NOTE — ED Provider Notes (Signed)
[facility]    CSN: [id number] Arrival date & time: 05/28/22  1243      History   Chief Complaint Chief Complaint  Patient presents with   Back Pain    Lower     HPI Shannon Dyer is a 42 y.o. female.   HPI 42 year old female presents with sharp painful stabbing back pain on right side since Friday.  PMH significant for complex regional pain syndrome I of right upper limb, postconcussion syndrome, tobacco abuse, and panic attacks.  Past Medical History:  Diagnosis Date   Migraines    Panic attacks    TOA (tubo-ovarian abscess) 12/2018   Tobacco abuse     Patient Active Problem List   Diagnosis Date Noted   Post concussion syndrome 04/13/2022   Acute insomnia 04/13/2022   Seizures ([facility]) 03/25/2022   Complex regional pain syndrome i of right upper limb 09/13/2020   TOA (tubo-ovarian abscess), right 12/20/2018   BV (bacterial vaginosis) 12/20/2018   Periumbilical abdominal pain 12/19/2018   Migraines    Neck strain, initial encounter 11/27/2017   Headache 09/24/2017   Dizziness 09/24/2017   Skin lesion 09/24/2017   Smoker 08/11/2016   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   Dyspepsia 02/14/2016   Myalgia 11/21/2012   Encounter for routine gynecological examination 05/22/2012   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 problem 05/22/2012   BACK PAIN 02/16/2010   ANXIETY DEPRESSION 06/30/2008   GANGLION CYST, WRIST, LEFT 07/25/2007    Past Surgical History:  Procedure Laterality Date   cystic ovarian mass  01/2005   ELBOW LIGAMENT RECONSTRUCTION      OB History     Gravida  0   Para      Term      Preterm      AB      Living         SAB      IAB      Ectopic      Multiple      Live Births               Home Medications    Prior to Admission medications   Medication Sig Start Date End Date Taking? Authorizing Provider  predniSONE (STERAPRED UNI-PAK 21 TAB) 10 MG (21) TBPK tablet Take by mouth daily. Take 6 tabs by mouth daily  for 2  days, then 5 tabs for 2 days, then 4 tabs for 2 days, then 3 tabs for 2 days, 2 tabs for 2 days, then 1 tab by mouth daily for 2 days 05/28/22  Yes Trevor Iha, FNP  gabapentin (NEURONTIN) 300 MG capsule Take 300 mg by mouth daily as needed. Patient not taking: Reported on 05/28/2022 03/26/22   [provider]  imipramine (TOFRANIL) 50 MG tablet TAKE 1 TABLET BY MOUTH EVERYDAY AT BEDTIME Patient not taking: Reported on 05/28/2022 05/05/22   Excell Seltzer, MD  pregabalin (LYRICA) 150 MG capsule Take 150 mg by mouth daily. 02/23/22   [provider]  tiZANidine (ZANAFLEX) 4 MG capsule Take 4 mg by mouth daily as needed. Patient not taking: Reported on 05/28/2022    [provider]    Family History Family History  Problem Relation Age of Onset   Hypertension Father 9       died of unknown causes-autopsy   COPD Father    Asthma Sister    Breast cancer Neg Hx     Social History Social History   Tobacco Use   Smoking  status: Every Day    Packs/day: 0.50    Years: 7.00    Additional pack years: 0.00    Total pack years: 3.50    Types: Cigarettes, E-cigarettes    Last attempt to quit: 03/31/2022    Years since quitting: 0.1   Smokeless tobacco: Never  Vaping Use   Vaping Use: Every day   Substances: Nicotine, Flavoring  Substance Use Topics   Alcohol use: Yes    Alcohol/week: 0.0 standard drinks of alcohol    Comment: occasionally   Drug use: No     Allergies   Patient has no known allergies.   Review of Systems Review of Systems  Musculoskeletal:  Positive for back pain.  All other systems reviewed and are negative.    Physical Exam Triage Vital Signs ED Triage Vitals  Enc Vitals Group     BP 05/28/22 1255 120/85     Pulse Rate 05/28/22 1255 71     Resp 05/28/22 1255 16     Temp 05/28/22 1255 98.5 F (36.9 C)     Temp Source 05/28/22 1255 Oral     SpO2 05/28/22 1255 99 %     Weight 05/28/22 1256 140 lb (63.5 kg)     Height --       Head Circumference --      Peak Flow --      Pain Score 05/28/22 1255 9     Pain Loc --      Pain Edu? --      Excl. in GC? --    No data found.  Updated Vital Signs BP 120/85 (BP Location: Left Arm)   Pulse 71   Temp 98.5 F (36.9 C) (Oral)   Resp 16   Wt 140 lb (63.5 kg)   LMP 05/13/2022 (Exact Date)   SpO2 99%   BMI 26.02 kg/m    Physical Exam Vitals and nursing note reviewed.  Constitutional:      Appearance: Normal appearance. She is obese.  HENT:     Head: Normocephalic and atraumatic.     Nose: Nose normal.     Mouth/Throat:     Mouth: Mucous membranes are moist.     Pharynx: Oropharynx is clear.  Eyes:     Extraocular Movements: Extraocular movements intact.     Conjunctiva/sclera: Conjunctivae normal.     Pupils: Pupils are equal, round, and reactive to light.  Cardiovascular:     Rate and Rhythm: Normal rate and regular rhythm.     Pulses: Normal pulses.     Heart sounds: Normal heart sounds.  Pulmonary:     Effort: Pulmonary effort is normal.     Breath sounds: Normal breath sounds. No wheezing, rhonchi or rales.  Musculoskeletal:        General: Normal range of motion.     Cervical back: Normal range of motion and neck supple.     Comments: Lumbar spine (inferior central aspects): TTP with patient reporting radiating pain to her tailbone  Skin:    General: Skin is warm and dry.  Neurological:     General: No focal deficit present.     Mental Status: She is alert and oriented to person, place, and time. Mental status is at baseline.      UC Treatments / Results  Labs (all labs ordered are listed, but only abnormal results are displayed) Labs Reviewed - No data to display  EKG   Radiology DG Lumbar Spine Complete  Result Date: 05/28/2022  CLINICAL DATA:  Radiating low back pain for 4 days. No known injury. 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 There are 6 non-rib-bearing lumbar vertebra, lumbarized S1. Stable lumbar alignment. Small disc space at L5 and the lumbarized S1. Mild disc space narrowing and spurring at L4-L5 with mild facet hypertrophy, similar. No fracture or acute radiographic abnormality. IMPRESSION: 1. No acute findings. 2. Mild degenerative disc disease and facet hypertrophy at L4-L5. 3. 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 Electronically Signed   By: Narda Rutherford M.D.   On: 05/28/2022 13:36    Procedures Procedures (including critical care time)  Medications Ordered in UC Medications - No data to display  Initial Impression / Assessment and Plan / UC Course  I have reviewed the triage vital signs and the nursing notes.  Pertinent labs & imaging results that were available during my care of the patient were reviewed by me and considered in my medical decision making (see chart for details).     MDM: 1.  Acute bilateral low back pain with sciatica, sciatica laterality unspecified-LS-spine x-ray revealed above, patient notified of results with hardcopy provided, Rx'd Sterapred Unipak (tapering from 60 mg to 10 mg for the next 10 days). Advised patient to take medication as directed with food to completion.  Encouraged to increase daily water intake to 64 ounces per day while taking this medication.  Advised if symptoms worsen and/or unresolved please follow-up with PCP or neurosurgery/back specialist for further evaluation.  Patient discharged home, hemodynamically stable.  Final Clinical Impressions(s) / UC Diagnoses   Final diagnoses:  Acute bilateral low back pain with sciatica, sciatica laterality unspecified     Discharge Instructions      Advised patient to take medication as directed with food to completion.  Encouraged to increase daily water intake to 64 ounces per day while taking this medication.   Advised if symptoms worsen and/or unresolved please follow-up with PCP or neurosurgery/back specialist for further evaluation.     ED Prescriptions     Medication Sig Dispense Auth. Provider   predniSONE (STERAPRED UNI-PAK 21 TAB) 10 MG (21) TBPK tablet Take by mouth daily. Take 6 tabs by mouth daily  for 2 days, then 5 tabs for 2 days, then 4 tabs for 2 days, then 3 tabs for 2 days, 2 tabs for 2 days, then 1 tab by mouth daily for 2 days 42 tablet Trevor Iha, FNP      I have reviewed the PDMP during this encounter.   Trevor Iha, FNP 05/28/22 1408

## 2022-07-16 ENCOUNTER — Other Ambulatory Visit: Payer: Self-pay | Admitting: Family Medicine

## 2022-07-17 NOTE — Telephone Encounter (Signed)
Pt hasn't seen PCP since 2022, please schedule CPE (labs prior) or at least a med refill and then route back to me

## 2022-07-17 NOTE — Telephone Encounter (Signed)
Patient scheduled for med refill 6/24

## 2022-07-17 NOTE — Telephone Encounter (Signed)
To soon to fill now but will discuss at f/u appt

## 2022-07-24 ENCOUNTER — Encounter: Payer: Self-pay | Admitting: Family Medicine

## 2022-07-24 ENCOUNTER — Ambulatory Visit (INDEPENDENT_AMBULATORY_CARE_PROVIDER_SITE_OTHER): Payer: Self-pay | Admitting: Family Medicine

## 2022-07-24 ENCOUNTER — Telehealth: Payer: Self-pay | Admitting: Family Medicine

## 2022-07-24 ENCOUNTER — Telehealth: Payer: Self-pay | Admitting: *Deleted

## 2022-07-24 VITALS — BP 102/64 | HR 79 | Temp 97.7°F | Ht 61.5 in | Wt 153.1 lb

## 2022-07-24 DIAGNOSIS — Z87898 Personal history of other specified conditions: Secondary | ICD-10-CM | POA: Insufficient documentation

## 2022-07-24 DIAGNOSIS — G4700 Insomnia, unspecified: Secondary | ICD-10-CM

## 2022-07-24 DIAGNOSIS — F341 Dysthymic disorder: Secondary | ICD-10-CM

## 2022-07-24 DIAGNOSIS — R001 Bradycardia, unspecified: Secondary | ICD-10-CM | POA: Insufficient documentation

## 2022-07-24 DIAGNOSIS — G43009 Migraine without aura, not intractable, without status migrainosus: Secondary | ICD-10-CM

## 2022-07-24 DIAGNOSIS — F0781 Postconcussional syndrome: Secondary | ICD-10-CM

## 2022-07-24 DIAGNOSIS — R569 Unspecified convulsions: Secondary | ICD-10-CM

## 2022-07-24 NOTE — Telephone Encounter (Signed)
I did not refill the imipramine (previously from Dr Ermalene Searing for sleep) because she told me it did not work at all   If she is having neck pain /tightness then a muscle relaxer may help both that and sleep  Does she want a refill of the tizanadine she has used before or try something new?

## 2022-07-24 NOTE — Progress Notes (Signed)
Subjective:    Patient ID: Shannon Dyer, female    DOB: 07/03/1980, 42 y.o.   MRN: [id number]  HPI  Wt Readings from Last 3 Encounters:  07/24/22 153 lb 2 oz (69.5 kg)  05/28/22 140 lb (63.5 kg)  04/13/22 143 lb 6 oz (65 kg)   28.46 kg/m  Vitals:   07/24/22 1023  BP: 102/64  Pulse: 79  Temp: 97.7 F (36.5 C)  SpO2: 97%   Pt presents for follow up of multiple problems   Complex regional pain syndrome / neuropathic pain (from right elbow injury) Had a concussion with seizure in march- followed by insomnia and headache  Per pt she fainted and then hit her head (was not a slip and fall)   Is going to have 4th surgery on the right elbow with Dr Orlan Leavens  The nerve pain is bad   Pt saw Dr Ermalene Searing in march and was started imipramine for pain and sleep  She did observe brain rest   Had a migraine recently went to ER  Migraines are more frequent since concussion  More dizzy spells   Her pulse gets below 40 at times  Happens when on couch watching TV  Before elbow injury - was athletic - running every day  Gained some weight since   Still cannot sleep  Once she gets to sleep then cannot stay asleep  Lyrica 150 mg daily- takes in am   Gabapentin -takes infrequently for breakthrough pain   No longer taking imipramine - did not help    History of anxiety and depression  Tries to control this herself when she can  Has OCD tendency  Had failed multiple medicines    Today neck is sore on left - think she slept in a funny position last night     Is not seeing psychiatry or neuro      07/24/2022   11:14 AM 09/13/2020    3:12 PM 08/11/2016    4:53 PM 02/24/2016    2:49 PM  Depression screen PHQ 2/9  Decreased Interest 0 2 0 3  Down, Depressed, Hopeless 0 3 0 0  PHQ - 2 Score 0 5 0 3  Altered sleeping 3 3 3 3   Tired, decreased energy 2 2 2  0  Change in appetite 0 2 1 0  Feeling bad or failure about yourself  0 3 0 0  Trouble concentrating 0 2 0 0   Moving slowly or fidgety/restless 0 2 0 0  Suicidal thoughts 0 0 0 0  PHQ-9 Score 5 19 6 6   Difficult doing work/chores Somewhat difficult Very difficult        EKG today is normal /reassuring with rate in 60s   Patient Active Problem List   Diagnosis Date Noted   History of syncope 07/24/2022   Bradycardia 07/24/2022   Post concussion syndrome 04/13/2022   Acute insomnia 04/13/2022   Seizures (HCC) 03/25/2022   Complex regional pain syndrome i of right upper limb 09/13/2020   TOA (tubo-ovarian abscess), right 12/20/2018   BV (bacterial vaginosis) 12/20/2018   Periumbilical abdominal pain 12/19/2018   Migraines    Neck strain, initial encounter 11/27/2017   Headache 09/24/2017   Dizziness 09/24/2017   Skin lesion 09/24/2017   Smoker 08/11/2016   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   Dyspepsia 02/14/2016   Myalgia 11/21/2012   Encounter for routine gynecological examination 05/22/2012   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 problem 05/22/2012   BACK PAIN 02/16/2010   ANXIETY  DEPRESSION 06/30/2008   GANGLION CYST, WRIST, LEFT 07/25/2007   Past Medical History:  Diagnosis Date   Migraines    Panic attacks    TOA (tubo-ovarian abscess) 12/2018   Tobacco abuse    Past Surgical History:  Procedure Laterality Date   cystic ovarian mass  01/2005   ELBOW LIGAMENT RECONSTRUCTION     Social History   Tobacco Use   Smoking status: Every Day    Types: E-cigarettes   Smokeless tobacco: Never  Vaping Use   Vaping Use: Every day   Substances: Nicotine, Flavoring  Substance Use Topics   Alcohol use: Yes    Alcohol/week: 0.0 standard drinks of alcohol    Comment: occasionally   Drug use: No   Family History  Problem Relation Age of Onset   Hypertension Father 24       died of unknown causes-autopsy   COPD Father    Asthma Sister    Breast cancer Neg Hx    No Known Allergies Current Outpatient Medications on File Prior to Visit  Medication Sig Dispense Refill   gabapentin  (NEURONTIN) 300 MG capsule Take 300 mg by mouth daily as needed.     pregabalin (LYRICA) 150 MG capsule Take 150 mg by mouth daily.     tiZANidine (ZANAFLEX) 4 MG capsule Take 4 mg by mouth daily as needed.     No current facility-administered medications on file prior to visit.    Review of Systems  Constitutional:  Positive for fatigue. Negative for activity change, appetite change, fever and unexpected weight change.  HENT:  Negative for congestion, ear pain, rhinorrhea, sinus pressure and sore throat.   Eyes:  Negative for pain, redness and visual disturbance.  Respiratory:  Negative for cough, shortness of breath and wheezing.   Cardiovascular:  Negative for chest pain and palpitations.       Low HR  Gastrointestinal:  Negative for abdominal pain, blood in stool, constipation and diarrhea.  Endocrine: Negative for polydipsia and polyuria.  Genitourinary:  Negative for dysuria, frequency and urgency.  Musculoskeletal:  Negative for arthralgias, back pain and myalgias.  Skin:  Negative for pallor and rash.  Allergic/Immunologic: Negative for environmental allergies.  Neurological:  Positive for dizziness, syncope and headaches. Negative for tremors, facial asymmetry, speech difficulty and weakness.       No further seizures  Hematological:  Negative for adenopathy. Does not bruise/bleed easily.  Psychiatric/Behavioral:  Positive for sleep disturbance. Negative for decreased concentration and dysphoric mood. The patient is nervous/anxious.        Objective:   Physical Exam Constitutional:      General: She is not in acute distress.    Appearance: Normal appearance. She is well-developed and normal weight. She is not ill-appearing or diaphoretic.  HENT:     Head: Normocephalic and atraumatic.     Right Ear: External ear normal.     Left Ear: External ear normal.     Nose: Nose normal.     Mouth/Throat:     Pharynx: No oropharyngeal exudate.  Eyes:     General: No scleral  icterus.       Right eye: No discharge.        Left eye: No discharge.     Conjunctiva/sclera: Conjunctivae normal.     Pupils: Pupils are equal, round, and reactive to light.     Comments: No nystagmus  Neck:     Thyroid: No thyromegaly.     Vascular: No carotid bruit or  JVD.     Trachea: No tracheal deviation.  Cardiovascular:     Rate and Rhythm: Normal rate and regular rhythm.     Heart sounds: Normal heart sounds. No murmur heard. Pulmonary:     Effort: Pulmonary effort is normal. No respiratory distress.     Breath sounds: Normal breath sounds. No wheezing or rales.  Abdominal:     General: Bowel sounds are normal. There is no distension.     Palpations: Abdomen is soft. There is no mass.     Tenderness: There is no abdominal tenderness.  Musculoskeletal:        General: No tenderness.     Cervical back: Full passive range of motion without pain, normal range of motion and neck supple.     Comments: Limited rom right elbow Right medial fingers are splinted    Lymphadenopathy:     Cervical: No cervical adenopathy.  Skin:    General: Skin is warm and dry.     Coloration: Skin is not pale.     Findings: No rash.  Neurological:     Mental Status: She is alert and oriented to person, place, and time.     Cranial Nerves: No cranial nerve deficit, dysarthria or facial asymmetry.     Sensory: No sensory deficit.     Motor: No weakness, tremor, atrophy, abnormal muscle tone, seizure activity or pronator drift.     Coordination: Romberg sign negative. Coordination normal.     Gait: Gait is intact. Gait normal.     Deep Tendon Reflexes: Reflexes are normal and symmetric.     Comments: No focal cerebellar signs   Psychiatric:        Behavior: Behavior normal.        Thought Content: Thought content normal.           Assessment & Plan:   Problem List Items Addressed This Visit       Cardiovascular and Mediastinum   Migraines    Worsened recently after concussion         Nervous and Auditory   Post concussion syndrome - Primary    Ongoing worse migraine type migraines  Reviewed notes from ER and Dr Ermalene Searing along with brain and neck imaging  Has obs brain rest initially  Taking lyrica for her elbow pain (? May benefit from bid instructed of every day and will d/w her hand docotr Referral done to neuro for this and also syncope and insomnia         Other   Seizures (HCC)    Pt had a seizure with head injury and one shortly thereafter None since   Some post concussion symptoms Ref to neuro made       History of syncope    Per pt fall that lead to concussion was caused by syncope (not the other way around)  Also pulse rate (per fitness watch) goes below 40 at times Some dizziness and headaches Records reviewed today from ER incl imaging and also EKG was reassuring  Ref to neuro and cardiology      Relevant Orders   EKG 12-Lead (Completed)   Bradycardia    Per pt - HR goes below 40 when sitting at times and her fitness watch alerts her  Today normal EKG rate in 60s History of syncope   Ref done to cardiology      Relevant Orders   EKG 12-Lead (Completed)   ANXIETY DEPRESSION    With insomnia  Has  had eventful year-working on referrals for syncope and concussion Will need to re visit this and needs psychiatry (has failed many meds) Most recently imipramine did not help mood or sleep       Acute insomnia    Imipramine did not help  Ref to neuro for this and concussion/dizziness/syncope  Also anxiety is issue

## 2022-07-24 NOTE — Assessment & Plan Note (Signed)
Pt had a seizure with head injury and one shortly thereafter None since   Some post concussion symptoms Ref to neuro made

## 2022-07-24 NOTE — Telephone Encounter (Signed)
Patient contacted the office regarding appointment from today 6/24, states she spoke with Dr.Tower about refilling medications for sleep and asked if she could get a muscle relaxer to help with neck pain she is experiencing. Patient wanted to follow up to see if these meds had been sent in yet. Please advise, thank you.

## 2022-07-24 NOTE — Telephone Encounter (Signed)
While doing EKG pt asked if PCP was going to send in a sleep med as requested at appt.  [facility] [place], [place]

## 2022-07-24 NOTE — Assessment & Plan Note (Signed)
Imipramine did not help  Ref to neuro for this and concussion/dizziness/syncope  Also anxiety is issue

## 2022-07-24 NOTE — Assessment & Plan Note (Signed)
Per pt - HR goes below 40 when sitting at times and her fitness watch alerts her  Today normal EKG rate in 60s History of syncope   Ref done to cardiology

## 2022-07-24 NOTE — Assessment & Plan Note (Signed)
With insomnia  Has had eventful year-working on referrals for syncope and concussion Will need to re visit this and needs psychiatry (has failed many meds) Most recently imipramine did not help mood or sleep

## 2022-07-24 NOTE — Assessment & Plan Note (Signed)
Worsened recently after concussion

## 2022-07-24 NOTE — Assessment & Plan Note (Signed)
Ongoing worse migraine type migraines  Reviewed notes from ER and Dr Ermalene Searing along with brain and neck imaging  Has obs brain rest initially  Taking lyrica for her elbow pain (? May benefit from bid instructed of every day and will d/w her hand docotr Referral done to neuro for this and also syncope and insomnia

## 2022-07-24 NOTE — Patient Instructions (Signed)
Keep drinking fluids   Watch your pulse   EKG today   I want to send you to cardiology for the low pulse and dizziness and fainting   I want to send you to neurology for headache/ concussion syndrome, dizziness and fainting   If you don't hear about both referrals in the next week let us know   Talk to your ortho about considering lyrica twice daily

## 2022-07-24 NOTE — Assessment & Plan Note (Signed)
Per pt fall that lead to concussion was caused by syncope (not the other way around)  Also pulse rate (per fitness watch) goes below 40 at times Some dizziness and headaches Records reviewed today from ER incl imaging and also EKG was reassuring  Ref to neuro and cardiology

## 2022-07-25 MED ORDER — CYCLOBENZAPRINE HCL 10 MG PO TABS: 10.0000 mg | ORAL_TABLET | Freq: Every evening | ORAL | 3 refills | Status: DC | PRN

## 2022-07-25 NOTE — Telephone Encounter (Signed)
Patient called to follow up on this request, I relayed message from Dr. Milinda Antis. She said she would like to try something new, says she has tizanadine on hand and it is not helping. States she was in pain last night and she had a migraine along with pain in her neck.

## 2022-07-25 NOTE — Telephone Encounter (Signed)
Pt advised of med recommendations. 

## 2022-07-25 NOTE — Telephone Encounter (Signed)
Please further advise. 

## 2022-07-25 NOTE — Telephone Encounter (Signed)
I sent in flexeril to try  This tends to be more potent and sedating - try it at bedtime and hopefully it will help both muscle tightness and sleep and headache  Keep me posted, hope it helps

## 2022-07-26 NOTE — Telephone Encounter (Signed)
See sperate phone note

## 2022-07-31 ENCOUNTER — Telehealth: Payer: Self-pay | Admitting: Family Medicine

## 2022-07-31 MED ORDER — TRAZODONE HCL 100 MG PO TABS: 100.0000 mg | ORAL_TABLET | Freq: Every day | ORAL | 1 refills | Status: DC

## 2022-07-31 NOTE — Telephone Encounter (Signed)
My last recommendation for sleep and headache is trazodone -I think she has tried it before - does she remember how she did?  It puts folks in a more natural sleep than most other medicines Let me know if she wants to try it again  I will have to make sure it does not interact with other medicines   Otherwise we will rely on neurology to help [place] out   Also - feel free to reach out to your hand doctor before appointment to ask about the lyrica

## 2022-07-31 NOTE — Telephone Encounter (Signed)
Pt called back requesting a update on the message she sent Dr. Milinda Antis earlier today. Told pt Dr. Royden Purl response. Pt states she did discuss the med increase with her hand doctor, but she doesn't visit him until 7/11. So, the change in her meds won't occur until she sees him, if he is going to increase the meds. Pt asked again would another rx be prescribed to help her out? Call back # [phone]

## 2022-07-31 NOTE — Telephone Encounter (Signed)
I sent it  Hope it helps See neuro as planned Keep [place] posted

## 2022-07-31 NOTE — Telephone Encounter (Signed)
Pt notified of Dr. Royden Purl comments and instructions.   Pt said she did fine on the trazodone and is ok trying it again.   [facility]

## 2022-07-31 NOTE — Telephone Encounter (Signed)
So glad you are seeing neuro on 7/3- they will be more helpful than me   Did you ever talk with your hand doctor about increasing lyrica from once daily to twice daily ?   -it is a thought I had that may not only help arm but headaches  (If you could tolerate it)

## 2022-07-31 NOTE — Addendum Note (Signed)
Addended by: Roxy Manns A on: 07/31/2022 05:12 PM   Modules accepted: Orders

## 2022-07-31 NOTE — Telephone Encounter (Signed)
Pt called to let Dr. Milinda Antis know that the meds, cyclobenzaprine (FLEXERIL) 10 MG tablet [[id number]] aren't working. Pt asked does she need to schedule another appt or could another rx be prescribed for a different meds? Pt also wanted to let Dr. Milinda Antis know that she has a appt with the neurologist on Wed, 7/3. Call back # [phone]

## 2022-08-02 ENCOUNTER — Ambulatory Visit (INDEPENDENT_AMBULATORY_CARE_PROVIDER_SITE_OTHER): Payer: Self-pay | Admitting: Neurology

## 2022-08-02 ENCOUNTER — Encounter: Payer: Self-pay | Admitting: Neurology

## 2022-08-02 VITALS — Ht 63.0 in | Wt 151.2 lb

## 2022-08-02 DIAGNOSIS — F0781 Postconcussional syndrome: Secondary | ICD-10-CM

## 2022-08-02 DIAGNOSIS — G44321 Chronic post-traumatic headache, intractable: Secondary | ICD-10-CM

## 2022-08-02 DIAGNOSIS — R001 Bradycardia, unspecified: Secondary | ICD-10-CM

## 2022-08-02 MED ORDER — VERAPAMIL HCL ER 120 MG PO TBCR: 120.0000 mg | EXTENDED_RELEASE_TABLET | Freq: Every day | ORAL | 3 refills | Status: AC

## 2022-08-02 NOTE — Progress Notes (Signed)
Patient of pain clinic at [facility] and her referral was to Neurologist is Shannon Levee, MD. Specialty: Neurology Contact information: [address] [place] [address] [phone] . She never made contact.    [facility]  Provider:  Dr Clinton Wahlberg Referring Provider: Milinda Antis, Audrie Gallus, MD Primary Care Physician:  Tower, Audrie Gallus, MD  Chief Complaint  Patient presents with   New Patient (Initial Visit)    Patient in room #2 with Kathlene November.  Patient states she has dizziness and headaches left side of her head. Patient states she felt lite headed while sitting up from laying down and standing up from sitting.    HPI:  Shannon Dyer is a 42 y.o. female and seen here upon referral from Dr. Milinda Antis for a Consultation/ Evaluation of a "seizure " following a fall and head injury, suffered a concussion with hematoma left scalp.  Skating rink fall in ? 2024.  She was " dismissed " meaning released from the first ED and moved to be seen at  [facility]  after three MDs reportedly didn't find anything at [facility].  CT reportedly normal, reviewed today.   EEG was performed after a presumed seizure was witnessed by RN according to Dr Nicanor Alcon.  Patient  was given Ativan and Keppra IV  then EEG was recorded.  Epilepsy Attending: Charlsie Quest  Referring Physician/Provider: Caryl Pina, MD  Date: 03/25/2022 Duration: 22.50 mins  Patient history: 42 year old female with seizures after a fall striking head. EEG to evaluate for seizure. Level of alertness: Asleep   AEDs during EEG study: None  Description: Sleep was characterized by vertex waves, sleep spindles (12 to 14 Hz), maximal frontocentral region. Posterior occipital sharp transients of sleep were also noted.  Physiologic photic driving was not seen during photic stimulation.  Hyperventilation was not performed.      IMPRESSION: This study during sleep only is within normal limits. No seizures  or epileptiform discharges were seen throughout the recording.  A normal interictal EEG does not exclude the diagnosis of epilepsy.    Charlsie Quest   CT : IMPRESSION: 1. No acute intracranial abnormality. 2. No acute fracture in the cervical spine. 3. Small hematoma within the left frontal scalp.     By: Minerva Fester M.D.   On: 03/24/2022 22:39    Her husband never believed she was seizing, he noted snoring and saliva in her mouth.  Her headaches post concussion are the problem now.  No seizure - no syncope, she fell on an ice rink (!).  Neck pain and headaches have  been worsening.  Left temporal pain, left frontal head pain, pulsating.  Left eye photophobia. Some Phonophobia. Blurred vision  some days.  She sometimes has dizziness with headaches, following the HA or preceding the onset of HA.  No aura , no flushing. No balance problems, no vertigo.   Reports tingling in lower face, now resolved.  She just feels sleepy after these headaches.   Sometimes she wakes up with headaches , sometimes is woken up by ice pick headaches.   No Nausea !    Review of Systems: Out of a complete 14 system review, the patient complains of only the following symptoms, and all other reviewed systems are negative.  Epworth Sleepiness score: 11/ 24.  Fatigued 25/ 63    Headaches - post concussion-   Social History   Socioeconomic History   Marital status: Divorced  Spouse name: Reita Cliche   Number of children: 0   Years of education: Not on file   Highest education level: 12th grade  Occupational History   Occupation: Event organiser: [employer]  Tobacco Use   Smoking status: Every Day    Types: E-cigarettes   Smokeless tobacco: Never  Vaping Use   Vaping Use: Every day   Substances: Nicotine, Flavoring  Substance and Sexual Activity   Alcohol use: Yes    Alcohol/week: 0.0 standard drinks of alcohol    Comment: occasionally   Drug use: No   Sexual activity: Yes     Birth control/protection: None  Other Topics Concern   Not on file  Social History Narrative   Patient is right-handed, though states she can use either. She lives wirth her husband in a 1 story house, 5 steps to enter. She drinks I cup of coffee and 1 soda a day. She walks several miles a day a work.      Patient is currently separated from her husband. She lives in a 2 level home now. She drinks one cup of coffee and one soda a day.   Social Determinants of Health   Financial Resource Strain: Not on file  Food Insecurity: Not on file  Transportation Needs: Not on file  Physical Activity: Not on file  Stress: Not on file  Social Connections: Not on file  Intimate Partner Violence: Not on file    Family History  Problem Relation Age of Onset   Hypertension Father 39       died of unknown causes-autopsy   COPD Father    Asthma Sister    Breast cancer Neg Hx     Past Medical History:  Diagnosis Date   Migraines    Panic attacks    TOA (tubo-ovarian abscess) 12/2018   Tobacco abuse     Past Surgical History:  Procedure Laterality Date   cystic ovarian mass  01/2005   ELBOW LIGAMENT RECONSTRUCTION      Current Outpatient Medications  Medication Sig Dispense Refill   cyclobenzaprine (FLEXERIL) 10 MG tablet Take 1 tablet (10 mg total) by mouth at bedtime as needed for muscle spasms (and sleep). Caution of sedation 30 tablet 3   gabapentin (NEURONTIN) 300 MG capsule Take 300 mg by mouth daily as needed.     pregabalin (LYRICA) 150 MG capsule Take 150 mg by mouth daily.     traZODone (DESYREL) 100 MG tablet Take 1 tablet (100 mg total) by mouth at bedtime. 90 tablet 1   No current facility-administered medications for this visit.    Allergies as of 08/02/2022   (No Known Allergies)    Vitals: Ht 5\' 3"  (1.6 m)   Wt 151 lb 3.2 oz (68.6 kg)   LMP 07/12/2022   BMI 26.78 kg/m  Last Weight:  Wt Readings from Last 1 Encounters:  08/02/22 151 lb 3.2 oz (68.6 kg)    Last Height:   Ht Readings from Last 1 Encounters:  08/02/22 5\' 3"  (1.6 m)   Last BMI: @LASTBMI  Physical exam:  General: The patient is awake, alert and appears not in acute distress.  The patient is well groomed. Head: Normocephalic, atraumatic.  Neck is supple.  Neck circumference:  Cardiovascular:  Regular rate and palpable peripheral pulse:  Respiratory: clear to auscultation.  Mallampati 1, piercing in the tongue.  Skin:  Without evidence of edema, or rash Trunk: has normal posture.   Neurologic exam : The patient  is awake and alert, oriented to place and time.  Memory subjective  described as intact.  There is a normal attention span & concentration ability.  Speech is fluent without  dysarthria, dysphonia or aphasia.  Mood and affect are demure,   Cranial nerves: Pupils are equal and briskly reactive to light. Funduscopic exam without  evidence of pallor or edema. Extraocular movements  in vertical and horizontal planes intact and without nystagmus. Visual fields by finger perimetry are intact. Bilateral ptosis.  Hearing to finger rub intact.  Facial sensation intact to fine touch. Facial motor strength is symmetric and tongue and uvula move midline.  Motor exam:   Normal tone and normal muscle bulk and symmetric normal strength in all extremities. Grip Strength was weaker on the right, but muscle bulk and tone were equal to the left  Proximal strength of shoulder muscles and hip flexors was equal .  Sensory:  Fine touch and vibration were tested , she reports hyperpathia in the right arm and hand. . Proprioception was tested in the upper extremities only and was  normal.  Coordination: Rapid alternating movements in the fingers/hands were slowed  Finger-to-nose maneuver was tested and showed no evidence of ataxia, dysmetria or tremor.  Gait and station: Patient walked without assistive device  Core Strength within normal limits.  Deep tendon reflexes: in the  upper  and lower extremities are symmetric and  Brisk.  Assessment: Total time for face to face interview and examination, for review of  images and laboratory testing, neurophysiology testing and pre-existing records, including out-of -network , was 45 minutes. Assessment is as follows here:  1)  No evidence of  seizure - post concussive state, she was never out " she screamed her husbands name " and was not without awareness.  Knew her name.  No memory of the fall.  She had involuntary movements of the right arm while  being awake-  The patient has been heavily sedated for her EEG. No intracranial bleed.   2) post-concussive headaches in a pain primed patient . She has been referred to pain clinic and Psychiatry as well as to [facility] before.     Plan:  Treatment plan and additional workup planned after today includes:   1)  Repeat EEG, Repeat imaging in form of MRI brain.  2) no need to continue any seizure medication.  No spells of seizure like activity since the fall, February.   3) treat with Verapamil, at night - verapamil will help with cluster prevention.   RV with NP in 6 months.    I will not resume any other care such as chronic pain care.     Melvyn Novas, MD

## 2022-08-08 ENCOUNTER — Telehealth: Payer: Self-pay | Admitting: Neurology

## 2022-08-08 NOTE — Telephone Encounter (Signed)
Pt stated she has been taking verapamil (CALAN-SR) 120 MG CR tablet.,Pt states for the last two day medication has made her sick to stomach.

## 2022-08-08 NOTE — Telephone Encounter (Signed)
Called the patient back. She states since starting the verapamil she has been sick to her stomach, vomitting and states that her dizziness has worsened. She doesn't have a way of checking her blood pressure at home so unsure if the medication is causing her BP to dip but pt did run 111/84 to 118/84 when here at her ov on 7/3.  Advised I would reach out to Dr Vickey Huger and let her know and see what she recommends.

## 2022-08-08 NOTE — Telephone Encounter (Signed)
I spoke to Dr Vickey Huger and she states that she doesn't feel the medication is the cause for nausea and vomiting. She would recommend the patient follow up with PCP for other causes.

## 2022-08-09 NOTE — Telephone Encounter (Signed)
Called the patient back. Today she states that she was able to eat something late last night and keep it down. She feels some better this morning. She notes she doesn't feel as dizzy.  Advised the pt that Dr. Vickey Huger doesn't feel the nausea/vomiting is coming from verapamil. This could be anything and could be post concussion symptoms which can take time. Asked the patient about her headaches and she did admit while taking the med the headaches were better and she does note that she feels this is dull headache. Advised that if the headaches are starting to creep back up, she should resume the verapamil and if the symptoms occur again, we may need to consider alt medication. Pt verbalized understanding.

## 2022-08-15 ENCOUNTER — Other Ambulatory Visit: Payer: Self-pay | Admitting: Family Medicine

## 2022-08-22 ENCOUNTER — Ambulatory Visit (INDEPENDENT_AMBULATORY_CARE_PROVIDER_SITE_OTHER): Payer: Self-pay | Admitting: Neurology

## 2022-08-22 ENCOUNTER — Telehealth: Payer: Self-pay | Admitting: Neurology

## 2022-08-22 ENCOUNTER — Encounter: Payer: Self-pay | Admitting: Anesthesiology

## 2022-08-22 DIAGNOSIS — R001 Bradycardia, unspecified: Secondary | ICD-10-CM

## 2022-08-22 DIAGNOSIS — F0781 Postconcussional syndrome: Secondary | ICD-10-CM

## 2022-08-22 DIAGNOSIS — R4182 Altered mental status, unspecified: Secondary | ICD-10-CM

## 2022-08-22 DIAGNOSIS — G44321 Chronic post-traumatic headache, intractable: Secondary | ICD-10-CM

## 2022-08-22 NOTE — Telephone Encounter (Signed)
Pt stated her eye has been twitching a lot and feel she may need to be on medication.

## 2022-08-23 ENCOUNTER — Telehealth: Payer: Self-pay | Admitting: Neurology

## 2022-08-23 NOTE — Telephone Encounter (Signed)
She did not want to schedule at [facility] due to the cost. With the 62% self-pay discount, it would be $767.60 and has to pay it all when she checks in per Angie.

## 2022-08-23 NOTE — Telephone Encounter (Signed)
Pt has called back sue to being unable to access my chart.  Pt states she has noticed the eye twitching about a week ago, she is not on any new medication.  She notices the eye twitching when her anxiety gets up, please call pt to discuss.

## 2022-08-24 ENCOUNTER — Telehealth: Payer: Self-pay | Admitting: Family Medicine

## 2022-08-24 NOTE — Telephone Encounter (Signed)
Thanks for the heads up Please schedule and in person appt

## 2022-08-24 NOTE — Telephone Encounter (Signed)
Patient scheduled on 7/29 for OV

## 2022-08-24 NOTE — Telephone Encounter (Signed)
Patient called in with a message for Dr. Milinda Antis, states that she had seen her neurologist for an issue with eye twitching. Patient's neurologist told her that anxiety was likely causing this and that she should reach out to her pcp to inform them of this. Patient would like to know what to do next, make an appointment or something else? Please advise patient at mobile number.

## 2022-08-28 ENCOUNTER — Encounter: Payer: Self-pay | Admitting: Family Medicine

## 2022-08-28 ENCOUNTER — Ambulatory Visit (INDEPENDENT_AMBULATORY_CARE_PROVIDER_SITE_OTHER): Payer: Self-pay | Admitting: Family Medicine

## 2022-08-28 VITALS — BP 110/78 | HR 98 | Temp 97.7°F | Ht 63.0 in | Wt 157.0 lb

## 2022-08-28 DIAGNOSIS — G245 Blepharospasm: Secondary | ICD-10-CM

## 2022-08-28 DIAGNOSIS — F341 Dysthymic disorder: Secondary | ICD-10-CM

## 2022-08-28 DIAGNOSIS — G90511 Complex regional pain syndrome I of right upper limb: Secondary | ICD-10-CM

## 2022-08-28 DIAGNOSIS — F0781 Postconcussional syndrome: Secondary | ICD-10-CM

## 2022-08-28 MED ORDER — TRAZODONE HCL 100 MG PO TABS: 150.0000 mg | ORAL_TABLET | Freq: Every day | ORAL | 1 refills | Status: DC

## 2022-08-28 NOTE — Assessment & Plan Note (Signed)
This began after concussion and pt has seen neurology Improved but now happens during times of anxiety /mood change   Discussed longer term management of mental health incl psychiatry referral (see a/p for anx/dep)  Today had eye twitch walking in but not in exam room Reassuring exam

## 2022-08-28 NOTE — Assessment & Plan Note (Signed)
Nerve/tendon surgery planned this Wednesday On lyrica and also prn gabapentin   Anxious to get this done and over with and hopes for help with pain

## 2022-08-28 NOTE — Assessment & Plan Note (Signed)
Some improvement in headache Reviewed neurology notes Taking varapamil sr 120 mg for headache prophylaxis (cluster)

## 2022-08-28 NOTE — Patient Instructions (Addendum)
Call this number to see about getting set up with a new psychiatrist and possibly a counselor   [facility] [address] [place], [place] [address]  [phone]  I will also put in a referral    Try going up to 150 mg on the trazodone at bedtime for sleep and also for anxiety/depression   If too sedating/ or if side effects or if you feel like mood is worse instead of better let us know and go back to 100 mg   If you ever become suicidal or want to hurt yourself -go to the closest ER   Good luck with your surgery !

## 2022-08-28 NOTE — Assessment & Plan Note (Signed)
Worsened lately in setting of chronic pain and concussion  Causing intermittent right eye twitch   Discussed meds failed in past incl paxil, zoloft, remeron, wellburrin , buspar and effexor  Interested in ref to psychiatry/ will [facility] (pt has no insurance)  # given  Will call likely after her arm surgery this Wednesday   Discussed options in meantime Will go up on trazodone from 100 to 150 mg for sleep and mood Discussed possible side effects /notable sedation in comb with lyrica  Discussed expectations of this medication including time to effectiveness and mechanism of action, also poss of side effects (early and late)- including mental fuzziness, weight or appetite change, nausea and poss of worse dep or anxiety (even suicidal thoughts)  Pt voiced understanding and will stop med and update if this occurs   No SI today  Call back and Er precautions noted in detail today

## 2022-08-28 NOTE — Progress Notes (Signed)
Subjective:    Patient ID: Shannon Dyer, female    DOB: [birth year], 42 y.o.   MRN: [id number]  HPI  Wt Readings from Last 3 Encounters:  08/28/22 157 lb (71.2 kg)  08/02/22 151 lb 3.2 oz (68.6 kg)  07/24/22 153 lb 2 oz (69.5 kg)   27.81 kg/m  Vitals:   08/28/22 1109  BP: 110/78  Pulse: 98  Temp: 97.7 F (36.5 C)  SpO2: 99%   Pt presents for right eye twitch   Saw neurology provider  Thought it may be worsened by anxiety   In midst of post concussion syndrome with seizure (? Of seizure , neuro does not think this was actually the case)  Planned repeat EEG and also MRI of brain   Was prescription with verapamil for cluster headache prevention   Noted at last visit here Has history of anxiety and depression which she tries to manage herself  Also tendency towards OCD  Failed multiple medicines in the past   Recently imipramine did not help sleep or mood   In more distant past  Tried and failed multiple medications including paxil, zolft, remeron, wellbutrin, buspar and effexor   Currently takes medicine for complex regional pain  Lyrica 150 mg daily - did recently go up to twice daily (does not seem to help more)  Gabapentin prn - on and off / ordered bid prn - takes when needed for right elbow pain    Trazodone 100 mg daily at bedtime - this is for sleep  It does help some  Gets up on average twice nightly  No side effects     Headaches are improved-pleased with that   Right eye twitch This was non stop after concussion  Now is on and off  Not at night   Recently  Driving / walking dog /on scale today  Not causing pain - but is bothersome   Has been through a lot  Mood is anxious Stressed about arm surgery (this is 4th one)- this Wednesday (working on nerves)  Feels overwhelmed-like not enough time in the day to do things       08/28/2022   11:19 AM 07/24/2022   11:14 AM 09/13/2020    3:12 PM 08/11/2016    4:53 PM 02/24/2016    2:49 PM   Depression screen PHQ 2/9  Decreased Interest 0 0 2 0 3  Down, Depressed, Hopeless 2 0 3 0 0  PHQ - 2 Score 2 0 5 0 3  Altered sleeping 1 3 3 3 3   Tired, decreased energy 0 2 2 2  0  Change in appetite 1 0 2 1 0  Feeling bad or failure about yourself  0 0 3 0 0  Trouble concentrating 1 0 2 0 0  Moving slowly or fidgety/restless 2 0 2 0 0  Suicidal thoughts 0 0 0 0 0  PHQ-9 Score 7 5 19 6 6   Difficult doing work/chores Somewhat difficult Somewhat difficult Very difficult        08/28/2022   11:19 AM 07/24/2022   11:14 AM 09/13/2020    3:13 PM 08/11/2016    4:53 PM  GAD 7 : Generalized Anxiety Score  Nervous, Anxious, on Edge 3 1 3 3   Control/stop worrying 2 0 3 2  Worry too much - different things 3 1 3 1   Trouble relaxing 2 1 3 3   Restless 1 0 2 0  Easily annoyed or irritable 2 0 3 2  Afraid - awful might happen 0 0 0 0  Total GAD 7 Score 13 3 17 11   Anxiety Difficulty Somewhat difficult Not difficult at all Very difficult Somewhat difficult       Patient Active Problem List   Diagnosis Date Noted   Eye twitch 08/28/2022   History of syncope 07/24/2022   Bradycardia 07/24/2022   Post concussion syndrome 04/13/2022   Acute insomnia 04/13/2022   Seizures (HCC) 03/25/2022   Complex regional pain syndrome i of right upper limb 09/13/2020   TOA (tubo-ovarian abscess), right 12/20/2018   BV (bacterial vaginosis) 12/20/2018   Periumbilical abdominal pain 12/19/2018   Migraines    Neck strain, initial encounter 11/27/2017   Headache 09/24/2017   Dizziness 09/24/2017   Skin lesion 09/24/2017   Smoker 08/11/2016   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   Dyspepsia 02/14/2016   Myalgia 11/21/2012   Encounter for routine gynecological examination 05/22/2012   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 problem 05/22/2012   BACK PAIN 02/16/2010   ANXIETY DEPRESSION 06/30/2008   GANGLION CYST, WRIST, LEFT 07/25/2007   Past Medical History:  Diagnosis Date   Migraines    Panic attacks    TOA  (tubo-ovarian abscess) 12/2018   Tobacco abuse    Past Surgical History:  Procedure Laterality Date   cystic ovarian mass  01/2005   ELBOW LIGAMENT RECONSTRUCTION     Social History   Tobacco Use   Smoking status: Every Day    Types: E-cigarettes   Smokeless tobacco: Never  Vaping Use   Vaping status: Every Day   Substances: Nicotine, Flavoring  Substance Use Topics   Alcohol use: Yes    Alcohol/week: 0.0 standard drinks of alcohol    Comment: occasionally   Drug use: No   Family History  Problem Relation Age of Onset   Hypertension Father 54       died of unknown causes-autopsy   COPD Father    Asthma Sister    Breast cancer Neg Hx    No Known Allergies Current Outpatient Medications on File Prior to Visit  Medication Sig Dispense Refill   cyclobenzaprine (FLEXERIL) 10 MG tablet Take 1 tablet (10 mg total) by mouth at bedtime as needed for muscle spasms (and sleep). Caution of sedation 30 tablet 3   gabapentin (NEURONTIN) 300 MG capsule Take 300 mg by mouth daily as needed.     pregabalin (LYRICA) 150 MG capsule Take 150 mg by mouth daily.     verapamil (CALAN-SR) 120 MG CR tablet Take 1 tablet (120 mg total) by mouth at bedtime. 90 tablet 3   No current facility-administered medications on file prior to visit.    Review of Systems  Constitutional:  Positive for fatigue. Negative for activity change, appetite change, fever and unexpected weight change.  HENT:  Negative for congestion, ear pain, rhinorrhea, sinus pressure and sore throat.   Eyes:  Negative for pain, redness and visual disturbance.  Respiratory:  Negative for cough, shortness of breath and wheezing.   Cardiovascular:  Negative for chest pain and palpitations.  Gastrointestinal:  Negative for abdominal pain, blood in stool, constipation and diarrhea.  Endocrine: Negative for polydipsia and polyuria.  Genitourinary:  Negative for dysuria, frequency and urgency.  Musculoskeletal:  Positive for  arthralgias and myalgias. Negative for back pain and joint swelling.  Skin:  Negative for pallor and rash.  Allergic/Immunologic: Negative for environmental allergies.  Neurological:  Positive for headaches. Negative for dizziness, tremors, seizures, syncope, facial asymmetry, speech difficulty and numbness.  Right eye twitch on and off   Hematological:  Negative for adenopathy. Does not bruise/bleed easily.  Psychiatric/Behavioral:  Positive for dysphoric mood and sleep disturbance. Negative for confusion and decreased concentration. The patient is nervous/anxious.        Objective:   Physical Exam Constitutional:      General: She is not in acute distress.    Appearance: Normal appearance. She is well-developed and normal weight. She is not ill-appearing or diaphoretic.  HENT:     Head: Normocephalic and atraumatic.  Eyes:     Conjunctiva/sclera: Conjunctivae normal.     Pupils: Pupils are equal, round, and reactive to light.  Neck:     Thyroid: No thyromegaly.     Vascular: No carotid bruit or JVD.  Cardiovascular:     Rate and Rhythm: Normal rate and regular rhythm.     Heart sounds: Normal heart sounds.     No gallop.  Pulmonary:     Effort: Pulmonary effort is normal. No respiratory distress.     Breath sounds: Normal breath sounds. No wheezing or rales.  Abdominal:     General: There is no distension or abdominal bruit.     Palpations: Abdomen is soft.  Musculoskeletal:     Cervical back: Normal range of motion and neck supple.     Right lower leg: No edema.     Left lower leg: No edema.  Lymphadenopathy:     Cervical: No cervical adenopathy.  Skin:    General: Skin is warm and dry.     Coloration: Skin is not pale.     Findings: No rash.  Neurological:     Mental Status: She is alert.     Coordination: Coordination normal.     Deep Tendon Reflexes: Reflexes are normal and symmetric. Reflexes normal.  Psychiatric:        Attention and Perception: Attention  normal.        Mood and Affect: Mood normal. Affect is blunt.        Speech: Speech normal.        Behavior: Behavior is slowed.        Thought Content: Thought content does not include suicidal ideation.        Cognition and Memory: Cognition normal.     Comments: Candidly discusses symptoms and stressors             Assessment & Plan:   Problem List Items Addressed This Visit       Nervous and Auditory   Post concussion syndrome    Some improvement in headache Reviewed neurology notes Taking varapamil sr 120 mg for headache prophylaxis (cluster)      Eye twitch    This began after concussion and pt has seen neurology Improved but now happens during times of anxiety /mood change   Discussed longer term management of mental health incl psychiatry referral (see a/p for anx/dep)  Today had eye twitch walking in but not in exam room Reassuring exam      Complex regional pain syndrome i of right upper limb    Nerve/tendon surgery planned this Wednesday On lyrica and also prn gabapentin   Anxious to get this done and over with and hopes for help with pain      Relevant Medications   traZODone (DESYREL) 100 MG tablet     Other   ANXIETY DEPRESSION - Primary    Worsened lately in setting of chronic pain and concussion  Causing intermittent right  eye twitch   Discussed meds failed in past incl paxil, zoloft, remeron, wellburrin , buspar and effexor  Interested in ref to psychiatry/ will try [facility] (pt has no insurance)  # given  Will call likely after her arm surgery this Wednesday   Discussed options in meantime Will go up on trazodone from 100 to 150 mg for sleep and mood Discussed possible side effects /notable sedation in comb with lyrica  Discussed expectations of this medication including time to effectiveness and mechanism of action, also poss of side effects (early and late)- including mental fuzziness, weight or appetite change, nausea and  poss of worse dep or anxiety (even suicidal thoughts)  Pt voiced understanding and will stop med and update if this occurs   No SI today  Call back and Er precautions noted in detail today         Relevant Medications   traZODone (DESYREL) 100 MG tablet   Other Relevant Orders   Ambulatory referral to Psychiatry

## 2022-08-29 NOTE — Procedures (Signed)
    History:  42 year old woman with post concussive syndrome  EEG classification: Awake and drowsy  Description of the recording: The background rhythms of this recording consists of a fairly well modulated medium amplitude alpha rhythm of 10 Hz that is reactive to eye opening and closure. Present in the anterior head region is a 15-20 Hz beta activity. Photic stimulation was performed, did not show any abnormalities. Hyperventilation was also performed, did not show any abnormalities. Drowsiness was manifested by background fragmentation. No abnormal epileptiform discharges seen during this recording. There was no focal slowing. There were no electrographic seizure identified.   Abnormality: None   Impression: This is a normal EEG recorded while drowsy and awake. No evidence of interictal epileptiform discharges. Normal EEGs, however, do not rule out epilepsy.    Windell Norfolk, MD [facility]

## 2022-09-05 NOTE — Telephone Encounter (Signed)
Called patient to informed her that her EEG was normal. Pt verbalized understanding. Pt had no questions at this time but was encouraged to call back if questions arise.

## 2022-09-22 NOTE — Progress Notes (Deleted)
Shannon Lamb MD Reason for referral-syncope  HPI: 42 year old female for evaluation of syncope at request of morning tower MD.  Patient apparently fell at a skating rink in 2024 and has had postconcussive disorder.  Apparently has had seizures after striking her head.  Head CTA February 2024 largely normal by report though there was mention of small 2 mm saccular lesion in the distal right coronary artery felt likely to be a normal variant but outpatient neuroendovascular follow-up recommended.  EEG July 2024 normal.  Seen by neurology and treated for headaches.  Primary care felt there was possible syncope related to the fall and at times her heart rate goes below 40 by report.  We are now asked to evaluate.  Current Outpatient Medications  Medication Sig Dispense Refill   cyclobenzaprine (FLEXERIL) 10 MG tablet Take 1 tablet (10 mg total) by mouth at bedtime as needed for muscle spasms (and sleep). Caution of sedation 30 tablet 3   gabapentin (NEURONTIN) 300 MG capsule Take 300 mg by mouth daily as needed.     pregabalin (LYRICA) 150 MG capsule Take 150 mg by mouth daily.     traZODone (DESYREL) 100 MG tablet Take 1.5 tablets (150 mg total) by mouth at bedtime. 135 tablet 1   verapamil (CALAN-SR) 120 MG CR tablet Take 1 tablet (120 mg total) by mouth at bedtime. 90 tablet 3   No current facility-administered medications for this visit.    No Known Allergies   Past Medical History:  Diagnosis Date   Migraines    Panic attacks    TOA (tubo-ovarian abscess) 12/2018   Tobacco abuse     Past Surgical History:  Procedure Laterality Date   cystic ovarian mass  01/2005   ELBOW LIGAMENT RECONSTRUCTION      Social History   Socioeconomic History   Marital status: Divorced    Spouse name: Reita Cliche   Number of children: 0   Years of education: Not on file   Highest education level: 12th grade  Occupational History   Occupation: Event organiser: [employer]  Tobacco  Use   Smoking status: Every Day    Types: E-cigarettes   Smokeless tobacco: Never  Vaping Use   Vaping status: Every Day   Substances: Nicotine, Flavoring  Substance and Sexual Activity   Alcohol use: Yes    Alcohol/week: 0.0 standard drinks of alcohol    Comment: occasionally   Drug use: No   Sexual activity: Yes    Birth control/protection: None  Other Topics Concern   Not on file  Social History Narrative   Patient is right-handed, though states she can use either. She lives wirth her husband in a 1 story house, 5 steps to enter. She drinks I cup of coffee and 1 soda a day. She walks several miles a day a work.      Patient is currently separated from her husband. She lives in a 2 level home now. She drinks one cup of coffee and one soda a day.   Social Determinants of Health   Financial Resource Strain: Not on file  Food Insecurity: Not on file  Transportation Needs: Not on file  Physical Activity: Not on file  Stress: Not on file  Social Connections: Unknown (06/13/2021)   Received from [facility], [facility]   Social Network    Social Network: Not on file  Intimate Partner Violence: Low Risk  (02/22/2022)   Received from [facility]  [facility] prior to 04/01/2022.   Safety    How often does anyone, including family and friends, physically hurt you?: Never    How often does anyone, including family and friends, insult or talk down to you?: Never    How often does anyone, including family and friends, threaten you with harm?: Never    How often does anyone, including family and friends, scream or curse at you?: Never    Family History  Problem Relation Age of Onset   Hypertension Father 80       died of unknown causes-autopsy   COPD Father    Asthma Sister    Breast cancer Neg Hx     ROS: no fevers or chills, productive cough, hemoptysis, dysphasia, odynophagia, melena, hematochezia, dysuria, hematuria, rash, seizure activity, orthopnea, PND,  pedal edema, claudication. Remaining systems are negative.  Physical Exam:   There were no vitals taken for this visit.  General:  Well developed/well nourished in NAD Skin warm/dry Patient not depressed No peripheral clubbing Back-normal HEENT-normal/normal eyelids Neck supple/normal carotid upstroke bilaterally; no bruits; no JVD; no thyromegaly chest - CTA/ normal expansion CV - RRR/normal S1 and S2; no murmurs, rubs or gallops;  PMI nondisplaced Abdomen -NT/ND, no HSM, no mass, + bowel sounds, no bruit 2+ femoral pulses, no bruits Ext-no edema, chords, 2+ DP Neuro-grossly nonfocal  ECG -July 24, 2022-normal sinus rhythm with no ST changes.  Personally reviewed  A/P  1 question syncope-  2 chronic headache-  3 tobacco abuse-  Olga Millers, MD

## 2022-09-27 ENCOUNTER — Ambulatory Visit: Payer: Self-pay | Admitting: Cardiology

## 2022-10-30 ENCOUNTER — Other Ambulatory Visit: Payer: Self-pay | Admitting: Family Medicine

## 2022-10-30 NOTE — Telephone Encounter (Signed)
Prescription Request  10/30/2022  LOV: 08/28/2022  What is the name of the medication or equipment? cyclobenzaprine (FLEXERIL) 10 MG tablet   Have you contacted your pharmacy to request a refill? Yes   Which pharmacy would you like this sent to?  [id number] [address] [address], [place] - [address] [place] [address] Phone: [phone] Fax: [phone]    Patient notified that their request is being sent to the clinical staff for review and that they should receive a response within 2 business days.   Please advise at Mobile [phone] (mobile)  Pt requested a increase in dosage? Pt states she believes the meds are working but could work better if the dosage was increased.

## 2022-10-30 NOTE — Telephone Encounter (Signed)
Last filled on 07/25/22 #30 tabs/ 3 refills (? If to soon)  F/u was on 08/28/22

## 2022-10-30 NOTE — Telephone Encounter (Signed)
I changed directions to twice daily as needed  Use caution as this is sedating  Do not take more than 2 in a day    Follow up if needed

## 2022-10-30 NOTE — Telephone Encounter (Signed)
Pt said she has been taking more then one a day. The most she has taken is TID but that's rare and most days she either takes one daily or BID. Pt is out of meds

## 2022-10-30 NOTE — Telephone Encounter (Signed)
I think she should have one refill left?   Please check in with her  Thanks

## 2022-10-30 NOTE — Telephone Encounter (Signed)
Called pharmacy to get refill dates. Pt should still have pills left.  Filled on:  07/25/22 #30 08/21/22 #30 09/13/22 #30 10/06/22 #30  Called pt and no answer so left VM requesting pt to call the office back

## 2022-10-31 ENCOUNTER — Other Ambulatory Visit: Payer: Self-pay | Admitting: Family Medicine

## 2022-11-10 ENCOUNTER — Other Ambulatory Visit (HOSPITAL_COMMUNITY): Payer: Self-pay

## 2022-11-10 NOTE — Telephone Encounter (Signed)
Called pt's pharmacy, they state they only have worker's comp billing info for pt and to disregard PA request because pt pays cash and has already picked up rx.   Please sign off on rx in this encounter as PA team is unable to resolve RX requests. Thank you

## 2023-02-09 ENCOUNTER — Other Ambulatory Visit: Payer: Self-pay | Admitting: Family Medicine

## 2023-02-13 ENCOUNTER — Encounter: Payer: Self-pay | Admitting: Family Medicine

## 2023-02-13 NOTE — Telephone Encounter (Signed)
 I don't think I prescribed this before?  Also has lyrica on med list from another doctor - cannot mix the 2  Sorry I cannot fill that

## 2023-02-13 NOTE — Telephone Encounter (Signed)
 LOV - 08/28/22 NOV - not scheduled RF- in as historical on 03/26/22

## 2023-02-19 ENCOUNTER — Ambulatory Visit: Payer: Self-pay | Admitting: Family Medicine

## 2023-02-20 ENCOUNTER — Other Ambulatory Visit: Payer: Self-pay | Admitting: Family Medicine

## 2023-02-23 ENCOUNTER — Other Ambulatory Visit: Payer: Self-pay

## 2023-02-23 ENCOUNTER — Encounter: Payer: Self-pay | Admitting: Emergency Medicine

## 2023-02-23 ENCOUNTER — Ambulatory Visit
Admission: EM | Admit: 2023-02-23 | Discharge: 2023-02-23 | Disposition: A | Discharge status: Home / Self Care | Payer: Self-pay | Attending: Family Medicine | Admitting: Family Medicine

## 2023-02-23 ENCOUNTER — Ambulatory Visit: Payer: Self-pay

## 2023-02-23 DIAGNOSIS — M79671 Pain in right foot: Secondary | ICD-10-CM

## 2023-02-23 NOTE — ED Triage Notes (Signed)
Patient c/o right foot pain x 1 month, no apparent injury, tender to touch.  Patient has taken Ibuprofen w/o relief.  No history of gout.

## 2023-02-23 NOTE — Discharge Instructions (Addendum)
Advised patient of right foot x-ray results with hardcopy and images provided.  Advised patient may use OTC Ibuprofen 400 to 600 mg daily or as needed for right foot pain.  Advised if symptoms worsen and/or unresolved please follow-up with [facility] podiatry or your PCP for further evaluation.

## 2023-02-23 NOTE — ED Provider Notes (Signed)
[facility]    CSN: [id number] Arrival date & time: 02/23/23  0846      History   Chief Complaint Chief Complaint  Patient presents with   Foot Pain    HPI Shannon Dyer is a 43 y.o. female.   HPI 43 year old female presents with right foot pain for 1 month.  PMH significant for tobacco abuse, panic attacks, and migraines.  Past Medical History:  Diagnosis Date   Migraines    Panic attacks    TOA (tubo-ovarian abscess) 12/2018   Tobacco abuse     Patient Active Problem List   Diagnosis Date Noted   Eye twitch 08/28/2022   History of syncope 07/24/2022   Bradycardia 07/24/2022   Post concussion syndrome 04/13/2022   Acute insomnia 04/13/2022   Seizures (HCC) 03/25/2022   Complex regional pain syndrome i of right upper limb 09/13/2020   TOA (tubo-ovarian abscess), right 12/20/2018   BV (bacterial vaginosis) 12/20/2018   Periumbilical abdominal pain 12/19/2018   Migraines    Neck strain, initial encounter 11/27/2017   Headache 09/24/2017   Dizziness 09/24/2017   Skin lesion 09/24/2017   Smoker 08/11/2016   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   Dyspepsia 02/14/2016   Myalgia 11/21/2012   Encounter for routine gynecological examination 05/22/2012   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 problem 05/22/2012   Backache 02/16/2010   ANXIETY DEPRESSION 06/30/2008   GANGLION CYST, WRIST, LEFT 07/25/2007    Past Surgical History:  Procedure Laterality Date   cystic ovarian mass  01/2005   ELBOW LIGAMENT RECONSTRUCTION      OB History     Gravida  0   Para      Term      Preterm      AB      Living         SAB      IAB      Ectopic      Multiple      Live Births               Home Medications    Prior to Admission medications   Medication Sig Start Date End Date Taking? Authorizing Provider  pregabalin (LYRICA) 150 MG capsule Take 150 mg by mouth daily. 02/23/22  Yes [provider]  cyclobenzaprine (FLEXERIL) 10 MG tablet TAKE  1 TABLET BY MOUTH 2 TIMES DAILY AS NEEDED FOR MUSCLE SPASMS (AND SLEEP). CAUTION OF SEDATION 02/09/23   Tower, Audrie Gallus, MD  gabapentin (NEURONTIN) 300 MG capsule Take 300 mg by mouth daily as needed. 03/26/22   [provider]  traZODone (DESYREL) 100 MG tablet TAKE 1.5 TABLETS BY MOUTH AT BEDTIME. 02/20/23   Tower, Audrie Gallus, MD  verapamil (CALAN-SR) 120 MG CR tablet Take 1 tablet (120 mg total) by mouth at bedtime. 08/02/22   Dohmeier, Porfirio Mylar, MD    Family History Family History  Problem Relation Age of Onset   Healthy Mother    Hypertension Father 88       died of unknown causes-autopsy   COPD Father    Asthma Sister    Breast cancer Neg Hx     Social History Social History   Tobacco Use   Smoking status: Former    Types: E-cigarettes   Smokeless tobacco: Never  Vaping Use   Vaping status: Every Day   Substances: Nicotine, Flavoring  Substance Use Topics   Alcohol use: Yes    Alcohol/week: 0.0 standard drinks of alcohol    Comment:  occasionally   Drug use: No     Allergies   Patient has no known allergies.   Review of Systems Review of Systems   Physical Exam Triage Vital Signs ED Triage Vitals  Encounter Vitals Group     BP 02/23/23 0926 (!) 146/89     Systolic BP Percentile --      Diastolic BP Percentile --      Pulse Rate 02/23/23 0926 (!) 107     Resp 02/23/23 0926 18     Temp 02/23/23 0926 97.8 F (36.6 C)     Temp Source 02/23/23 0926 Oral     SpO2 02/23/23 0926 95 %     Weight 02/23/23 0928 140 lb (63.5 kg)     Height 02/23/23 0928 5\' 3"  (1.6 m)     Head Circumference --      Peak Flow --      Pain Score 02/23/23 0928 7     Pain Loc --      Pain Education --      Exclude from Growth Chart --    No data found.  Updated Vital Signs BP (!) 146/89 (BP Location: Left Arm)   Pulse (!) 107   Temp 97.8 F (36.6 C) (Oral)   Resp 18   Ht 5\' 3"  (1.6 m)   Wt 140 lb (63.5 kg)   LMP 02/16/2023   SpO2 95%   BMI 24.80 kg/m    Physical  Exam Vitals and nursing note reviewed.  Constitutional:      Appearance: Normal appearance. She is normal weight.  HENT:     Head: Normocephalic and atraumatic.     Mouth/Throat:     Mouth: Mucous membranes are moist.     Pharynx: Oropharynx is clear.  Eyes:     Extraocular Movements: Extraocular movements intact.     Conjunctiva/sclera: Conjunctivae normal.     Pupils: Pupils are equal, round, and reactive to light.  Cardiovascular:     Rate and Rhythm: Normal rate and regular rhythm.     Pulses: Normal pulses.     Heart sounds: Normal heart sounds.  Pulmonary:     Effort: Pulmonary effort is normal.     Breath sounds: Normal breath sounds. No wheezing, rhonchi or rales.  Musculoskeletal:        General: Normal range of motion.     Cervical back: Normal range of motion and neck supple.  Skin:    General: Skin is warm and dry.  Neurological:     General: No focal deficit present.     Mental Status: She is alert and oriented to person, place, and time. Mental status is at baseline.  Psychiatric:        Mood and Affect: Mood normal.        Behavior: Behavior normal.      UC Treatments / Results  Labs (all labs ordered are listed, but only abnormal results are displayed) Labs Reviewed - No data to display  EKG   Radiology DG Foot Complete Right Result Date: 02/23/2023 CLINICAL DATA:  Right foot pain EXAM: RIGHT FOOT COMPLETE - 3 VIEW COMPARISON:  None Available. FINDINGS: There is no evidence of fracture or dislocation. There is no evidence of arthropathy or other focal bone abnormality. Soft tissues are unremarkable. IMPRESSION: No acute abnormality Electronically Signed   By: Lorenza Cambridge M.D.   On: 02/23/2023 10:14    Procedures Procedures (including critical care time)  Medications Ordered in UC Medications -  No data to display  Initial Impression / Assessment and Plan / UC Course  I have reviewed the triage vital signs and the nursing notes.  Pertinent labs  & imaging results that were available during my care of the patient were reviewed by me and considered in my medical decision making (see chart for details).     MDM: 1.  Foot pain, right-right foot x-ray results revealed above.  Advised patient of right foot x-ray results with hardcopy and images provided.  Advised patient may use OTC Ibuprofen 400 to 600 mg daily or as needed for right foot pain.  Advised if symptoms worsen and/or unresolved please follow-up with [facility] podiatry or your PCP for further evaluation.  Patient discharged home, hemodynamically stable. Final Clinical Impressions(s) / UC Diagnoses   Final diagnoses:  Foot pain, right     Discharge Instructions      Advised patient of right foot x-ray results with hardcopy and images provided.  Advised patient may use OTC Ibuprofen 400 to 600 mg daily or as needed for right foot pain.  Advised if symptoms worsen and/or unresolved please follow-up with [facility] podiatry or your PCP for further evaluation.     ED Prescriptions   None    PDMP not reviewed this encounter.   Trevor Iha, FNP 02/23/23 1044

## 2023-03-15 ENCOUNTER — Other Ambulatory Visit: Payer: Self-pay | Admitting: Family Medicine

## 2023-03-16 NOTE — Telephone Encounter (Signed)
Last filled on 02/20/23 #45 tabs/ 0 refill, pharmacy is asking for 90 day Rx   Last OV ws 08/28/22

## 2023-05-01 ENCOUNTER — Other Ambulatory Visit: Payer: Self-pay | Admitting: Family Medicine

## 2023-05-01 NOTE — Telephone Encounter (Signed)
 Last filled on 02/09/23 #60 tabs/ 2 refills   Last OV was on 08/28/22, no future appts

## 2023-06-06 ENCOUNTER — Ambulatory Visit
Admission: EM | Admit: 2023-06-06 | Discharge: 2023-06-06 | Disposition: A | Discharge status: Home / Self Care | Payer: Self-pay | Attending: Family Medicine | Admitting: Family Medicine

## 2023-06-06 ENCOUNTER — Ambulatory Visit: Payer: Self-pay

## 2023-06-06 DIAGNOSIS — R0602 Shortness of breath: Secondary | ICD-10-CM

## 2023-06-06 DIAGNOSIS — R079 Chest pain, unspecified: Secondary | ICD-10-CM

## 2023-06-06 DIAGNOSIS — K219 Gastro-esophageal reflux disease without esophagitis: Secondary | ICD-10-CM

## 2023-06-06 DIAGNOSIS — R110 Nausea: Secondary | ICD-10-CM

## 2023-06-06 MED ORDER — LIDOCAINE VISCOUS HCL 2 % MT SOLN
15.0000 mL | Freq: Once | OROMUCOSAL | Status: AC
  Administered 2023-06-06: 15 mL via OROMUCOSAL

## 2023-06-06 MED ORDER — ALUMINUM & MAGNESIUM HYDROXIDE 200-200 MG/5ML PO SUSP
30.0000 mL | Freq: Once | ORAL | Status: AC
  Administered 2023-06-06: 30 mL via ORAL

## 2023-06-06 MED ORDER — ONDANSETRON 8 MG PO TBDP: 8.0000 mg | ORAL_TABLET | Freq: Three times a day (TID) | ORAL | 0 refills | Status: AC | PRN

## 2023-06-06 MED ORDER — OMEPRAZOLE 20 MG PO CPDR: 20.0000 mg | DELAYED_RELEASE_CAPSULE | Freq: Every day | ORAL | 0 refills | Status: DC

## 2023-06-06 MED ORDER — ONDANSETRON 8 MG PO TBDP
8.0000 mg | ORAL_TABLET | Freq: Once | ORAL | Status: AC
  Administered 2023-06-06: 8 mg via ORAL

## 2023-06-06 NOTE — ED Triage Notes (Signed)
 Pt presents to uc with co cp and sob for 3 days. Pt reports she thought it was gerd so she has been drinking low fat milk and tums with some minimal improvement but pain is worse today. Eating worsens pain, pt is nauseated. Reports diaphoretic episodes.

## 2023-06-06 NOTE — Discharge Instructions (Addendum)
 Patient advised of results of EKG and chest x-ray.  Advised patient to take medication as directed with 8 to 12 ounces of water first thing in the morning and no other medications foods or beverages for 45 minutes after taking Prilosec in order to allow medication to take effect.  Encouraged to increase daily water intake to 64 ounces per day while taking this medication.  Advised if symptoms worsen and/or unresolved please follow-up with your PCP or here for further evaluation.

## 2023-06-06 NOTE — ED Provider Notes (Signed)
 [name]    CSN: [id number] Arrival date & time: 06/06/23  1329      History   Chief Complaint Chief Complaint  Patient presents with   Chest Pain   Shortness of Breath    HPI Shannon Dyer is a 43 y.o. female.   HPI 43 year old female presents with chest pain with nausea for 3 days.  Additionally, patient reports shortness of breath and gastritis like symptoms for 3 days as well PMH significant for seizures, migraines and tobacco abuse.  Past Medical History:  Diagnosis Date   Migraines    Panic attacks    TOA (tubo-ovarian abscess) 12/2018   Tobacco abuse     Patient Active Problem List   Diagnosis Date Noted   Eye twitch 08/28/2022   History of syncope 07/24/2022   Bradycardia 07/24/2022   Post concussion syndrome 04/13/2022   Acute insomnia 04/13/2022   Seizures (HCC) 03/25/2022   Complex regional pain syndrome i of right upper limb 09/13/2020   TOA (tubo-ovarian abscess), right 12/20/2018   BV (bacterial vaginosis) 12/20/2018   Periumbilical abdominal pain 12/19/2018   Migraines    Neck strain, initial encounter 11/27/2017   Headache 09/24/2017   Dizziness 09/24/2017   Skin lesion 09/24/2017   Smoker 08/11/2016   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   Dyspepsia 02/14/2016   Myalgia 11/21/2012   Encounter for routine gynecological examination 05/22/2012   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 problem 05/22/2012   Backache 02/16/2010   ANXIETY DEPRESSION 06/30/2008   GANGLION CYST, WRIST, LEFT 07/25/2007    Past Surgical History:  Procedure Laterality Date   cystic ovarian mass  01/2005   ELBOW LIGAMENT RECONSTRUCTION      OB History     Gravida  0   Para      Term      Preterm      AB      Living         SAB      IAB      Ectopic      Multiple      Live Births               Home Medications    Prior to Admission medications   Medication Sig Start Date End Date Taking? Authorizing Provider  omeprazole (PRILOSEC) 20 MG  capsule Take 1 capsule (20 mg total) by mouth daily. 06/06/23 07/06/23 Yes Leonides Ramp, FNP  ondansetron  (ZOFRAN -ODT) 8 MG disintegrating tablet Take 1 tablet (8 mg total) by mouth every 8 (eight) hours as needed for nausea or vomiting. 06/06/23  Yes Leonides Ramp, FNP  cyclobenzaprine  (FLEXERIL ) 10 MG tablet TAKE 1 TABLET BY MOUTH 2 TIMES DAILY AS NEEDED FOR MUSCLE SPASMS (AND SLEEP). CAUTION OF SEDATION 05/01/23   Tower, Manley Seeds, MD  gabapentin  (NEURONTIN ) 300 MG capsule Take 300 mg by mouth daily as needed. 03/26/22   [provider]  pregabalin  (LYRICA ) 150 MG capsule Take 150 mg by mouth daily. 02/23/22   [provider]  traZODone  (DESYREL ) 100 MG tablet TAKE 1 AND 1/2 TABLETS BY MOUTH AT BEDTIME 03/16/23   Tower, [place] A, MD  verapamil  (CALAN -SR) 120 MG CR tablet Take 1 tablet (120 mg total) by mouth at bedtime. 08/02/22   Dohmeier, Raoul Byes, MD    Family History Family History  Problem Relation Age of Onset   Healthy Mother    Hypertension Father 58       died of unknown causes-autopsy   COPD Father  Asthma Sister    Breast cancer Neg Hx     Social History Social History   Tobacco Use   Smoking status: Former    Types: E-cigarettes   Smokeless tobacco: Never  Vaping Use   Vaping status: Every Day   Substances: Nicotine, Flavoring  Substance Use Topics   Alcohol use: Yes    Alcohol/week: 0.0 standard drinks of alcohol    Comment: occasionally   Drug use: No     Allergies   Patient has no known allergies.   Review of Systems Review of Systems  Cardiovascular:  Positive for chest pain.  Gastrointestinal:  Positive for nausea.       GERD/reflux/gastritis like symptoms for 3 days     Physical Exam Triage Vital Signs ED Triage Vitals  Encounter Vitals Group     BP 06/06/23 1336 (!) 147/110     Systolic BP Percentile --      Diastolic BP Percentile --      Pulse Rate 06/06/23 1336 (!) 116     Resp 06/06/23 1336 (!) 25     Temp 06/06/23 1336 97.7 F  (36.5 C)     Temp src --      SpO2 06/06/23 1336 100 %     Weight --      Height --      Head Circumference --      Peak Flow --      Pain Score 06/06/23 1338 10     Pain Loc --      Pain Education --      Exclude from Growth Chart --    No data found.  Updated Vital Signs BP (!) 147/110   Pulse (!) 116   Temp 97.7 F (36.5 C)   Resp (!) 25   LMP 05/23/2023   SpO2 100%    Physical Exam Vitals and nursing note reviewed.  Constitutional:      General: She is not in acute distress.    Appearance: Normal appearance. She is normal weight. She is ill-appearing. She is not toxic-appearing or diaphoretic.  HENT:     Head: Normocephalic and atraumatic.     Mouth/Throat:     Mouth: Mucous membranes are moist.     Pharynx: Oropharynx is clear.  Eyes:     Extraocular Movements: Extraocular movements intact.     Conjunctiva/sclera: Conjunctivae normal.     Pupils: Pupils are equal, round, and reactive to light.  Cardiovascular:     Rate and Rhythm: Normal rate and regular rhythm.     Pulses: Normal pulses.     Heart sounds: Normal heart sounds. No murmur heard.    No friction rub. No gallop.  Pulmonary:     Effort: Pulmonary effort is normal.     Breath sounds: Normal breath sounds. No wheezing, rhonchi or rales.     Comments: Patient complains of midsternal tenderness from upper abdomen similar to reflux-like symptoms Chest:     Chest wall: Tenderness present.  Musculoskeletal:        General: Normal range of motion.     Cervical back: Normal range of motion and neck supple.  Skin:    General: Skin is warm and dry.  Neurological:     General: No focal deficit present.     Mental Status: She is alert and oriented to person, place, and time. Mental status is at baseline.  Psychiatric:        Mood and Affect: Mood normal.  Behavior: Behavior normal.      UC Treatments / Results  Labs (all labs ordered are listed, but only abnormal results are displayed) Labs  Reviewed - No data to display  EKG   Radiology DG Chest 2 View Result Date: 06/06/2023 CLINICAL DATA:  Chest pain. EXAM: CHEST - 2 VIEW COMPARISON:  05/14/2018. FINDINGS: The heart size and mediastinal contours are within normal limits. No focal consolidation, pleural effusion, or pneumothorax. No acute osseous abnormality. IMPRESSION: No acute cardiopulmonary findings. Electronically Signed   By: Mannie Seek M.D.   On: 06/06/2023 15:01    Procedures Procedures (including critical care time)  Medications Ordered in UC Medications  aluminum-magnesium  hydroxide 200-200 MG/5ML suspension 30 mL (30 mLs Oral Given 06/06/23 1401)  lidocaine  (XYLOCAINE ) 2 % viscous mouth solution 15 mL (15 mLs Mouth/Throat Given 06/06/23 1402)  ondansetron  (ZOFRAN -ODT) disintegrating tablet 8 mg (8 mg Oral Given 06/06/23 1402)    Initial Impression / Assessment and Plan / UC Course  I have reviewed the triage vital signs and the nursing notes.  Pertinent labs & imaging results that were available during my care of the patient were reviewed by me and considered in my medical decision making (see chart for details).     MDM: 1.  Chest pain, unspecified type, EKG reveals normal sinus rhythm with sinus arrhythmia, bilateral atrial enlargement, abnormal EKG as compared to EKG of 07/20/2022, chest x-ray results revealed above, patient advised; 2.  Nausea-Zofran  8 mg disintegrating tablet given once in the clinic Rx'd Zofran  8 mg disintegrating tablet take 1 tablet every 8 hours, as needed for nausea; 3.  GERD unspecified whether esophagitis present-GI cocktail (Maalox/lidocaine ) given once and clinic, Rx'd omeprazole 20 mg capsule with specific instructions on how to take this medication.  4.  Shortness of breath-chest x-ray revealed above. Patient advised of results of EKG and chest x-ray.  Advised patient to take medication as directed with 8 to 12 ounces of water first thing in the morning and no other medications  foods or beverages for 45 minutes after taking Prilosec in order to allow medication to take effect.  Encouraged to increase daily water intake to 64 ounces per day while taking this medication.  Advised if symptoms worsen and/or unresolved please follow-up with your PCP or here for further evaluation. Final Clinical Impressions(s) / UC Diagnoses   Final diagnoses:  Chest pain, unspecified type  Nausea  Gastroesophageal reflux disease, unspecified whether esophagitis present  Shortness of breath     Discharge Instructions      Patient advised of results of EKG and chest x-ray.  Advised patient to take medication as directed with 8 to 12 ounces of water first thing in the morning and no other medications foods or beverages for 45 minutes after taking Prilosec in order to allow medication to take effect.  Encouraged to increase daily water intake to 64 ounces per day while taking this medication.  Advised if symptoms worsen and/or unresolved please follow-up with your PCP or here for further evaluation.     ED Prescriptions     Medication Sig Dispense Auth. Provider   omeprazole (PRILOSEC) 20 MG capsule Take 1 capsule (20 mg total) by mouth daily. 30 capsule Kylar Speelman, FNP   ondansetron  (ZOFRAN -ODT) 8 MG disintegrating tablet Take 1 tablet (8 mg total) by mouth every 8 (eight) hours as needed for nausea or vomiting. 24 tablet Worthy Boschert, FNP      PDMP not reviewed this encounter.   Marzetta Lanza,  FNP 06/06/23 1518

## 2023-07-04 ENCOUNTER — Encounter: Payer: Self-pay | Admitting: Emergency Medicine

## 2023-07-04 ENCOUNTER — Ambulatory Visit
Admission: EM | Admit: 2023-07-04 | Discharge: 2023-07-04 | Disposition: A | Discharge status: Home / Self Care | Payer: Self-pay | Attending: Family Medicine | Admitting: Family Medicine

## 2023-07-04 DIAGNOSIS — K219 Gastro-esophageal reflux disease without esophagitis: Secondary | ICD-10-CM

## 2023-07-04 MED ORDER — ALUM & MAG HYDROXIDE-SIMETH 200-200-20 MG/5ML PO SUSP
30.0000 mL | Freq: Once | ORAL | Status: AC
  Administered 2023-07-04: 30 mL via ORAL

## 2023-07-04 MED ORDER — ALUM & MAG HYDROXIDE-SIMETH 200-200-20 MG/5ML PO SUSP: 30.0000 mL | ORAL | Status: DC

## 2023-07-04 MED ORDER — PANTOPRAZOLE SODIUM 40 MG PO TBEC: 40.0000 mg | DELAYED_RELEASE_TABLET | Freq: Every day | ORAL | 0 refills | Status: AC

## 2023-07-04 MED ORDER — LIDOCAINE VISCOUS HCL 2 % MT SOLN
15.0000 mL | Freq: Once | OROMUCOSAL | Status: AC
  Administered 2023-07-04: 15 mL via ORAL

## 2023-07-04 NOTE — ED Provider Notes (Signed)
 [name]    CSN: [id number] Arrival date & time: 07/04/23  3875      History   Chief Complaint Chief Complaint  Patient presents with   Abdominal Pain    HPI Shannon Dyer is a 43 y.o. female.   HPI 43 year old female presents with upper abdominal pain/epigastric for 2 days.  PMH significant for tobacco abuse, tubo-ovarian abscess, and panic attacks.  Patient reports taking omeprazole  as directed with little to no relief.  Past Medical History:  Diagnosis Date   Migraines    Panic attacks    TOA (tubo-ovarian abscess) 12/2018   Tobacco abuse     Patient Active Problem List   Diagnosis Date Noted   Eye twitch 08/28/2022   History of syncope 07/24/2022   Bradycardia 07/24/2022   Post concussion syndrome 04/13/2022   Acute insomnia 04/13/2022   Seizures (HCC) 03/25/2022   Complex regional pain syndrome i of right upper limb 09/13/2020   TOA (tubo-ovarian abscess), right 12/20/2018   BV (bacterial vaginosis) 12/20/2018   Periumbilical abdominal pain 12/19/2018   Migraines    Neck strain, initial encounter 11/27/2017   Headache 09/24/2017   Dizziness 09/24/2017   Skin lesion 09/24/2017   Smoker 08/11/2016   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   Dyspepsia 02/14/2016   Myalgia 11/21/2012   Encounter for routine gynecological examination 05/22/2012   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 problem 05/22/2012   Backache 02/16/2010   ANXIETY DEPRESSION 06/30/2008   GANGLION CYST, WRIST, LEFT 07/25/2007    Past Surgical History:  Procedure Laterality Date   cystic ovarian mass  01/2005   ELBOW LIGAMENT RECONSTRUCTION      OB History     Gravida  0   Para      Term      Preterm      AB      Living         SAB      IAB      Ectopic      Multiple      Live Births               Home Medications    Prior to Admission medications   Medication Sig Start Date End Date Taking? Authorizing Provider  pantoprazole (PROTONIX) 40 MG tablet Take 1  tablet (40 mg total) by mouth daily. 07/04/23 08/03/23 Yes Leonides Ramp, FNP  cyclobenzaprine  (FLEXERIL ) 10 MG tablet TAKE 1 TABLET BY MOUTH 2 TIMES DAILY AS NEEDED FOR MUSCLE SPASMS (AND SLEEP). CAUTION OF SEDATION 05/01/23   Tower, Manley Seeds, MD  gabapentin  (NEURONTIN ) 300 MG capsule Take 300 mg by mouth daily as needed. 03/26/22   [provider]  ondansetron  (ZOFRAN -ODT) 8 MG disintegrating tablet Take 1 tablet (8 mg total) by mouth every 8 (eight) hours as needed for nausea or vomiting. 06/06/23   Leonides Ramp, FNP  pregabalin  (LYRICA ) 150 MG capsule Take 150 mg by mouth daily. 02/23/22   [provider]  traZODone  (DESYREL ) 100 MG tablet TAKE 1 AND 1/2 TABLETS BY MOUTH AT BEDTIME 03/16/23   Tower, Manley Seeds, MD  verapamil  (CALAN -SR) 120 MG CR tablet Take 1 tablet (120 mg total) by mouth at bedtime. 08/02/22   Dohmeier, Raoul Byes, MD    Family History Family History  Problem Relation Age of Onset   Healthy Mother    Hypertension Father 36       died of unknown causes-autopsy   COPD Father    Asthma Sister  Breast cancer Neg Hx     Social History Social History   Tobacco Use   Smoking status: Former    Types: E-cigarettes   Smokeless tobacco: Never  Vaping Use   Vaping status: Every Day   Substances: Nicotine, Flavoring  Substance Use Topics   Alcohol use: Yes    Alcohol/week: 0.0 standard drinks of alcohol    Comment: occasionally   Drug use: No     Allergies   Patient has no known allergies.   Review of Systems Review of Systems   Physical Exam Triage Vital Signs ED Triage Vitals  Encounter Vitals Group     BP      Systolic BP Percentile      Diastolic BP Percentile      Pulse      Resp      Temp      Temp src      SpO2      Weight      Height      Head Circumference      Peak Flow      Pain Score      Pain Loc      Pain Education      Exclude from Growth Chart    No data found.  Updated Vital Signs BP 130/89 (BP Location: Right Arm)    Pulse (!) 108   Temp 98.8 F (37.1 C) (Oral)   Resp 18   Ht 5\' 3"  (1.6 m)   Wt 150 lb (68 kg)   LMP 06/22/2023 (Exact Date)   SpO2 99%   BMI 26.57 kg/m    Physical Exam Vitals and nursing note reviewed.  Constitutional:      Appearance: Normal appearance. She is normal weight.  HENT:     Head: Normocephalic and atraumatic.     Mouth/Throat:     Mouth: Mucous membranes are moist.     Pharynx: Oropharynx is clear.  Eyes:     Extraocular Movements: Extraocular movements intact.     Conjunctiva/sclera: Conjunctivae normal.     Pupils: Pupils are equal, round, and reactive to light.  Cardiovascular:     Rate and Rhythm: Normal rate and regular rhythm.     Pulses: Normal pulses.     Heart sounds: Normal heart sounds.  Pulmonary:     Effort: Pulmonary effort is normal.     Breath sounds: Normal breath sounds. No wheezing, rhonchi or rales.  Musculoskeletal:        General: Normal range of motion.     Cervical back: Normal range of motion and neck supple.  Skin:    General: Skin is warm and dry.  Neurological:     General: No focal deficit present.     Mental Status: She is alert and oriented to person, place, and time. Mental status is at baseline.      UC Treatments / Results  Labs (all labs ordered are listed, but only abnormal results are displayed) Labs Reviewed - No data to display  EKG   Radiology No results found.  Procedures Procedures (including critical care time)  Medications Ordered in UC Medications  alum & mag hydroxide-simeth (MAALOX/MYLANTA) 200-200-20 MG/5ML suspension 30 mL (30 mLs Oral Given 07/04/23 0941)    And  lidocaine  (XYLOCAINE ) 2 % viscous mouth solution 15 mL (15 mLs Oral Given 07/04/23 0941)    Initial Impression / Assessment and Plan / UC Course  I have reviewed the triage vital signs and the nursing  notes.  Pertinent labs & imaging results that were available during my care of the patient were reviewed by me and considered in my  medical decision making (see chart for details).     MDM: 1.  Gastroesophageal reflux disease, unspecified whether esophagitis present-GI cocktail provided once in clinic per patient request.  Rx'd Protonix 40 mg capsule: Take 1 capsule daily x 30 days.  Patient advised on how to take this medication properly. Advised patient to discontinue omeprazole  and start Protonix.  Advised patient to take medication on empty stomach with 8 to 12 ounces of water and no other medications or food for 45 minutes after taking this medication.  Advised if symptoms worsen and/or unresolved please follow-up with your PCP or GI for endoscopy and further evaluation of GERD symptoms.  Patient discharged home, hemodynamically stable. Final Clinical Impressions(s) / UC Diagnoses   Final diagnoses:  Gastroesophageal reflux disease, unspecified whether esophagitis present     Discharge Instructions      Advised patient to discontinue omeprazole  and start Protonix.  Advised patient to take medication on empty stomach with 8 to 12 ounces of water and no other medications or food for 45 minutes after taking this medication.  Advised if symptoms worsen and/or unresolved please follow-up with your PCP or GI for endoscopy and further evaluation of GERD symptoms.   ED Prescriptions     Medication Sig Dispense Auth. Provider   pantoprazole (PROTONIX) 40 MG tablet Take 1 tablet (40 mg total) by mouth daily. 30 tablet Berel Najjar, FNP      PDMP not reviewed this encounter.   Leonides Ramp, FNP 07/04/23 1038

## 2023-07-04 NOTE — Discharge Instructions (Addendum)
 Advised patient to discontinue omeprazole  and start Protonix.  Advised patient to take medication on empty stomach with 8 to 12 ounces of water and no other medications or food for 45 minutes after taking this medication.  Advised if symptoms worsen and/or unresolved please follow-up with your PCP or GI for endoscopy and further evaluation of GERD symptoms.

## 2023-07-04 NOTE — ED Triage Notes (Signed)
 Patient c/o upper epigastric abdominal pain x 2 days.  Patient denies any nausea, vomiting or diarrhea.  Afebrile.  No history of heartburn.  Concern for gastric ulcer.  Patient has tried TUMS and Pepto Bismol.

## 2023-07-12 ENCOUNTER — Encounter: Payer: Self-pay | Admitting: Family Medicine

## 2023-07-12 DIAGNOSIS — G90511 Complex regional pain syndrome I of right upper limb: Secondary | ICD-10-CM

## 2023-07-12 NOTE — Telephone Encounter (Signed)
 Generally this would be referred to a pain management center  Did he offer that ?   Not sure if I can take that over or not in light of controlled status  Will need a visit to discuss

## 2023-08-11 ENCOUNTER — Other Ambulatory Visit: Payer: Self-pay | Admitting: Family Medicine

## 2023-08-12 ENCOUNTER — Encounter: Payer: Self-pay | Admitting: Family Medicine

## 2023-08-13 NOTE — Telephone Encounter (Signed)
 Too early to fill I think  Is she taking it more often than twice daily ?

## 2023-08-13 NOTE — Telephone Encounter (Signed)
 Last filled on 05/01/23 #60 tab/ 3 refills   F/u scheduled on 08/27/23

## 2023-08-13 NOTE — Telephone Encounter (Signed)
Please have her schedule a follow up appointment.  Thanks.

## 2023-08-13 NOTE — Telephone Encounter (Signed)
 Unable to leave vm.

## 2023-08-15 NOTE — Telephone Encounter (Signed)
 Called pt and she said she was taking med sometimes TID due to pain. Pt did send a mychart letting pt know and asked for stronger med and PCP advised her to schedule f/u to discuss this. Pt does have a f/u scheduled on 08/27/23 for this issue  Called pharmacy and refill dates are as followed: 05/01/23 05/27/23 06/23/23 07/19/23

## 2023-08-27 ENCOUNTER — Encounter: Payer: Self-pay | Admitting: Family Medicine

## 2023-08-27 ENCOUNTER — Ambulatory Visit (INDEPENDENT_AMBULATORY_CARE_PROVIDER_SITE_OTHER): Payer: Self-pay | Admitting: Family Medicine

## 2023-08-27 VITALS — BP 126/82 | HR 103 | Temp 98.6°F | Ht 63.0 in | Wt 166.0 lb

## 2023-08-27 DIAGNOSIS — G90511 Complex regional pain syndrome I of right upper limb: Secondary | ICD-10-CM

## 2023-08-27 MED ORDER — DULOXETINE HCL 30 MG PO CPEP
30.0000 mg | ORAL_CAPSULE | Freq: Every day | ORAL | 1 refills | Status: DC
Start: 2023-08-27 — End: 2023-09-18

## 2023-08-27 NOTE — Progress Notes (Signed)
 Subjective:    Patient ID: YI FALLETTA, female    DOB: [birth year], 43 y.o.   MRN: [id number]  HPI  Wt Readings from Last 3 Encounters:  08/27/23 166 lb (75.3 kg)  07/04/23 150 lb (68 kg)  02/23/23 140 lb (63.5 kg)   29.41 kg/m  Vitals:   08/27/23 1112  BP: 126/82  Pulse: (!) 103  Temp: 98.6 F (37 C)  SpO2: 100%   Pt presents for c/o Chronic pain  To discuss other options (on flexeril ) before she can see pain management    History of complex regional pain syndrome of RUE Has had nerve/tendon surgery  4 surgeries on left elbow  Also  had stellate block (6 nerve blocks per pt) - Dr Ahmad did the last surgery)   Had discussed a nerve stimulator but pt is apprehensive   Is not using left hand for everything  Is disabled for right arm use   Pretty bad right elbow pain  She reached out to Dr Bonner   Tizanidine 4 mg bid from Dr Bonner  Cyclobenzaprine  10 mg tid prn from us   Lyrica  150 mg - refilled 90 pills on 08/05/23 from Dr Bonner - three times daily    Gabapentin  -past , did not work as well as lyrica   Lidocaine  patch-they do not help as well Naproxen -tried, it gave her GI side effects/chest pains (ibuprofen  with care)  K tape-now  Compression sleeve when not too hot  Tried bio freeze  Ice - can take it worse at times  Heat    Cymbalta  in past for depression 2019    Did get hydrocodone  from [facility] in past , they d/c this due fo ? Concerning behaviors regarding refills  Saw Dr Shari in the past   Trazodone  100 mg in past -stopped it  , did not work   Flexeril  takes tid   Has a history of seizure with concussion in past History of migraine     Pain management cannot be scheduled before 8/13 due to lawsuit /workman's comp    Patient Active Problem List   Diagnosis Date Noted   Eye twitch 08/28/2022   History of syncope 07/24/2022   Bradycardia 07/24/2022   Post concussion syndrome 04/13/2022   Acute insomnia 04/13/2022   Seizures  (HCC) 03/25/2022   Complex regional pain syndrome i of right upper limb 09/13/2020   TOA (tubo-ovarian abscess), right 12/20/2018   BV (bacterial vaginosis) 12/20/2018   Periumbilical abdominal pain 12/19/2018   Migraines    Neck strain, initial encounter 11/27/2017   Headache 09/24/2017   Dizziness 09/24/2017   Skin lesion 09/24/2017   Smoker 08/11/2016   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   Dyspepsia 02/14/2016   Myalgia 11/21/2012   Encounter for routine gynecological examination 05/22/2012   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 problem 05/22/2012   Backache 02/16/2010   ANXIETY DEPRESSION 06/30/2008   GANGLION CYST, WRIST, LEFT 07/25/2007   Past Medical History:  Diagnosis Date   Migraines    Panic attacks    TOA (tubo-ovarian abscess) 12/2018   Tobacco abuse    Past Surgical History:  Procedure Laterality Date   cystic ovarian mass  01/2005   ELBOW LIGAMENT RECONSTRUCTION     Social History   Tobacco Use   Smoking status: Former    Types: E-cigarettes   Smokeless tobacco: Never  Vaping Use   Vaping status: Every Day   Substances: Nicotine, Flavoring  Substance Use Topics   Alcohol use: Yes  Alcohol/week: 0.0 standard drinks of alcohol    Comment: occasionally   Drug use: No   Family History  Problem Relation Age of Onset   Healthy Mother    Hypertension Father 51       died of unknown causes-autopsy   COPD Father    Asthma Sister    Breast cancer Neg Hx    No Known Allergies Current Outpatient Medications on File Prior to Visit  Medication Sig Dispense Refill   pantoprazole  (PROTONIX ) 40 MG tablet Take 1 tablet (40 mg total) by mouth daily. 30 tablet 0   pregabalin  (LYRICA ) 150 MG capsule Take 150 mg by mouth daily. (Patient taking differently: Take 150 mg by mouth in the morning, at noon, and at bedtime.)     tiZANidine (ZANAFLEX) 4 MG capsule Take 4 mg by mouth in the morning and at bedtime.     ondansetron  (ZOFRAN -ODT) 8 MG disintegrating tablet Take 1 tablet (8  mg total) by mouth every 8 (eight) hours as needed for nausea or vomiting. (Patient not taking: Reported on 08/27/2023) 24 tablet 0   verapamil  (CALAN -SR) 120 MG CR tablet Take 1 tablet (120 mg total) by mouth at bedtime. 90 tablet 3   No current facility-administered medications on file prior to visit.    Review of Systems  Constitutional:  Negative for activity change, appetite change, fatigue, fever and unexpected weight change.  HENT:  Negative for congestion, ear pain, rhinorrhea, sinus pressure and sore throat.   Eyes:  Negative for pain, redness and visual disturbance.  Respiratory:  Negative for cough, shortness of breath and wheezing.   Cardiovascular:  Negative for chest pain and palpitations.  Gastrointestinal:  Negative for abdominal pain, blood in stool, constipation and diarrhea.  Endocrine: Negative for polydipsia and polyuria.  Genitourinary:  Negative for dysuria, frequency and urgency.  Musculoskeletal:  Positive for arthralgias. Negative for back pain and myalgias.       Mod to severe pain and disability of right elbow   Skin:  Negative for pallor and rash.  Allergic/Immunologic: Negative for environmental allergies.  Neurological:  Negative for dizziness, syncope and headaches.  Hematological:  Negative for adenopathy. Does not bruise/bleed easily.  Psychiatric/Behavioral:  Negative for decreased concentration and dysphoric mood. The patient is not nervous/anxious.        Objective:   Physical Exam Constitutional:      General: She is not in acute distress.    Appearance: Normal appearance. She is well-developed. She is obese. She is not ill-appearing or diaphoretic.  HENT:     Head: Normocephalic and atraumatic.  Eyes:     Conjunctiva/sclera: Conjunctivae normal.     Pupils: Pupils are equal, round, and reactive to light.  Neck:     Thyroid: No thyromegaly.     Vascular: No carotid bruit or JVD.  Cardiovascular:     Rate and Rhythm: Normal rate and regular  rhythm.     Heart sounds: Normal heart sounds.     No gallop.  Pulmonary:     Effort: Pulmonary effort is normal. No respiratory distress.     Breath sounds: Normal breath sounds. No wheezing or rales.  Abdominal:     General: There is no distension or abdominal bruit.     Palpations: Abdomen is soft.  Musculoskeletal:     Cervical back: Normal range of motion and neck supple.     Right lower leg: No edema.     Left lower leg: No edema.  Comments: Limited rom right elbow Wearing K tape Very tender to touch medially   No swelling or redness Scars noted   Lymphadenopathy:     Cervical: No cervical adenopathy.  Skin:    General: Skin is warm and dry.     Coloration: Skin is not pale.     Findings: No rash.  Neurological:     Mental Status: She is alert.     Coordination: Coordination normal.     Deep Tendon Reflexes: Reflexes are normal and symmetric. Reflexes normal.  Psychiatric:        Mood and Affect: Mood normal.           Assessment & Plan:   Problem List Items Addressed This Visit       Nervous and Auditory   Complex regional pain syndrome i of right upper limb - Primary   Ongoing  Workman's comp 4 surgeries and some nerve blocks on right elbow Under care of Dr Bonner and Dr Ahmad Solo lyrica  150 mg tid  Tizanadine (works better than flexeril  so we will stop that) Intol of naproxen -instructed to try voltaren  gel over the counter K tape  No improvement with lidocaine  patch or ice   Will add cymbalta  30 mg (if helpful increase to 60 in 2-3 wk)  Has taken in past for mood but not pain  Discussed expectations of SNRI medication including time to effectiveness and mechanism of action, also poss of side effects (early and late)- including mental fuzziness, weight or appetite change, nausea and poss of worse dep or anxiety (even suicidal thoughts)  Pt voiced understanding and will stop med and update if this occurs    Pt will call to get appointment  with pain management  Reviewed records from [facility]          Relevant Medications   tiZANidine (ZANAFLEX) 4 MG capsule   DULoxetine  (CYMBALTA ) 30 MG capsule

## 2023-08-27 NOTE — Assessment & Plan Note (Signed)
 Ongoing  Workman's comp 4 surgeries and some nerve blocks on right elbow Under care of Dr Bonner and Dr Ahmad Continues lyrica  150 mg tid  Tizanadine (works better than flexeril  so we will stop that) Intol of naproxen -instructed to try voltaren  gel over the counter K tape  No improvement with lidocaine  patch or ice   Will add cymbalta  30 mg (if helpful increase to 60 in 2-3 wk)  Has taken in past for mood but not pain  Discussed expectations of SNRI medication including time to effectiveness and mechanism of action, also poss of side effects (early and late)- including mental fuzziness, weight or appetite change, nausea and poss of worse dep or anxiety (even suicidal thoughts)  Pt voiced understanding and will stop med and update if this occurs    Pt will call to get appointment with pain management  Reviewed records from [facility]

## 2023-08-27 NOTE — Patient Instructions (Addendum)
 Call pain management asap to get that appointment when you can   Get voltaren  (diclofenac ) gel 1% over the counter  You can apply this to the painful area 4 times daily  This replaces the naproxen  or ibuprofen     Continue tizanidine for muscle spasm/pain  Stop the cyclobenzaprine  -it is in the same class  Let's try an anti depressant that is used for pain called cymbalta   Start with 30 mg daily in the evening  If well tolerated in 2-3 weeks let us  know and we can go to up to 60   Message or call in 2-3 weeks with an update   If any intolerable side effects or it makes you worse (mood wise) please hold it and let us  know   Stay on lyrica    Goal is to get you through to pain management visit

## 2023-09-11 ENCOUNTER — Other Ambulatory Visit: Payer: Self-pay | Admitting: Family Medicine

## 2023-09-18 ENCOUNTER — Other Ambulatory Visit: Payer: Self-pay | Admitting: Family Medicine

## 2023-12-14 ENCOUNTER — Other Ambulatory Visit: Payer: Self-pay | Admitting: Family Medicine

## 2023-12-14 NOTE — Telephone Encounter (Signed)
 Last filled on 09/18/23 #90 caps/ 0 refills   Last f/u was on 08/27/23

## 2024-02-12 ENCOUNTER — Encounter: Payer: Self-pay | Admitting: Family Medicine
